# Patient Record
Sex: Male | Born: 1940 | Race: White | Hispanic: No | Marital: Married | Smoking: Former smoker
Health system: Southern US, Community
[De-identification: ages and names within clinical notes are randomized; demographics above are authoritative.]

## PROBLEM LIST (undated history)

## (undated) DIAGNOSIS — R55 Syncope and collapse: Secondary | ICD-10-CM

## (undated) DIAGNOSIS — I5189 Other ill-defined heart diseases: Secondary | ICD-10-CM

## (undated) DIAGNOSIS — C801 Malignant (primary) neoplasm, unspecified: Secondary | ICD-10-CM

## (undated) DIAGNOSIS — I10 Essential (primary) hypertension: Secondary | ICD-10-CM

## (undated) DIAGNOSIS — E785 Hyperlipidemia, unspecified: Secondary | ICD-10-CM

## (undated) DIAGNOSIS — I499 Cardiac arrhythmia, unspecified: Secondary | ICD-10-CM

## (undated) DIAGNOSIS — Z950 Presence of cardiac pacemaker: Secondary | ICD-10-CM

## (undated) DIAGNOSIS — Z85038 Personal history of other malignant neoplasm of large intestine: Secondary | ICD-10-CM

## (undated) DIAGNOSIS — M1990 Unspecified osteoarthritis, unspecified site: Secondary | ICD-10-CM

## (undated) DIAGNOSIS — I495 Sick sinus syndrome: Secondary | ICD-10-CM

## (undated) HISTORY — DX: Syncope and collapse: R55

## (undated) HISTORY — DX: Other ill-defined heart diseases: I51.89

## (undated) HISTORY — DX: Presence of cardiac pacemaker: Z95.0

## (undated) HISTORY — DX: Personal history of other malignant neoplasm of large intestine: Z85.038

## (undated) HISTORY — PX: CATARACT EXTRACTION W/ INTRAOCULAR LENS IMPLANT: SHX1309

## (undated) HISTORY — PX: OTHER SURGICAL HISTORY: SHX169

## (undated) HISTORY — DX: Unspecified osteoarthritis, unspecified site: M19.90

## (undated) HISTORY — DX: Sick sinus syndrome: I49.5

## (undated) HISTORY — DX: Hyperlipidemia, unspecified: E78.5

## (undated) HISTORY — DX: Essential (primary) hypertension: I10

## (undated) HISTORY — PX: INSERT / REPLACE / REMOVE PACEMAKER: SUR710

---

## 1970-05-04 HISTORY — PX: OTHER SURGICAL HISTORY: SHX169

## 1999-05-05 HISTORY — PX: COLOSTOMY: SHX63

## 2000-02-20 ENCOUNTER — Encounter: Admission: RE | Admit: 2000-02-20 | Discharge: 2000-05-20 | Payer: Self-pay

## 2006-08-10 ENCOUNTER — Encounter: Admission: RE | Admit: 2006-08-10 | Discharge: 2006-11-08 | Payer: Self-pay | Admitting: Family Medicine

## 2007-11-02 HISTORY — PX: KNEE ARTHROPLASTY: SHX992

## 2007-11-08 ENCOUNTER — Inpatient Hospital Stay (HOSPITAL_COMMUNITY): Admission: RE | Admit: 2007-11-08 | Discharge: 2007-11-11 | Payer: Self-pay | Admitting: Orthopedic Surgery

## 2007-12-05 ENCOUNTER — Encounter: Admission: RE | Admit: 2007-12-05 | Discharge: 2008-02-03 | Payer: Self-pay | Admitting: Orthopedic Surgery

## 2008-12-04 ENCOUNTER — Ambulatory Visit: Payer: Self-pay | Admitting: Internal Medicine

## 2009-01-03 ENCOUNTER — Ambulatory Visit: Payer: Self-pay | Admitting: Interventional Radiology

## 2009-01-03 ENCOUNTER — Encounter: Payer: Self-pay | Admitting: Emergency Medicine

## 2009-01-04 ENCOUNTER — Encounter (INDEPENDENT_AMBULATORY_CARE_PROVIDER_SITE_OTHER): Payer: Self-pay | Admitting: Internal Medicine

## 2009-01-04 ENCOUNTER — Inpatient Hospital Stay (HOSPITAL_COMMUNITY): Admission: EM | Admit: 2009-01-04 | Discharge: 2009-01-05 | Payer: Self-pay | Admitting: Internal Medicine

## 2009-01-04 ENCOUNTER — Ambulatory Visit: Payer: Self-pay | Admitting: Internal Medicine

## 2009-01-05 ENCOUNTER — Encounter: Payer: Self-pay | Admitting: Internal Medicine

## 2009-01-08 ENCOUNTER — Encounter: Payer: Self-pay | Admitting: Internal Medicine

## 2009-01-17 ENCOUNTER — Ambulatory Visit: Payer: Self-pay | Admitting: Internal Medicine

## 2009-01-17 ENCOUNTER — Ambulatory Visit: Payer: Self-pay

## 2009-01-22 ENCOUNTER — Telehealth (INDEPENDENT_AMBULATORY_CARE_PROVIDER_SITE_OTHER): Payer: Self-pay | Admitting: *Deleted

## 2009-01-23 ENCOUNTER — Ambulatory Visit: Payer: Self-pay

## 2009-01-23 ENCOUNTER — Encounter: Payer: Self-pay | Admitting: Cardiovascular Disease

## 2009-04-16 ENCOUNTER — Ambulatory Visit: Payer: Self-pay | Admitting: Internal Medicine

## 2009-04-16 DIAGNOSIS — I495 Sick sinus syndrome: Secondary | ICD-10-CM | POA: Insufficient documentation

## 2009-11-19 ENCOUNTER — Telehealth: Payer: Self-pay | Admitting: Internal Medicine

## 2010-01-23 ENCOUNTER — Ambulatory Visit: Payer: Self-pay | Admitting: Internal Medicine

## 2010-01-23 DIAGNOSIS — R55 Syncope and collapse: Secondary | ICD-10-CM | POA: Insufficient documentation

## 2010-02-04 ENCOUNTER — Ambulatory Visit: Payer: Self-pay

## 2010-02-04 ENCOUNTER — Ambulatory Visit: Payer: Self-pay | Admitting: Cardiology

## 2010-02-04 ENCOUNTER — Ambulatory Visit (HOSPITAL_COMMUNITY): Admission: RE | Admit: 2010-02-04 | Discharge: 2010-02-04 | Payer: Self-pay | Admitting: Internal Medicine

## 2010-02-04 ENCOUNTER — Encounter: Payer: Self-pay | Admitting: Internal Medicine

## 2010-03-11 ENCOUNTER — Telehealth: Payer: Self-pay | Admitting: Internal Medicine

## 2010-04-24 ENCOUNTER — Ambulatory Visit: Payer: Self-pay | Admitting: Internal Medicine

## 2010-04-29 ENCOUNTER — Encounter: Payer: Self-pay | Admitting: Internal Medicine

## 2010-05-08 ENCOUNTER — Ambulatory Visit
Admission: RE | Admit: 2010-05-08 | Discharge: 2010-05-08 | Payer: Self-pay | Source: Home / Self Care | Attending: Internal Medicine | Admitting: Internal Medicine

## 2010-05-08 ENCOUNTER — Encounter: Payer: Self-pay | Admitting: Internal Medicine

## 2010-05-08 DIAGNOSIS — I10 Essential (primary) hypertension: Secondary | ICD-10-CM | POA: Insufficient documentation

## 2010-06-01 LAB — CONVERTED CEMR LAB
BUN: 22 mg/dL (ref 6–23)
Basophils Absolute: 0 10*3/uL (ref 0.0–0.1)
Basophils Relative: 0.5 % (ref 0.0–3.0)
CO2: 28 meq/L (ref 19–32)
Calcium: 9.6 mg/dL (ref 8.4–10.5)
Chloride: 103 meq/L (ref 96–112)
Creatinine, Ser: 1.1 mg/dL (ref 0.4–1.5)
Eosinophils Absolute: 0.1 10*3/uL (ref 0.0–0.7)
Eosinophils Relative: 2.1 % (ref 0.0–5.0)
GFR calc non Af Amer: 70.96 mL/min (ref >60)
Glucose, Bld: 148 mg/dL — ABNORMAL HIGH (ref 70–99)
HCT: 42.2 % (ref 39.0–52.0)
Hemoglobin: 14.4 g/dL (ref 13.0–17.0)
Lymphocytes Relative: 15.5 % (ref 12.0–46.0)
Lymphs Abs: 1.1 10*3/uL (ref 0.7–4.0)
MCHC: 34.2 g/dL (ref 30.0–36.0)
MCV: 91 fL (ref 78.0–100.0)
Monocytes Absolute: 0.7 10*3/uL (ref 0.1–1.0)
Monocytes Relative: 9.3 % (ref 3.0–12.0)
Neutro Abs: 5.1 10*3/uL (ref 1.4–7.7)
Neutrophils Relative %: 72.6 % (ref 43.0–77.0)
Platelets: 175 10*3/uL (ref 150.0–400.0)
Potassium: 4.1 meq/L (ref 3.5–5.1)
RBC: 4.64 M/uL (ref 4.22–5.81)
RDW: 12.6 % (ref 11.5–14.6)
Sodium: 139 meq/L (ref 135–145)
TSH: 2.19 microintl units/mL (ref 0.35–5.50)
WBC: 7.1 10*3/uL (ref 4.5–10.5)

## 2010-06-03 NOTE — Progress Notes (Signed)
Summary: Calling regarding pacer machine  Phone Note Outgoing Call Call back at [facility] Phone [phone]   Caller: Patient Summary of Call: Pt calling regarding a pacer machine Initial call taken by: Judie Grieve,  November 19, 2009 1:27 PM Summary of Call: SPOKE W/PT --PT NEVER RECEIVED [facility] TRANSMITTER.  ENROLLED PT IN [facility] AND PT SHOULD RECEIVE TRANSMITTER IN NEXT 2 WKS.  PT DUE FOR SK SEPT. PT AWARE. Vella Kohler  November 20, 2009 4:40 PM

## 2010-06-03 NOTE — Assessment & Plan Note (Signed)
Summary: Cardiology Nuclear Study  Nuclear Med Background Indications for Stress Test: Evaluation for Ischemia   History: Echo, Myocardial Perfusion Study, Pacemaker  History Comments: '08 MPS: NL- [facility] Cardiology) 01/04/09 Pacemaker (SSS) 9/10 Echo: EF= 60-65%  Symptoms: Light-Headedness, Palpitations, Syncope    Nuclear Pre-Procedure Cardiac Risk Factors: Family History - CAD, Hypertension, Lipids, NIDDM Caffeine/Decaff Intake: none NPO After: 7:00 PM Lungs: clear IV 0.9% NS with Angio Cath: 22g     IV Site: (R) AC IV Started by: Irean Hong RN Chest Size (in) 44     Height (in): 67 Weight (lb): 195 BMI: 30.65  Nuclear Med Study 1 or 2 day study:  1 day     Stress Test Type:  Adenosine Reading MD:  Charlton Haws, MD     Referring MD:  S.Klein Resting Radionuclide:  Technetium 78m Tetrofosmin     Resting Radionuclide Dose:  11 mCi  Stress Radionuclide:  Technetium 80m Tetrofosmin     Stress Radionuclide Dose:  33 mCi   Stress Protocol  Dose of Adenosine:  49.6 mg    Stress Test Technologist:  Milana Na EMT-P     Nuclear Technologist:  Domenic Polite CNMT  Rest Procedure  Myocardial perfusion imaging was performed at rest 45 minutes following the intraveneous administration of Myoview Technetium 25m Tetrofosmin.  Stress Procedure  The patient received IV adenosine at 140 mcg/kg/min for 4 minutes. 2nd degree AVB with infusion. There were no significant changes and rare pacs with infusion. Myoview was injected at the 2 minute mark and quantitative spect images were obtained after a 45 minute delay.  QPS Raw Data Images:  Normal; no motion artifact; normal heart/lung ratio. Stress Images:  There is normal uptake in all areas. Rest Images:  Normal homogeneous uptake in all areas of the myocardium. Subtraction (SDS):  Normal Transient Ischemic Dilatation:  1.09  (Normal <1.22)  Lung/Heart Ratio:  .32  (Normal <0.45)  Quantitative Gated Spect  Images QGS EDV:  84 ml QGS ESV:  33 ml QGS EF:  60 % QGS cine images:  Normal  Findings Normal nuclear study      Overall Impression  Exercise Capacity: Adenosine study with no exercise. BP Response: Normal blood pressure response. Clinical Symptoms: No chest pain ECG Impression: No significant ST segment change suggestive of ischemia. Overall Impression: Normal stress nuclear study. Overall Impression Comments: Normal  Appended Document: Cardiology Nuclear Study Pt notified of normal results

## 2010-06-03 NOTE — Progress Notes (Signed)
Summary: results of echo-has questions  Phone Note Call from Patient   Caller: Patient [phone] Reason for Call: Talk to Nurse, Lab or Test Results Summary of Call: pt needs results of echo-also primary dr hasn't rec report which pt requested be sent at his appt, [facility] primary care-dr Cordelia Pen [phone], also p has questions re appt with pcp  Initial call taken by: Glynda Jaeger,  March 11, 2010 9:27 AM  Follow-up for Phone Call        fax'd results per pt request, scheduled f/u appt for 1/5 and have prescriptions for pt to pick up for beta blockers to try to control heart rate.  Follow-up by: Claris Gladden RN,  March 11, 2010 9:55 AM    New/Updated Medications: PROPRANOLOL HCL 60 MG TABS (PROPRANOLOL HCL) once daily ATENOLOL 25 MG TABS (ATENOLOL) once daily METOPROLOL SUCCINATE 25 MG XR24H-TAB (METOPROLOL SUCCINATE) once daily Prescriptions: METOPROLOL SUCCINATE 25 MG XR24H-TAB (METOPROLOL SUCCINATE) once daily  #14 x 0   Entered by:   Claris Gladden RN   Authorized by:   Nathen May, MD, [facility]   Signed by:   Claris Gladden RN on 03/11/2010   Method used:   Print then Give to Patient   RxID:   [id number] PROPRANOLOL HCL 60 MG TABS (PROPRANOLOL HCL) once daily  #14 x 0   Entered by:   Claris Gladden RN   Authorized by:   Nathen May, MD, [facility]   Signed by:   Claris Gladden RN on 03/11/2010   Method used:   Print then Give to Patient   RxID:   [id number] ATENOLOL 25 MG TABS (ATENOLOL) once daily  #14 x 0   Entered by:   Claris Gladden RN   Authorized by:   Nathen May, MD, [facility]   Signed by:   Claris Gladden RN on 03/11/2010   Method used:   Print then Give to Patient   RxID:   [id number]

## 2010-06-03 NOTE — Cardiovascular Report (Signed)
Summary: Office Visit   Office Visit   Imported By: Roderic Ovens 01/28/2010 15:08:09  _____________________________________________________________________  External Attachment:    Type:   Image     Comment:   External Document

## 2010-06-03 NOTE — Assessment & Plan Note (Signed)
Summary: 9 MO F/U   Visit Type:  Follow-up Primary Provider:  Nathen May, MD, [facility]   History of Present Illness: Joshua Ali is seen following pacemaker implantation earlier this fall because of syncope with documented sinus arrest of greater than 30 seconds.     He had undergone echo at that time demonstrating 1. Left ventricle: Systolic function was normal. The estimated      ejection fraction was in the range of 60% to 65%.  The patient denies SOB, chest pain, edema or palpitations He has done very well since then. He denies any specific palpitations last Thursday (see below) and a couple of twitches but overall things are doing very well  He denies diaphoresis wt loss or alopecia.  His exercise tolerance remains imporved  Current Medications (verified): 1)  Metformin Hcl 1000 Mg Tabs (Metformin Hcl) .... One By Mouth Bid 2)  Hyzaar 100-25 Mg Tabs (Losartan Potassium-Hctz) .... One By Mouth Daily 3)  Simvastatin 40 Mg Tabs (Simvastatin) .... On By Mouth Daily 4)  Mobic 15 Mg Tabs (Meloxicam) .... One By Mouth Daily 5)  Aspir-Low 81 Mg Tbec (Aspirin) .... One By Mouth Daily 6)  Fish Oil 1000 Mg Caps (Omega-3 Fatty Acids) .... One By Mouth Daily 7)  Caltrate 600+d 600-400 Mg-Unit Tabs (Calcium Carbonate-Vitamin D) .... One  By Mouth Daily 8)  Zyrtec Allergy 10 Mg Tabs (Cetirizine Hcl) .... Once Daily 9)  Flonase 50 Mcg/act Susp (Fluticasone Propionate) .... Uad 10)  Hydrocodone-Acetaminophen 7.5-750 Mg Tabs (Hydrocodone-Acetaminophen) .... Uad 11)  Eql Vitamin D3 1000 Unit Tabs (Cholecalciferol) .... Once Daily 12)  Multivitamins   Tabs (Multiple Vitamin) .... Once Daily  Allergies (verified): 1)  ! Percocet  Past History:  Past Medical History: Last updated: 04/15/2009 Syncope Sick sinus syndrome Status post dual-chamber permanent pacemaker implant.  [employer] DR Satira Sark. Jude pulse   generator model PMS 2110 Preserved ejection fraction of 60-65%-2010 Diabetes type  2 Hyperlipidemia History of arthritis History of hypertension History of colon cancer approximately 10 years ago Chronic diastolic dysfunction  Vital Signs:  Patient profile:   70 year old male Height:      67 inches Weight:      192 pounds BMI:     30.18 Pulse rate:   95 / minute BP sitting:   136 / 86  (left arm)  Vitals Entered By: Joshua Ali CMA (January 23, 2010 2:41 PM)  Physical Exam  General:  The patient was alert and oriented in no acute distress. HEENT Normal.  Neck veins were flat, carotids were brisk.  Lungs were clear.  Heart sounds were regular but rapid without murmurs or gallops.  Abdomen was soft with active bowel sounds. There is no clubbing cyanosis or edema. Skin Warm and dry    PPM Specifications Following MD:  Sherryl Manges, MD     PPM Vendor:  St Jude     PPM Model Number:  [id number]     PPM Serial Number:  [id number] DOI:  01/04/2009     PPM Implanting MD:  Sherryl Manges, MD  Lead 1    Location: RA     DOI: 01/04/2009     Model #: [id number]     Serial #: [id number]     Status: active Lead 2    Location: RV     DOI: 01/04/2009     Model #: [id number]     Serial #: [id number]     Status: active  Magnet Response Rate:  BOL 100 ERI  85    PPM Follow Up Battery Voltage:  2.98 V     Battery Est. Longevity:  10.1-11.0 yrs     Pacer Dependent:  No       PPM Device Measurements Atrium  Amplitude: 5.0 mV, Impedance: 430 ohms, Threshold: 0.375 V at 0.5 msec Right Ventricle  Amplitude: 12.0 mV, Impedance: 530 ohms, Threshold: 0.875 V at 0.5 msec  Episodes MS Episodes:  25     Percent Mode Switch:  <1%     Coumadin:  No Ventricular High Rate:  49     Atrial Pacing:  <1%     Ventricular Pacing:  <1%  Parameters Mode:  DDD     Lower Rate Limit:  60     Upper Rate Limit:  115 Paced AV Delay:  225     Sensed AV Delay:  225 Next Remote Date:  04/24/2010     Next Cardiology Appt Due:  01/05/2011 Tech Comments:  25 AMS EPISODES--LONGEST WAS 3 HRS 45 MINUTES. 49 VHR  EPISODES.  NORMAL DEVICE FUNCTION.  NO CHANGES MADE.  MERLIN TRANSMISSION 04-24-10. ROV IN 12 MTHS W/SK. Vella Kohler  January 23, 2010 3:25 PM  Impression & Recommendations:  Problem # 1:  SINUS NODE DYSFUNCTION (ICD-427.81) The patient has a rapid sinus rate today. His mean rate is 90 beats per minute. Review of the data from a year ago was that his heart rate was 68. Nursing records from a year ago had one heart rate in the 102 and the others were in the 70s.  The issue is why does he have sinus tachycardia. Will check his thyroid white count and metabolic profile. Will also undertake an echo to look to see if there is any consequences manifested as LV dysfunction or potentially identifiable activity caused to the tachycardia His updated medication list for this problem includes:    Aspir-low 81 Mg Tbec (Aspirin) ..... 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 His updated medication list for this problem includes:    Aspir-low 81 Mg Tbec (Aspirin) ..... One by mouth daily  Problem # 4:  VENTRICULAR TACHYCARDIA (ICD-427.1) ventricular high rate episodes alternatively sinus rhythm. I should note that there is a unusual variation and yellow to him of the atrial signal seen at various rates. Is not sufficiently irregular to be suggestive of MAT His updated medication list for this problem includes:    Aspir-low 81 Mg Tbec (Aspirin) ..... One by mouth daily  Orders: TLB-BMP (Basic Metabolic  Panel-BMET) (80048-METABOL) TLB-CBC Platelet - w/Differential (85025-CBCD) TLB-TSH (Thyroid Stimulating Hormone) (16109-UEA)  Patient Instructions: 1)  Your physician recommends that you have lab work today: TSH, BMET, & CBC.

## 2010-06-03 NOTE — Assessment & Plan Note (Signed)
Summary: PC2/KFW   History of Present Illness: [name] is seen following pacemaker implantation earlier this fall because of syncope with documented sinus arrest of greater than 30 seconds.  He had undergone echo at that time demonstrating 1. Left ventricle: Systolic function was normal. The estimated      ejection fraction was in the range of 60% to 65%.  The patient denies SOB, chest pain, edema or palpitations He has done very well since then. He denies any specific palpitations last Thursday (see below) and a couple of twitches but overall things are doing very well   Current Medications (verified): 1)  Metformin Hcl 1000 Mg Tabs (Metformin Hcl) .... One By Mouth Bid 2)  Hyzaar 100-25 Mg Tabs (Losartan Potassium-Hctz) .... One By Mouth Daily 3)  Simvastatin 40 Mg Tabs (Simvastatin) .... On By Mouth Daily 4)  Mobic 15 Mg Tabs (Meloxicam) .... One By Mouth Daily 5)  Aspir-Low 81 Mg Tbec (Aspirin) .... One By Mouth Daily 6)  Fish Oil 1000 Mg Caps (Omega-3 Fatty Acids) .... One By Mouth Daily 7)  Caltrate 600+d 600-400 Mg-Unit Tabs (Calcium Carbonate-Vitamin D) .... One  By Mouth Daily 8)  Claritin-D 24 Hour 10-240 Mg Xr24h-Tab (Loratadine-Pseudoephedrine) .... One By Mouth Daily  Allergies (verified): No Known Drug Allergies  Past History:  Past Medical History: Last updated: 04/15/2009 Syncope Sick sinus syndrome Status post dual-chamber permanent pacemaker implant.  Acc[employer] DR Satira Sark. Jude pulse   generator model PMS 2110 Preserved ejection fraction of 60-65%-2010 Diabetes type 2 Hyperlipidemia History of arthritis History of hypertension History of colon cancer approximately 10 years ago Chronic diastolic dysfunction  Past Surgical History: Last updated: 04/15/2009 Right total knee replacement permanent pacemaker implant-[employer] DR St. Jude pulse   generator model PMS 2110  Social History: Last updated: 04/15/2009 Married  Tobacco Use - No.  Alcohol Use - no Drug Use  - no  Vital Signs:  Patient profile:   70 year old male Height:      67 inches Weight:      196 pounds BMI:     30.81 Pulse rate:   68 / minute Pulse rhythm:   regular BP sitting:   162 / 100  (left arm) Cuff size:   regular  Vitals Entered By: Judithe Modest CMA (April 16, 2009 10:41 AM)  Physical Exam  General:  The patient was alert and oriented in no acute distress. HEENT Normal.  Neck veins were flat, carotids were brisk.  Lungs were clear.  Heart sounds were regular without murmurs or gallops.  Abdomen was soft with active bowel sounds. There is no clubbing cyanosis or edema. Skin Warm and dry Device pocket is well-healed   PPM Specifications Following MD:  Sherryl Manges, MD     PPM Vendor:  St Jude     PPM Model Number:  [id number]     PPM Serial Number:  [id number] DOI:  01/04/2009     PPM Implanting MD:  Sherryl Manges, MD  Lead 1    Location: RA     DOI: 01/04/2009     Model #: [id number]     Serial #: [id number]     Status: active Lead 2    Location: RV     DOI: 01/04/2009     Model #: [id number]     Serial #: [id number]     Status: active  Magnet Response Rate:  BOL 100 ERI  85    PPM Follow Up Remote Check?  No Battery  Voltage:  2.99 V     Battery Est. Longevity:  11.9 YEARS     Pacer Dependent:  No       PPM Device Measurements Atrium  Amplitude: 5 mV, Impedance: 480 ohms, Threshold: 0.5 V at 0.5 msec Right Ventricle  Amplitude: 12 mV, Impedance: 550 ohms, Threshold: 0.75 V at 0.5 msec  Episodes MS Episodes:  13     Percent Mode Switch:  <1%     Coumadin:  No Ventricular High Rate:  3     Atrial Pacing:  <1%     Ventricular Pacing:  <1%  Parameters Mode:  DDD     Lower Rate Limit:  60     Upper Rate Limit:  115 Paced AV Delay:  225     Sensed AV Delay:  225 Next Cardiology Appt Due:  01/02/2010 Tech Comments:  Normal device function.  No changes made today.  RA and RV autocapture on.  3 VHR episodes, 2 are likley SVT, 1 is NSVT- 20 beats at . Mode switch  episodes are 1:1 atrial tachycardia.  No changes made today.  ROV 9-11 SK. 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 There have however been no recurrent syncopal episodes. Exercise tolerance good continue current meds His updated medication list for this problem includes:    Aspir-low 81 Mg Tbec (Aspirin) ..... 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 There are no symptoms associated with this episode. We'll continue to watch it His updated medication list for this problem includes:    Aspir-low 81 Mg Tbec (Aspirin) ..... One by mouth daily  Patient Instructions: 1)  Your physician recommends that you schedule a follow-up appointment in: 9 MONTHS

## 2010-06-03 NOTE — Progress Notes (Signed)
Summary: Nuclear Pre-Procedure  Phone Note Outgoing Call Call back at [facility] Phone [phone]   Call placed by: Stanton Kidney, EMT-P,  January 22, 2009 2:39 PM Action Taken: Phone Call Completed Summary of Call: Left message with information on Myoview Information Sheet (see scanned document for details).     Nuclear Med Background Indications for Stress Test: Evaluation for Ischemia   History: Echo, Myocardial Perfusion Study, Pacemaker  History Comments: '08 MPS: NL- [facility] Cardiology) 01/04/09 Pacemaker (SSS) 9/10 Echo: EF= 60-65%  Symptoms: Light-Headedness, Syncope    Nuclear Pre-Procedure Cardiac Risk Factors: Family History - CAD, Hypertension, Lipids, NIDDM

## 2010-06-03 NOTE — Cardiovascular Report (Signed)
Summary: Office Visit  Office Visit   Imported By: Roderic Ovens 01/31/2009 16:21:40  _____________________________________________________________________  External Attachment:    Type:   Image     Comment:   External Document

## 2010-06-03 NOTE — Assessment & Plan Note (Signed)
Summary: Patient Instructions    Patient Instructions: 1)  Your physician has requested that you have an echocardiogram.  Echocardiography is a painless test that uses sound waves to create images of your heart. It provides your doctor with information about the size and shape of your heart and how well your heart's chambers and valves are working.  This procedure takes approximately one hour. There are no restrictions for this procedure. 2)  Your physician recommends that you have TSH, CBC, BMET today. 3)  Your physician recommends that you continue on your current medications as directed. Please refer to the Current Medication list given to you today. 4)  Your physician wants you to follow-up in: 1 year   You will receive a reminder letter in the mail two months in advance. If you don't receive a letter, please call our office to schedule the follow-up appointment.

## 2010-06-03 NOTE — Procedures (Signed)
Summary: [facility]   Current Medications (verified): 1)  Metformin Hcl 1000 Mg Tabs (Metformin Hcl) .... One By Mouth Bid 2)  Hyzaar 100-25 Mg Tabs (Losartan Potassium-Hctz) .... One By Mouth Daily 3)  Simvastatin 40 Mg Tabs (Simvastatin) .... On By Mouth Daily 4)  Mobic 15 Mg Tabs (Meloxicam) .... One By Mouth Daily 5)  Aspir-Low 81 Mg Tbec (Aspirin) .... One By Mouth Daily 6)  Fish Oil 1000 Mg Caps (Omega-3 Fatty Acids) .... One By Mouth Daily 7)  Caltrate 600+d 600-400 Mg-Unit Tabs (Calcium Carbonate-Vitamin D) .... One  By Mouth Daily 8)  Claritin-D 24 Hour 10-240 Mg Xr24h-Tab (Loratadine-Pseudoephedrine) .... One By Mouth Daily  Allergies (verified): No Known Drug Allergies  PPM Specifications Following MD:  Sherryl Manges, MD     PPM Vendor:  St Jude     PPM Model Number:  [phone]     PPM Serial Number:  [id number] PPM DOI:  01/04/2009     PPM Implanting MD:  Sherryl Manges, MD  Lead 1    Location: RA     DOI: 01/04/2009     Model #: [id number]     Serial #: [id number]     Status: active Lead 2    Location: RV     DOI: 01/04/2009     Model #: [id number]     Serial #: [id number]     Status: active  Magnet Response Rate:  BOL 100 ERI  85    PPM Follow Up Remote Check?  No Battery Voltage:  3.05 V     Battery Est. Longevity:  12.2 years     Pacer Dependent:  Yes       PPM Device Measurements Atrium  Amplitude: >5 mV, Impedance: 530 ohms, Threshold: 0.5 V at 0.5 msec Right Ventricle  Amplitude: >12 mV, Impedance: 630 ohms, Threshold: 0.62 V at 0.5 msec  Episodes MS Episodes:  0     Percent Mode Switch:  0     Coumadin:  No Ventricular High Rate:  0     Atrial Pacing:  <1%     Ventricular Pacing:  <1%  Parameters Mode:  DDD     Lower Rate Limit:  60     Upper Rate Limit:  115 Paced AV Delay:  225     Sensed AV Delay:  225 Next Cardiology Appt Due:  04/03/2009 Tech Comments:  No parameter changes.   Steri strips removed, no redness or edema.   He will follow up in December with Dr.  Graciela Husbands. Altha Harm, LPN  January 17, 2009 12:36 PM

## 2010-06-05 NOTE — Assessment & Plan Note (Signed)
Summary: rov-   Primary Provider:  Nathen May, MD, [facility]   History of Present Illness: Joshua Ali is seen following pacemaker implantation earlier this fall because of syncope with documented sinus arrest of greater than 30 seconds.  His had no subsequent syncope.   He had undergone echo at that time demonstrating 1. Left ventricle: Systolic function was normal. The estimated      ejection fraction was in the range of 60% to 65%.  The patient denies SOB, chest pain, edema or palpitations He has done very well since then. He denies any specific palpitations last Thursday (see below) and a couple of twitches but overall things are doing very well He was last seen sinus tachycardia was noted. Evaluation included TSH that was normal CBC that was normal an echo that also showed normal left ventricular function.  We undertook a beta blocker trial including metoprolol succinate, atenolol, and Inderal. The first had h no associated side effects  Current Medications (verified): 1)  Metformin Hcl 1000 Mg Tabs (Metformin Hcl) .... One By Mouth Bid 2)  Hyzaar 100-25 Mg Tabs (Losartan Potassium-Hctz) .... One By Mouth Daily 3)  Simvastatin 40 Mg Tabs (Simvastatin) .... On By Mouth Daily 4)  Mobic 15 Mg Tabs (Meloxicam) .... One By Mouth Daily 5)  Aspir-Low 81 Mg Tbec (Aspirin) .... One By Mouth Daily 6)  Fish Oil 1000 Mg Caps (Omega-3 Fatty Acids) .... One By Mouth Daily 7)  Caltrate 600+d 600-400 Mg-Unit Tabs (Calcium Carbonate-Vitamin D) .... One  By Mouth Daily 8)  Zyrtec Allergy 10 Mg Tabs (Cetirizine Hcl) .... Once Daily 9)  Flonase 50 Mcg/act Susp (Fluticasone Propionate) .... Uad 10)  Hydrocodone-Acetaminophen 7.5-750 Mg Tabs (Hydrocodone-Acetaminophen) .... Uad 11)  Eql Vitamin D3 1000 Unit Tabs (Cholecalciferol) .... Once Daily 12)  Multivitamins   Tabs (Multiple Vitamin) .... Once Daily  Allergies (verified): 1)  ! Percocet  Past History:  Past Medical History: Last  updated: 04/15/2009 Syncope Sick sinus syndrome Status post dual-chamber permanent pacemaker implant.  Accent DR Satira Sark. Jude pulse   generator model PMS 2110 Preserved ejection fraction of 60-65%-2010 Diabetes type 2 Hyperlipidemia History of arthritis History of hypertension History of colon cancer approximately 10 years ago Chronic diastolic dysfunction  Past Surgical History: Last updated: 04/15/2009 Right total knee replacement permanent pacemaker implant-Accent DR St. Jude pulse   generator model PMS 2110  Social History: Last updated: 04/15/2009 Married  Tobacco Use - No.  Alcohol Use - no Drug Use - no  Vital Signs:  Patient profile:   70 year old male Height:      67 inches Weight:      190 pounds BMI:     29.87 Pulse rate:   92 / minute Resp:     16 per minute BP sitting:   154 / 98  (right arm)  Vitals Entered By: Marrion Coy, CNA (May 08, 2010 12:47 PM)  Physical Exam  General:  The patient was alert and oriented in no acute distress. HEENT Normal.  Neck veins were flat, carotids were brisk.  Lungs were clear.  Heart sounds were regular without murmurs or gallops.  Abdomen was soft with active bowel sounds. There is no clubbing cyanosis or edema. Skin Warm and dry    PPM Specifications Following MD:  Sherryl Manges, MD     PPM Vendor:  St Jude     PPM Model Number:  [id number]     PPM Serial Number:  [id number] PPM DOI:  01/04/2009     PPM Implanting MD:  Sherryl Manges, MD  Lead 1    Location: RA     DOI: 01/04/2009     Model #: [id number]     Serial #: [id number]     Status: active Lead 2    Location: RV     DOI: 01/04/2009     Model #: [id number]     Serial #: [id number]     Status: active  Magnet Response Rate:  BOL 100 ERI  85    PPM Follow Up Remote Check?  No Battery Voltage:  2.96 V     Battery Est. Longevity:  9.7 years     Pacer Dependent:  No       PPM Device Measurements Atrium  Amplitude: 5.0 mV, Impedance: 480 ohms, Threshold: 0.5 V at 0.5  msec Right Ventricle  Amplitude: 12 mV, Impedance: 490 ohms, Threshold: 0.75 V at 0.5 msec  Episodes MS Episodes:  3     Percent Mode Switch:  <1%     Coumadin:  No Ventricular High Rate:  8     Atrial Pacing:  <1%     Ventricular Pacing:  <1%  Parameters Mode:  DDD     Lower Rate Limit:  60     Upper Rate Limit:  115 Paced AV Delay:  225     Sensed AV Delay:  225 Tech Comments:  No parameter changes.  [employer] transmissions every 3 months.  ROV 1 year with Dr. Graciela Husbands. Altha Harm, LPN  May 08, 2010 2:06 PM   Impression & Recommendations:  Problem # 1:  SYNCOPE (ICD-780.2)  no recurrent syncope  The following medications were removed from the medication list:    Propranolol Hcl 60 Mg Tabs (Propranolol hcl) ..... Once daily    Atenolol 25 Mg Tabs (Atenolol) ..... Once daily    Metoprolol Succinate 25 Mg Xr24h-tab (Metoprolol succinate) ..... Once daily His updated medication list for this problem includes:    Aspir-low 81 Mg Tbec (Aspirin) ..... One by mouth daily  Problem # 2:  SINUS NODE DYSFUNCTION (ICD-427.81) distal sinus tachycardia and sinus node dysfunction. We'll begin him on metoprolol succinate as it was the best tolerated of the medications  Orders: EKG w/ Interpretation (93000)  Problem # 3:  HYPERTENSION, BENIGN (ICD-401.1)  He has significant hypertension. We'll plan to use his beta blocker antihypertensive therapy. He is scheduled to see his primary care physician in number of weeks for his annual physical. We will forward this note to him.  The following medications were removed from the medication list:    Propranolol Hcl 60 Mg Tabs (Propranolol hcl) ..... Once daily    Atenolol 25 Mg Tabs (Atenolol) ..... Once daily    Metoprolol Succinate 25 Mg Xr24h-tab (Metoprolol succinate) ..... Once daily His updated medication list for this problem includes:    Hyzaar 100-25 Mg Tabs (Losartan potassium-hctz) ..... One by mouth daily    Aspir-low 81 Mg Tbec  (Aspirin) ..... One by mouth daily    Metoprolol Succinate 50 Mg Xr24h-tab (Metoprolol succinate) ..... Use as directed    Metoprolol Succinate 50 Mg Xr24h-tab (Metoprolol succinate) ..... Use as directed  Patient Instructions: 1)  Your physician has recommended you make the following change in your medication: Toprol 50mg  take as directed 2)  Your physician wants you to follow-up in: Sept 2012  You will receive a reminder letter in the mail two months in advance. If you don't receive a  letter, please call our office to schedule the follow-up appointment. Prescriptions: METOPROLOL SUCCINATE 50 MG XR24H-TAB (METOPROLOL SUCCINATE) use as directed  #90 x 3   Entered by:   Claris Gladden RN   Authorized by:   Nathen May, MD, [facility]   Signed by:   Claris Gladden RN on 05/08/2010   Method used:   Electronically to        [facility]  [address] [phone]* (retail)       [address]       [place], [place]  [address]       Ph: [phone] or [phone]       Fax: [phone]   RxID:   [id number]

## 2010-06-05 NOTE — Letter (Signed)
Summary: Device-Delinquent Phone Journalist, newspaper, Main Office  [address]   [place], [place] [address]   Phone: [phone]  Fax: [phone]     April 29, 2010 MRN: [id number]   SIVAN QUAST [address] [place], [place]  [address]   Dear Mr. VESTER,  According to our records, you were scheduled for a device phone transmission on 04-24-2010.     We did not receive any results from this check.  If you transmitted on your scheduled day, please call us to help troubleshoot your system.  If you forgot to send your transmission, please send one upon receipt of this letter.  Thank you,   [facility]

## 2010-08-08 LAB — GLUCOSE, CAPILLARY
Glucose-Capillary: 112 mg/dL — ABNORMAL HIGH (ref 70–99)
Glucose-Capillary: 117 mg/dL — ABNORMAL HIGH (ref 70–99)
Glucose-Capillary: 119 mg/dL — ABNORMAL HIGH (ref 70–99)
Glucose-Capillary: 130 mg/dL — ABNORMAL HIGH (ref 70–99)
Glucose-Capillary: 179 mg/dL — ABNORMAL HIGH (ref 70–99)
Glucose-Capillary: 209 mg/dL — ABNORMAL HIGH (ref 70–99)

## 2010-08-08 LAB — CBC
HCT: 35.8 % — ABNORMAL LOW (ref 39.0–52.0)
HCT: 35.8 % — ABNORMAL LOW (ref 39.0–52.0)
Hemoglobin: 12.5 g/dL — ABNORMAL LOW (ref 13.0–17.0)
Hemoglobin: 12.8 g/dL — ABNORMAL LOW (ref 13.0–17.0)
MCHC: 35 g/dL (ref 30.0–36.0)
MCHC: 35.7 g/dL (ref 30.0–36.0)
MCV: 89.9 fL (ref 78.0–100.0)
MCV: 88.4 fL (ref 78.0–100.0)
Platelets: 161 10*3/uL (ref 150–400)
Platelets: 140 10*3/uL — ABNORMAL LOW (ref 150–400)
RBC: 3.98 MIL/uL — ABNORMAL LOW (ref 4.22–5.81)
RBC: 4.05 MIL/uL — ABNORMAL LOW (ref 4.22–5.81)
RDW: 12.4 % (ref 11.5–15.5)
RDW: 11.9 % (ref 11.5–15.5)
WBC: 6.5 10*3/uL (ref 4.0–10.5)
WBC: 7.8 10*3/uL (ref 4.0–10.5)

## 2010-08-08 LAB — DIFFERENTIAL
Basophils Absolute: 0 10*3/uL (ref 0.0–0.1)
Basophils Absolute: 0 10*3/uL (ref 0.0–0.1)
Basophils Relative: 0 % (ref 0–1)
Basophils Relative: 1 % (ref 0–1)
Eosinophils Absolute: 0 10*3/uL (ref 0.0–0.7)
Eosinophils Absolute: 0 10*3/uL (ref 0.0–0.7)
Eosinophils Relative: 0 % (ref 0–5)
Eosinophils Relative: 0 % (ref 0–5)
Lymphocytes Relative: 9 % — ABNORMAL LOW (ref 12–46)
Lymphocytes Relative: 8 % — ABNORMAL LOW (ref 12–46)
Lymphs Abs: 0.6 10*3/uL — ABNORMAL LOW (ref 0.7–4.0)
Lymphs Abs: 0.6 10*3/uL — ABNORMAL LOW (ref 0.7–4.0)
Monocytes Absolute: 0.5 10*3/uL (ref 0.1–1.0)
Monocytes Absolute: 0.3 10*3/uL (ref 0.1–1.0)
Monocytes Relative: 7 % (ref 3–12)
Monocytes Relative: 4 % (ref 3–12)
Neutro Abs: 5.4 10*3/uL (ref 1.7–7.7)
Neutro Abs: 6.9 10*3/uL (ref 1.7–7.7)
Neutrophils Relative %: 84 % — ABNORMAL HIGH (ref 43–77)
Neutrophils Relative %: 87 % — ABNORMAL HIGH (ref 43–77)

## 2010-08-08 LAB — BASIC METABOLIC PANEL
BUN: 15 mg/dL (ref 6–23)
BUN: 17 mg/dL (ref 6–23)
CO2: 26 mEq/L (ref 19–32)
CO2: 28 mEq/L (ref 19–32)
Calcium: 8.7 mg/dL (ref 8.4–10.5)
Calcium: 9.6 mg/dL (ref 8.4–10.5)
Chloride: 103 mEq/L (ref 96–112)
Chloride: 107 mEq/L (ref 96–112)
Creatinine, Ser: 0.81 mg/dL (ref 0.4–1.5)
Creatinine, Ser: 0.92 mg/dL (ref 0.4–1.5)
GFR calc Af Amer: >60 mL/min (ref >60)
GFR calc Af Amer: >60 mL/min (ref >60)
GFR calc non Af Amer: >60 mL/min (ref >60)
GFR calc non Af Amer: >60 mL/min (ref >60)
Glucose, Bld: 136 mg/dL — ABNORMAL HIGH (ref 70–99)
Glucose, Bld: 137 mg/dL — ABNORMAL HIGH (ref 70–99)
Potassium: 3.7 mEq/L (ref 3.5–5.1)
Potassium: 3.9 mEq/L (ref 3.5–5.1)
Sodium: 138 mEq/L (ref 135–145)
Sodium: 139 mEq/L (ref 135–145)

## 2010-08-08 LAB — HEMOGLOBIN A1C
Hgb A1c MFr Bld: 7.1 % — ABNORMAL HIGH (ref 4.6–6.1)
Hgb A1c MFr Bld: 7.2 % — ABNORMAL HIGH (ref 4.6–6.1)
Mean Plasma Glucose: 157 mg/dL
Mean Plasma Glucose: 160 mg/dL

## 2010-08-08 LAB — URINE CULTURE
Colony Count: NO GROWTH
Culture: NO GROWTH

## 2010-08-08 LAB — COMPREHENSIVE METABOLIC PANEL
ALT: 42 U/L (ref 0–53)
ALT: 40 U/L (ref 0–53)
AST: 29 U/L (ref 0–37)
AST: 36 U/L (ref 0–37)
Albumin: 4 g/dL (ref 3.5–5.2)
Albumin: 4.1 g/dL (ref 3.5–5.2)
Alkaline Phosphatase: 51 U/L (ref 39–117)
Alkaline Phosphatase: 68 U/L (ref 39–117)
BUN: 18 mg/dL (ref 6–23)
BUN: 24 mg/dL — ABNORMAL HIGH (ref 6–23)
CO2: 28 mEq/L (ref 19–32)
CO2: 24 mEq/L (ref 19–32)
Calcium: 9.1 mg/dL (ref 8.4–10.5)
Calcium: 9.3 mg/dL (ref 8.4–10.5)
Chloride: 103 mEq/L (ref 96–112)
Chloride: 102 mEq/L (ref 96–112)
Creatinine, Ser: 0.95 mg/dL (ref 0.4–1.5)
Creatinine, Ser: 0.9 mg/dL (ref 0.4–1.5)
GFR calc Af Amer: >60 mL/min (ref >60)
GFR calc Af Amer: >60 mL/min (ref >60)
GFR calc non Af Amer: >60 mL/min (ref >60)
GFR calc non Af Amer: >60 mL/min (ref >60)
Glucose, Bld: 164 mg/dL — ABNORMAL HIGH (ref 70–99)
Glucose, Bld: 240 mg/dL — ABNORMAL HIGH (ref 70–99)
Potassium: 3.6 mEq/L (ref 3.5–5.1)
Potassium: 4.4 mEq/L (ref 3.5–5.1)
Sodium: 138 mEq/L (ref 135–145)
Sodium: 138 mEq/L (ref 135–145)
Total Bilirubin: 1 mg/dL (ref 0.3–1.2)
Total Bilirubin: 0.5 mg/dL (ref 0.3–1.2)
Total Protein: 6.5 g/dL (ref 6.0–8.3)
Total Protein: 6.5 g/dL (ref 6.0–8.3)

## 2010-08-08 LAB — POCT CARDIAC MARKERS
CKMB, poc: <1 ng/mL — ABNORMAL LOW (ref 1.0–8.0)
Myoglobin, poc: 51.7 ng/mL (ref 12–200)
Troponin i, poc: <0.05 ng/mL (ref 0.00–0.09)

## 2010-08-08 LAB — URINALYSIS, ROUTINE W REFLEX MICROSCOPIC
Bilirubin Urine: NEGATIVE
Glucose, UA: 500 mg/dL — AB
Hgb urine dipstick: NEGATIVE
Ketones, ur: NEGATIVE mg/dL
Nitrite: NEGATIVE
Protein, ur: NEGATIVE mg/dL
Specific Gravity, Urine: 1.022 (ref 1.005–1.030)
Urobilinogen, UA: 0.2 mg/dL (ref 0.0–1.0)
pH: 5.5 (ref 5.0–8.0)

## 2010-08-08 LAB — PROTIME-INR
INR: 1 (ref 0.00–1.49)
Prothrombin Time: 13.2 seconds (ref 11.6–15.2)

## 2010-08-08 LAB — TROPONIN I
Troponin I: 0.01 ng/mL (ref 0.00–0.06)
Troponin I: 0.02 ng/mL (ref 0.00–0.06)

## 2010-08-08 LAB — LIPID PANEL
Cholesterol: 123 mg/dL (ref 0–200)
HDL: 36 mg/dL — ABNORMAL LOW (ref >39)
LDL Cholesterol: 68 mg/dL (ref 0–99)
Total CHOL/HDL Ratio: 3.4 RATIO
Triglycerides: 96 mg/dL (ref <150)
VLDL: 19 mg/dL (ref 0–40)

## 2010-08-08 LAB — APTT: aPTT: 32 seconds (ref 24–37)

## 2010-08-08 LAB — CK TOTAL AND CKMB (NOT AT ARMC)
CK, MB: 1.6 ng/mL (ref 0.3–4.0)
CK, MB: 1.9 ng/mL (ref 0.3–4.0)
Relative Index: INVALID (ref 0.0–2.5)
Relative Index: INVALID (ref 0.0–2.5)
Total CK: 86 U/L (ref 7–232)
Total CK: 87 U/L (ref 7–232)

## 2010-08-08 LAB — PHOSPHORUS: Phosphorus: 3.2 mg/dL (ref 2.3–4.6)

## 2010-08-08 LAB — PROLACTIN: Prolactin: 5.7 ng/mL (ref 2.1–17.1)

## 2010-08-08 LAB — MAGNESIUM: Magnesium: 2.1 mg/dL (ref 1.5–2.5)

## 2010-08-14 ENCOUNTER — Encounter: Payer: Self-pay | Admitting: *Deleted

## 2010-08-19 ENCOUNTER — Encounter: Payer: Self-pay | Admitting: *Deleted

## 2010-09-16 NOTE — Op Note (Signed)
NAME:  Joshua Ali, Joshua Ali                 ACCOUNT NO.:  [id number]   MEDICAL RECORD NO.:  [id number]          PATIENT TYPE:  INP   LOCATION:  5018                         FACILITY:  [facility]   PHYSICIAN:  Burnard Bunting, M.D.    DATE OF BIRTH:  October 17, 1940   DATE OF PROCEDURE:  DATE OF DISCHARGE:                               OPERATIVE REPORT   PREOPERATIVE DIAGNOSIS:  Right knee arthritis.   POSTOPERATIVE DIAGNOSIS:  Right knee arthritis.   PROCEDURE:  Right total knee replacement.   SURGEON:  Burnard Bunting, MD   ASSISTANT:  Jerolyn Shin. Lavender, MD   ANESTHESIA:  General endotracheal.   ESTIMATED BLOOD LOSS:  100 mL.   DRAINS:  Hemovac x1.   INDICATIONS:  Joshua Ali is a 70 year old patient with right knee  arthritis who presents for operative management after failure of  conservative management and explanation of risks and benefits.   COMPONENTS:  DePuy 5 femur, 4 tibia, 10 poly.  The patient had a  previous patellectomy and therefore patella was not placed.  No evidence  of actual remote infection was present in the tissues within the knee.   PROCEDURE IN DETAIL:  The patient was brought to the operating room  where general endotracheal anesthesia was achieved.  Preoperatively  antibiotics was administered.  A time-out was called.  The right leg and  foot was prescrubbed with a alcohol and Betadine and allowed to air dry  then prepped with DuraPrep solution.  Joshua Ali was used to cover the  operative field.  The leg was exsanguinated and examined in the Esmarch  wrap.  The tourniquet was inflated over a bolster in the anterior  portion of the Z-line.  The skin and subcutaneous tissue were sharply  divided.  Median parapatellar approach to the knee was used.  Precise  location was marked with 0 Vicryl suture.  The patellar sleeve tissue  was everted.  At this time, the lateral patellofemoral ligament was  released.  The soft tissue was elevated off the proximal medial tibia.  ACL and  PCL were released.  At this time, two pins were placed in  bicortical fashion through separate stab incisions in the distal medial  femur and proximal medial tibia.  Bicortical contact was achieved.  Registration points for computer navigation were then obtained using  including the hips in rotation, bimalleolar access, and various points  around the knee.  Tibial cut was made resecting 10 mm off the high side,  6 mm off the low side, perpendicular mechanical access of the tibia with  the collaterals and posterior structures were well protected.  At this  time, attention was placed in full extension and flexion and distal  femoral cut was made and checked with spacer blocks and found to be  intact.  Chamfer box cut was then made.  Tibia was then keel punch and  trial reduction was performed.  The knee had about 10 degrees of flexion  despite coming out in full extension with the spacer blocks.  The  posterior capsule release was performed.  Tibia was downsized  to 4 with  continued good coverage achieved.  Keel punch was performed.  An  additional 3-mm punch was resected from the distal femur to achieve full  extension while maintaining flexion, which was stable.  This did have  the desired effect of achieving full extension and full flexion with  good stability and varus-valgus stress of 0, 30, and 90 degrees.  At  this time, trial components were removed.  The joint surfaces were  irrigated.  True components were placed with the same stability and  range of motion parameters maintained.  The patellar sleeve of tissue  remained central within the groove.  No overhang was noted on the  femoral component.  At this time, the tourniquet was released.  Bleeding  points were encountered and closed with electrocautery.  The incision  was thoroughly irrigated.  Hemovac drain was placed.  The incision was  then closed over a bolster using #1 Vicryl interrupted suture, 0 Vicryl  suture, 3-0 nylon,  and the staples.  The stab incisions for the pins  were irrigated and closed using sterile nylon suture.  Solution of  Marcaine, morphine, and clonidine was injected into the knee.  The  patient was placed in a bulky dressing.  The knee immobilizer was  perfused at the conclusion of the case.  The patient tolerated the  procedure well without immediate complications.  Dr. Lenny Pastel  assistance was required all times during the case for retraction of  important neurovascular structures and the limb positioning.  His  assistance was a medical necessity.      Burnard Bunting, M.D.  Electronically Signed     GSD/MEDQ  D:  11/08/2007  T:  11/09/2007  Job:  [id number]

## 2010-09-16 NOTE — Discharge Summary (Signed)
NAMEDAO, MEMMOTT                 ACCOUNT NO.:  [id number]   MEDICAL RECORD NO.:  [id number]          PATIENT TYPE:  INP   LOCATION:  5018                         FACILITY:  [facility]   PHYSICIAN:  Burnard Bunting, M.D.    DATE OF BIRTH:  [birth year]   DATE OF ADMISSION:  11/08/2007  DATE OF DISCHARGE:  11/11/2007                               DISCHARGE SUMMARY   ADMISSION DIAGNOSES:  1. Osteoarthritis, right knee.  2. Diabetes mellitus.  3. Hypertension  4. History of colorectal cancer, 2000.  5. Arthritis.  6. Dyslipidemia.   DISCHARGE DIAGNOSES:  1. Osteoarthritis, right knee.  2. Diabetes mellitus.  3. Hypertension  4. History of colorectal cancer, 2000.  5. Arthritis.  6. Dyslipidemia.  7. Posthemorrhagic anemia.   PROCEDURE:  On November 08, 2007, the patient underwent a right total knee  replacement performed by Dr. August Saucer, assisted by Dr. Tresa Res under  general anesthesia.   CONSULTATIONS:  None.   BRIEF HISTORY:  Mr. Ishman is a 70 year old male with right knee  arthritis who has failed conservative management.  He would like to  proceed with surgical intervention, and it was felt he would benefit  from a right total knee arthroplasty.  The patient was admitted for this  procedure.   BRIEF HOSPITAL COURSE:  The patient tolerated the procedure under  general anesthesia without complications.  Postoperatively,  neurovascular and motor function of the lower extremity was noted to be  intact.  Dressing changes were done daily, and wound was found to be  healing without signs of infection.  Coumadin was started for DVT  prophylaxis and adjustments made according to daily protimes by the  pharmacist at [facility].  INR at discharge was near therapeutic  value at 1.7.  The patient was started on CPM machine for range of  motion passively.  Physical therapy assisted him daily with ambulation  and gait training being weightbearing as tolerated as well as range of  motion  stretching and strengthening exercises.  The patient's Foley  catheter was discontinued, and he was voiding without difficulty.  IV  analgesics were weaned to p.o. analgesics without difficulty.  The  patient was afebrile, and vital signs were stable.  He was felt stable  for discharge to his home on November 11, 2007, after arrangements were made  through [facility] for home health physical therapy.   PERTINENT LABORATORY VALUES:  CBC on admission within normal limits.  Hemoglobin and hematocrit 13.9 and 39.3 on admission.  At discharge,  hemoglobin 9.7 and hematocrit 27.5.  INR on admission 0.9 and at  discharge 1.7.  Chemistry studies within normal limits with exception of  elevated glucose as high as 187.  He had one episode of hyponatremia at  134.  Calcium at discharge 8.1.  Urinalysis on admission negative for  urinary tract infection, and culture taken on admission showed no  growth.  X-ray of the right knee postoperatively showed expected  postoperative appearance after right total knee arthroplasty.   PLAN:  The patient is discharged to his home.  Arrangements were  made  for durable medical equipment, physical therapy, as well as occupational  therapy through [facility].  He will follow up with Dr. August Saucer in  2 weeks from surgery.  The patient will continue to use ice to the knee.  He will continue with physical therapy for ambulation and gait training  being weightbearing as tolerated.  He will continue with range of motion  stretching and strengthening exercises.  CPM machine available for him  at home to use 2 hours about every 8 hours increasing daily.  He will  keep his wound dry and clean at all times.  He will follow up with Dr.  August Saucer in 2 weeks.  He will continue home medications as taken prior to  admission which includes multivitamin daily.  He will use narcotic  analgesics as given to him by Dr. August Saucer for pain at home.   CONDITION ON DISCHARGE:   Stable.      Wende Neighbors, P.A.      Burnard Bunting, M.D.  Electronically Signed    SMV/MEDQ  D:  12/15/2007  T:  12/16/2007  Job:  [id number]

## 2011-01-29 LAB — CBC
HCT: 27.5 — ABNORMAL LOW
HCT: 29.9 — ABNORMAL LOW
HCT: 30.4 — ABNORMAL LOW
HCT: 39.3
Hemoglobin: 10.6 — ABNORMAL LOW
Hemoglobin: 10.8 — ABNORMAL LOW
Hemoglobin: 13.9
Hemoglobin: 9.7 — ABNORMAL LOW
MCHC: 35.1
MCHC: 35.4
MCHC: 35.5
MCHC: 35.6
MCV: 90
MCV: 90.3
MCV: 90.4
MCV: 90.8
Platelets: 133 — ABNORMAL LOW
Platelets: 148 — ABNORMAL LOW
Platelets: 152
Platelets: 171
RBC: 3.05 — ABNORMAL LOW
RBC: 3.31 — ABNORMAL LOW
RBC: 3.35 — ABNORMAL LOW
RBC: 4.37
RDW: 12.6
RDW: 12.6
RDW: 12.6
RDW: 13.1
WBC: 6.2
WBC: 7.8
WBC: 8
WBC: 8.4

## 2011-01-29 LAB — URINE CULTURE
Colony Count: NO GROWTH
Culture: NO GROWTH

## 2011-01-29 LAB — ABO/RH: ABO/RH(D): O POS

## 2011-01-29 LAB — TYPE AND SCREEN
ABO/RH(D): O POS
Antibody Screen: NEGATIVE

## 2011-01-29 LAB — BASIC METABOLIC PANEL
BUN: 13
BUN: 14
BUN: 18
CO2: 27
CO2: 29
CO2: 30
Calcium: 10
Calcium: 8.1 — ABNORMAL LOW
Calcium: 9
Chloride: 103
Chloride: 105
Chloride: 96
Creatinine, Ser: 0.97
Creatinine, Ser: 0.99
Creatinine, Ser: 1.05
GFR calc Af Amer: >60
GFR calc Af Amer: >60
GFR calc Af Amer: >60
GFR calc non Af Amer: >60
GFR calc non Af Amer: >60
GFR calc non Af Amer: >60
Glucose, Bld: 132 — ABNORMAL HIGH
Glucose, Bld: 149 — ABNORMAL HIGH
Glucose, Bld: 187 — ABNORMAL HIGH
Potassium: 3.8
Potassium: 4.2
Potassium: 4.5
Sodium: 134 — ABNORMAL LOW
Sodium: 136
Sodium: 139

## 2011-01-29 LAB — PROTIME-INR
INR: 0.9
INR: 1.1
INR: 1.6 — ABNORMAL HIGH
INR: 1.7 — ABNORMAL HIGH
Prothrombin Time: 12.1
Prothrombin Time: 14.5
Prothrombin Time: 19.3 — ABNORMAL HIGH
Prothrombin Time: 20.6 — ABNORMAL HIGH

## 2011-01-29 LAB — APTT: aPTT: 38 — ABNORMAL HIGH

## 2011-01-29 LAB — URINALYSIS, ROUTINE W REFLEX MICROSCOPIC
Bilirubin Urine: NEGATIVE
Glucose, UA: NEGATIVE
Hgb urine dipstick: NEGATIVE
Ketones, ur: NEGATIVE
Nitrite: NEGATIVE
Protein, ur: NEGATIVE
Specific Gravity, Urine: 1.019
Urobilinogen, UA: 0.2
pH: 6

## 2011-02-18 ENCOUNTER — Encounter: Payer: Self-pay | Admitting: Internal Medicine

## 2011-02-24 ENCOUNTER — Encounter: Payer: Self-pay | Admitting: Internal Medicine

## 2011-02-24 ENCOUNTER — Ambulatory Visit (INDEPENDENT_AMBULATORY_CARE_PROVIDER_SITE_OTHER): Payer: Medicare Other | Admitting: Internal Medicine

## 2011-02-24 DIAGNOSIS — I498 Other specified cardiac arrhythmias: Secondary | ICD-10-CM

## 2011-02-24 DIAGNOSIS — I495 Sick sinus syndrome: Secondary | ICD-10-CM

## 2011-02-24 DIAGNOSIS — I10 Essential (primary) hypertension: Secondary | ICD-10-CM

## 2011-02-24 DIAGNOSIS — R55 Syncope and collapse: Secondary | ICD-10-CM

## 2011-02-24 DIAGNOSIS — Z950 Presence of cardiac pacemaker: Secondary | ICD-10-CM

## 2011-02-24 LAB — PACEMAKER DEVICE OBSERVATION
AL AMPLITUDE: 5 mv
AL IMPEDENCE PM: 437.5 Ohm
AL THRESHOLD: 0.5 V
ATRIAL PACING PM: 1
BAMS-0001: 150 {beats}/min
BAMS-0003: 70 {beats}/min
BATTERY VOLTAGE: 2.9629 V
DEVICE MODEL PM: 2312578
RV LEAD AMPLITUDE: 12 mv
RV LEAD IMPEDENCE PM: 462.5 Ohm
RV LEAD THRESHOLD: 0.875 V
VENTRICULAR PACING PM: 1

## 2011-02-24 NOTE — Assessment & Plan Note (Signed)
No recurrent syncope 

## 2011-02-24 NOTE — Patient Instructions (Signed)
Remote monitoring is used to monitor your Pacemaker of ICD from home. This monitoring reduces the number of office visits required to check your device to one time per year. It allows [place] to keep an eye on the functioning of your device to ensure it is working properly. You are scheduled for a device check from home on 05/28/11. You may send your transmission at any time that day. If you have a wireless device, the transmission will be sent automatically. After your physician reviews your transmission, you will receive a postcard with your next transmission date.  Your physician wants you to follow-up in: 1 year with Dr. Graciela Husbands. You will receive a reminder letter in the mail two months in advance. If you don't  receive a letter, please call our office to schedule the follow-up appointment.  Your physician recommends that you continue on your current medications as directed. Please refer to the Current Medication list given to you today.

## 2011-02-24 NOTE — Progress Notes (Signed)
  HPI  Joshua Ali is a 70 y.o. male  seen following pacemaker implantation2011 because of syncope with documented sinus arrest of greater than 30 seconds.  He had undergone echo at that time demonstrating 1. Left ventriclular function was normal ejection fraction was in the range of 60% to 65%.   The patient denies SOB, chest pain, edema or palpitations He has done very well since then.    Past Medical History  Diagnosis Date  . Syncope   . Sick sinus syndrome   . Diabetes mellitus     type II  . Cardiac pacemaker in situ   . Hyperlipidemia   . Arthritis   . Hypertension   . History of colon cancer   . Diastolic dysfunction     chronic    Past Surgical History  Procedure Date  . Insert / replace / remove pacemaker     Dual chamber St Jude Accent DR Model PMS 2110  . Right total knee replacement     Current Outpatient Prescriptions  Medication Sig Dispense Refill  . aspirin 81 MG tablet Take 81 mg by mouth daily.        . Calcium Carbonate-Vitamin D (CALTRATE 600+D) 600-400 MG-UNIT per tablet Take 1 tablet by mouth daily.        . cetirizine (ZYRTEC) 10 MG tablet Take 10 mg by mouth daily.        . Cholecalciferol (VITAMIN D3) 1000 UNITS CAPS Take 1 capsule by mouth.        [name] HYDROcodone-acetaminophen (LORTAB) 7.5-500 MG per tablet Take 1 tablet by mouth every 6 (six) hours as needed.        [name] losartan-hydrochlorothiazide (HYZAAR) 100-25 MG per tablet Take 1 tablet by mouth daily.        . meloxicam (MOBIC) 15 MG tablet Take 15 mg by mouth daily.        . metFORMIN (GLUCOPHAGE) 1000 MG tablet Take 1,000 mg by mouth 2 (two) times daily with a meal.        . metoprolol (TOPROL-XL) 50 MG 24 hr tablet Take 50 mg by mouth daily. Take 1/2 tablet by mouth daily.      . Multiple Vitamin (MULTIVITAMIN) tablet Take 1 tablet by mouth daily.        . Omega-3 Fatty Acids (FISH OIL) 1000 MG CAPS Take 1 capsule by mouth daily.        . simvastatin (ZOCOR) 40 MG tablet Take 40 mg by  mouth at bedtime.        . fluticasone (FLONASE) 50 MCG/ACT nasal spray Place 2 sprays into the nose daily.          Allergies  Allergen Reactions  . Oxycodone-Acetaminophen     Review of Systems negative except from HPI and PMH  Physical Exam Well developed and well nourished in no acute distress HENT normal E scleral and icterus clear Neck Supple JVP flat; carotids brisk and full Clear to ausculation Regular rate and rhythm, no murmurs gallops or rub Soft with active bowel sounds No clubbing cyanosis and edema Alert and oriented, grossly normal motor and sensory function Skin Warm and Dry  ECG sinus rhythm with occasional PVVC Interval 0.19/.08/.36  axis is 26  Assessment and  Plan

## 2011-02-24 NOTE — Assessment & Plan Note (Signed)
mosdeslty elevated  Recommended followup with PCP

## 2011-02-24 NOTE — Assessment & Plan Note (Signed)
Atrially sensed and ventricularly sensed 99%

## 2011-02-24 NOTE — Assessment & Plan Note (Signed)
The patient's device was interrogated.  The information was reviewed. No changes were made in the programming.    

## 2011-04-02 NOTE — Progress Notes (Signed)
Addended by: Brien Mates R on: 04/02/2011 02:25 PM   Modules accepted: Orders

## 2011-05-28 ENCOUNTER — Encounter: Payer: Medicare Other | Admitting: *Deleted

## 2011-06-01 ENCOUNTER — Encounter: Payer: Self-pay | Admitting: *Deleted

## 2011-09-28 ENCOUNTER — Encounter: Payer: Self-pay | Admitting: *Deleted

## 2011-10-05 ENCOUNTER — Telehealth: Payer: Self-pay | Admitting: Internal Medicine

## 2011-10-05 NOTE — Telephone Encounter (Signed)
Please return call to patient wife Joshua Ali at [phone] regarding 2nd attempt at remote transmission.

## 2011-10-05 NOTE — Telephone Encounter (Signed)
Patient will call [email or web address] for technical assistance and attempt to resend transmission when his wife gets home.

## 2011-10-08 ENCOUNTER — Ambulatory Visit (INDEPENDENT_AMBULATORY_CARE_PROVIDER_SITE_OTHER): Payer: Medicare Other | Admitting: *Deleted

## 2011-10-08 ENCOUNTER — Encounter: Payer: Self-pay | Admitting: Internal Medicine

## 2011-10-08 DIAGNOSIS — I495 Sick sinus syndrome: Secondary | ICD-10-CM

## 2011-10-08 DIAGNOSIS — Z950 Presence of cardiac pacemaker: Secondary | ICD-10-CM

## 2011-10-09 LAB — REMOTE PACEMAKER DEVICE
AL AMPLITUDE: 5 mv
AL IMPEDENCE PM: 430 Ohm
AL THRESHOLD: 0.375 V
ATRIAL PACING PM: 1
BAMS-0001: 150 {beats}/min
BAMS-0003: 70 {beats}/min
BATTERY VOLTAGE: 2.98 V
DEVICE MODEL PM: 2312578
LV LEAD IMPEDENCE PM: 1 Ohm
RV LEAD AMPLITUDE: 12 mv
RV LEAD IMPEDENCE PM: 460 Ohm
RV LEAD THRESHOLD: 0.875 V
VENTRICULAR PACING PM: 1

## 2011-10-27 ENCOUNTER — Encounter: Payer: Self-pay | Admitting: *Deleted

## 2012-01-14 ENCOUNTER — Encounter: Payer: Medicare Other | Admitting: *Deleted

## 2012-01-19 ENCOUNTER — Encounter: Payer: Self-pay | Admitting: *Deleted

## 2012-02-17 ENCOUNTER — Ambulatory Visit (INDEPENDENT_AMBULATORY_CARE_PROVIDER_SITE_OTHER): Payer: Medicare Other | Admitting: Internal Medicine

## 2012-02-17 ENCOUNTER — Encounter: Payer: Self-pay | Admitting: Internal Medicine

## 2012-02-17 VITALS — BP 136/79 | HR 76 | Ht 67.0 in | Wt 198.0 lb

## 2012-02-17 DIAGNOSIS — R55 Syncope and collapse: Secondary | ICD-10-CM

## 2012-02-17 DIAGNOSIS — I10 Essential (primary) hypertension: Secondary | ICD-10-CM

## 2012-02-17 DIAGNOSIS — Z950 Presence of cardiac pacemaker: Secondary | ICD-10-CM

## 2012-02-17 DIAGNOSIS — I495 Sick sinus syndrome: Secondary | ICD-10-CM

## 2012-02-17 LAB — PACEMAKER DEVICE OBSERVATION
AL AMPLITUDE: 5 mv
AL IMPEDENCE PM: 460 Ohm
AL THRESHOLD: 0.5 V
ATRIAL PACING PM: 1
BAMS-0001: 150 {beats}/min
BAMS-0003: 70 {beats}/min
BATTERY VOLTAGE: 2.96 V
DEVICE MODEL PM: 2312578
RV LEAD AMPLITUDE: 12 mv
RV LEAD IMPEDENCE PM: 540 Ohm
RV LEAD THRESHOLD: 0.875 V
VENTRICULAR PACING PM: 1

## 2012-02-17 MED ORDER — AMLODIPINE BESYLATE 5 MG PO TABS: 5.0000 mg | ORAL_TABLET | Freq: Every day | ORAL | Status: DC

## 2012-02-17 NOTE — Assessment & Plan Note (Signed)
The patient is on a 4 drug regime for his blood pressure was too low doses of medications. He is to see Dr. Tresa Endo next week. I will take the liberty of stopping his metoprolol and doubling his amlodipine and defer final decisions to Dr. Tresa Endo.

## 2012-02-17 NOTE — Assessment & Plan Note (Signed)
No recurrent syncope 

## 2012-02-17 NOTE — Patient Instructions (Addendum)
Remote monitoring is used to monitor your Pacemaker of ICD from home. This monitoring reduces the number of office visits required to check your device to one time per year. It allows [place] to keep an eye on the functioning of your device to ensure it is working properly. You are scheduled for a device check from home on May 23, 2012. You may send your transmission at any time that day. If you have a wireless device, the transmission will be sent automatically. After your physician reviews your transmission, you will receive a postcard with your next transmission date.  Your physician wants you to follow-up in: 1 year with Dr Graciela Husbands.  You will receive a reminder letter in the mail two months in advance. If you don't receive a letter, please call our office to schedule the follow-up appointment.  STOP metoprolol.  NEW AMLODIPINE DOSE:  5mg  daily.

## 2012-02-17 NOTE — Assessment & Plan Note (Signed)
The patient's device was interrogated.  The information was reviewed. No changes were made in the programming.    

## 2012-02-17 NOTE — Progress Notes (Signed)
Patient Care Team: Olivia Canter as PCP - General (Unknown Physician Specialty)   HPI  Joshua Ali is a 71 y.o. male seen following pacemaker implantation2011 because of syncope with documented sinus arrest of greater than 30 seconds.  He had undergone echo at that time demonstrating 1. Left ventriclular function was normal ejection fraction was in the range of 60% to 65%.   .  He has had no intercurrent syncope.  He denies chest pain shortness of breath or peripheral edema.   Past Medical History  Diagnosis Date  . Syncope   . Sick sinus syndrome   . Diabetes mellitus     type II  . Cardiac pacemaker in situ   . Hyperlipidemia   . Arthritis   . Hypertension   . History of colon cancer   . Diastolic dysfunction     chronic    Past Surgical History  Procedure Date  . Insert / replace / remove pacemaker     Dual chamber St Jude Accent DR Model PMS 2110  . Right total knee replacement     Current Outpatient Prescriptions  Medication Sig Dispense Refill  . aspirin 81 MG tablet Take 81 mg by mouth daily.        . Calcium Carbonate-Vitamin D (CALTRATE 600+D) 600-400 MG-UNIT per tablet Take 1 tablet by mouth daily.        . cetirizine (ZYRTEC) 10 MG tablet Take 10 mg by mouth daily.        . Cholecalciferol (VITAMIN D3) 1000 UNITS CAPS Take 1 capsule by mouth.        . fluticasone (FLONASE) 50 MCG/ACT nasal spray Place 2 sprays into the nose daily.        [name] HYDROcodone-acetaminophen (LORTAB) 7.5-500 MG per tablet Take 1 tablet by mouth every 6 (six) hours as needed.        [name] losartan-hydrochlorothiazide (HYZAAR) 100-25 MG per tablet Take 1 tablet by mouth daily.        . meloxicam (MOBIC) 15 MG tablet Take 15 mg by mouth daily.        . metFORMIN (GLUCOPHAGE) 1000 MG tablet Take 1,000 mg by mouth 2 (two) times daily with a meal.        . metoprolol (TOPROL-XL) 50 MG 24 hr tablet Take 50 mg by mouth daily. Take 1/2 tablet by mouth daily.      . Multiple Vitamin  (MULTIVITAMIN) tablet Take 1 tablet by mouth daily.        . Omega-3 Fatty Acids (FISH OIL) 1000 MG CAPS Take 1 capsule by mouth daily.        . simvastatin (ZOCOR) 40 MG tablet Take 40 mg by mouth at bedtime.          Allergies  Allergen Reactions  . Oxycodone-Acetaminophen     Review of Systems negative except from HPI and PMH  Physical Exam BP 136/79  Pulse 76  Ht 5\' 7"  (1.702 m)  Wt 198 lb (89.812 kg)  BMI 31.01 kg/m2 Well developed and well nourished in no acute distress HENT normal E scleral and icterus clear Neck Supple JVP flat; carotids brisk and full Clear to ausculation Device pocket well healed; without hematoma or erythema   Regular rate and rhythm, no murmurs gallops or rub Soft with active bowel sounds No clubbing cyanosis none Edema Alert and oriented, grossly normal motor and sensory function Skin Warm and Dry  Electrocardiogram  today demonstrates sinus rhythm at 76 intervals 19/08/37 axis  is 53 otherwise normal  Assessment and  Plan

## 2012-05-23 ENCOUNTER — Encounter: Payer: Medicare Other | Admitting: *Deleted

## 2012-05-24 ENCOUNTER — Encounter: Payer: Self-pay | Admitting: *Deleted

## 2012-08-15 ENCOUNTER — Encounter: Payer: Self-pay | Admitting: Internal Medicine

## 2013-01-19 ENCOUNTER — Encounter: Payer: Self-pay | Admitting: Internal Medicine

## 2013-02-05 ENCOUNTER — Other Ambulatory Visit: Payer: Self-pay | Admitting: Internal Medicine

## 2013-02-21 ENCOUNTER — Ambulatory Visit (INDEPENDENT_AMBULATORY_CARE_PROVIDER_SITE_OTHER): Payer: Medicare Other | Admitting: Internal Medicine

## 2013-02-21 VITALS — BP 142/89 | HR 102 | Ht 67.0 in | Wt 194.8 lb

## 2013-02-21 DIAGNOSIS — Z950 Presence of cardiac pacemaker: Secondary | ICD-10-CM

## 2013-02-21 DIAGNOSIS — R55 Syncope and collapse: Secondary | ICD-10-CM

## 2013-02-21 DIAGNOSIS — I495 Sick sinus syndrome: Secondary | ICD-10-CM

## 2013-02-21 LAB — PACEMAKER DEVICE OBSERVATION
AL AMPLITUDE: 5 mv
AL IMPEDENCE PM: 450 Ohm
AL THRESHOLD: 0.5 V
ATRIAL PACING PM: 1
BAMS-0001: 150 {beats}/min
BAMS-0003: 70 {beats}/min
BATTERY VOLTAGE: 2.9478 V
DEVICE MODEL PM: 2312578
RV LEAD AMPLITUDE: 12 mv
RV LEAD IMPEDENCE PM: 550 Ohm
RV LEAD THRESHOLD: 0.875 V
VENTRICULAR PACING PM: 1

## 2013-02-21 NOTE — Assessment & Plan Note (Signed)
No recurrent syncope 

## 2013-02-21 NOTE — Progress Notes (Signed)
      Patient Care Team: Olivia Canter as PCP - General (Unknown Physician Specialty)   HPI  Joshua Ali is a 72 y.o. male Seen in followup for pacemaker implantation 2011 because of syncope associated with a documented pause of greater than 30 seconds. Echocardiogram at that time demonstrated normal left ventricular function  No recurrent syncope  HR always fast  Past Medical History  Diagnosis Date  . Syncope   . Sick sinus syndrome   . Diabetes mellitus     type II  . Cardiac pacemaker in situ   . Hyperlipidemia   . Arthritis   . Hypertension   . History of colon cancer   . Diastolic dysfunction     chronic    Past Surgical History  Procedure Laterality Date  . Insert / replace / remove pacemaker      Dual chamber St Jude Accent DR Model PMS 2110  . Right total knee replacement      Current Outpatient Prescriptions  Medication Sig Dispense Refill  . acetaminophen (TYLENOL) 650 MG CR tablet Take 650 mg by mouth every 8 (eight) hours as needed.      [name] amLODipine (NORVASC) 5 MG tablet TAKE 1 TABLET (5 MG TOTAL) BY MOUTH DAILY.  90 tablet  0  . aspirin 81 MG tablet Take 81 mg by mouth daily.        [name] atorvastatin (LIPITOR) 40 MG tablet Take 40 mg by mouth daily.      . Calcium Carbonate-Vitamin D (CALTRATE 600+D) 600-400 MG-UNIT per tablet Take 1 tablet by mouth daily.        . cetirizine (ZYRTEC) 10 MG tablet Take 10 mg by mouth daily.        . Cholecalciferol (VITAMIN D3) 1000 UNITS CAPS Take 1 capsule by mouth.        [name] glimepiride (AMARYL) 2 MG tablet Take 1 mg by mouth daily.      [name] losartan-hydrochlorothiazide (HYZAAR) 100-25 MG per tablet Take 1 tablet by mouth daily.        . meloxicam (MOBIC) 15 MG tablet Take 15 mg by mouth daily.        . metFORMIN (GLUCOPHAGE) 1000 MG tablet Take 1,000 mg by mouth 2 (two) times daily with a meal.        . Multiple Vitamin (MULTIVITAMIN) tablet Take 1 tablet by mouth daily.        . Omega-3 Fatty Acids (FISH OIL) 1000  MG CAPS Take 1 capsule by mouth daily.         No current facility-administered medications for this visit.    Allergies  Allergen Reactions  . Oxycodone-Acetaminophen     Review of Systems negative except from HPI and PMH  Physical Exam BP 142/89  Pulse 102  Ht 5\' 7"  (1.702 m)  Wt 194 lb 12.8 oz (88.361 kg)  BMI 30.5 kg/m2 Well developed and well nourished in no acute distress HENT normal E scleral and icterus clear Neck Supple JVP flat; carotids brisk and full Clear to ausculation Device pocket well healed; without hematoma or erythema.  There is no tethering Regular rate and rhythm, no murmurs gallops or rub Soft with active bowel sounds No clubbing cyanosis none Edema Alert and oriented, grossly normal motor and sensory function Skin Warm and Dry  ECG demonstrates sinus rhythm at 103 Interval 17/09/37  Assessment and  Plan

## 2013-02-21 NOTE — Assessment & Plan Note (Signed)
The patient's device was interrogated.  The information was reviewed. No changes were made in the programming.    it is noteworthy that his heart rate distribution is rather flat 20% of his heartbeat in each of the bins from 70-110

## 2013-02-21 NOTE — Assessment & Plan Note (Signed)
Interestingly he is somewhat tachycardic blood work is pending echo next week with his PCP

## 2013-02-21 NOTE — Patient Instructions (Signed)
Remote monitoring is used to monitor your Pacemaker of ICD from home. This monitoring reduces the number of office visits required to check your device to one time per year. It allows [place] to keep an eye on the functioning of your device to ensure it is working properly. You are scheduled for a device check from home on 05/23/2013. You may send your transmission at any time that day. If you have a wireless device, the transmission will be sent automatically. After your physician reviews your transmission, you will receive a postcard with your next transmission date.  Your physician wants you to follow-up in: one year with Rick Duff, PAC.  You will receive a reminder letter in the mail two months in advance. If you don't receive a letter, please call our office to schedule the follow-up appointment.

## 2013-02-28 ENCOUNTER — Encounter: Payer: Self-pay | Admitting: Internal Medicine

## 2013-05-07 ENCOUNTER — Other Ambulatory Visit: Payer: Self-pay | Admitting: Internal Medicine

## 2013-05-22 ENCOUNTER — Ambulatory Visit (INDEPENDENT_AMBULATORY_CARE_PROVIDER_SITE_OTHER): Payer: Medicare Other | Admitting: *Deleted

## 2013-05-22 ENCOUNTER — Encounter: Payer: Self-pay | Admitting: Internal Medicine

## 2013-05-22 DIAGNOSIS — I495 Sick sinus syndrome: Secondary | ICD-10-CM

## 2013-05-23 ENCOUNTER — Ambulatory Visit (INDEPENDENT_AMBULATORY_CARE_PROVIDER_SITE_OTHER): Payer: Medicare Other | Admitting: *Deleted

## 2013-05-23 DIAGNOSIS — I495 Sick sinus syndrome: Secondary | ICD-10-CM

## 2013-05-30 LAB — MDC_IDC_ENUM_SESS_TYPE_REMOTE
Battery Remaining Longevity: 103 mo
Battery Voltage: 2.95 V
Brady Statistic AP VP Percent: 1 %
Brady Statistic AP VS Percent: 1 %
Brady Statistic AS VP Percent: 1 %
Brady Statistic AS VS Percent: 99 %
Brady Statistic RA Percent Paced: 1 %
Brady Statistic RV Percent Paced: 1 %
Date Time Interrogation Session: 20150117211022
Implantable Pulse Generator Model: 2110
Implantable Pulse Generator Serial Number: 2312578
Lead Channel Impedance Value: 440 Ohm
Lead Channel Impedance Value: 540 Ohm
Lead Channel Pacing Threshold Amplitude: 0.5 V
Lead Channel Pacing Threshold Amplitude: 0.875 V
Lead Channel Pacing Threshold Pulse Width: 0.5 ms
Lead Channel Pacing Threshold Pulse Width: 0.5 ms
Lead Channel Sensing Intrinsic Amplitude: 12 mV
Lead Channel Sensing Intrinsic Amplitude: 5 mV
Lead Channel Setting Pacing Amplitude: 1.125
Lead Channel Setting Pacing Amplitude: 1.5 V
Lead Channel Setting Pacing Pulse Width: 0.5 ms
Lead Channel Setting Sensing Sensitivity: 2 mV

## 2013-06-13 ENCOUNTER — Encounter: Payer: Self-pay | Admitting: *Deleted

## 2013-07-03 ENCOUNTER — Emergency Department (HOSPITAL_COMMUNITY)
Admission: EM | Admit: 2013-07-03 | Discharge: 2013-07-04 | Disposition: A | Discharge status: Home / Self Care | Payer: Medicare Other | Attending: Emergency Medicine | Admitting: Emergency Medicine

## 2013-07-03 ENCOUNTER — Encounter (HOSPITAL_COMMUNITY): Payer: Self-pay | Admitting: Emergency Medicine

## 2013-07-03 DIAGNOSIS — M129 Arthropathy, unspecified: Secondary | ICD-10-CM | POA: Insufficient documentation

## 2013-07-03 DIAGNOSIS — IMO0002 Reserved for concepts with insufficient information to code with codable children: Secondary | ICD-10-CM | POA: Insufficient documentation

## 2013-07-03 DIAGNOSIS — R42 Dizziness and giddiness: Secondary | ICD-10-CM | POA: Insufficient documentation

## 2013-07-03 DIAGNOSIS — E785 Hyperlipidemia, unspecified: Secondary | ICD-10-CM | POA: Insufficient documentation

## 2013-07-03 DIAGNOSIS — Z85038 Personal history of other malignant neoplasm of large intestine: Secondary | ICD-10-CM | POA: Insufficient documentation

## 2013-07-03 DIAGNOSIS — Z79899 Other long term (current) drug therapy: Secondary | ICD-10-CM | POA: Insufficient documentation

## 2013-07-03 DIAGNOSIS — Z7982 Long term (current) use of aspirin: Secondary | ICD-10-CM | POA: Insufficient documentation

## 2013-07-03 DIAGNOSIS — Z950 Presence of cardiac pacemaker: Secondary | ICD-10-CM | POA: Insufficient documentation

## 2013-07-03 DIAGNOSIS — I10 Essential (primary) hypertension: Secondary | ICD-10-CM | POA: Insufficient documentation

## 2013-07-03 DIAGNOSIS — R55 Syncope and collapse: Secondary | ICD-10-CM | POA: Insufficient documentation

## 2013-07-03 DIAGNOSIS — E119 Type 2 diabetes mellitus without complications: Secondary | ICD-10-CM | POA: Insufficient documentation

## 2013-07-03 DIAGNOSIS — Z87891 Personal history of nicotine dependence: Secondary | ICD-10-CM | POA: Insufficient documentation

## 2013-07-03 DIAGNOSIS — Z791 Long term (current) use of non-steroidal anti-inflammatories (NSAID): Secondary | ICD-10-CM | POA: Insufficient documentation

## 2013-07-03 DIAGNOSIS — Z792 Long term (current) use of antibiotics: Secondary | ICD-10-CM | POA: Insufficient documentation

## 2013-07-03 LAB — BASIC METABOLIC PANEL
BUN: 23 mg/dL (ref 6–23)
CO2: 24 mEq/L (ref 19–32)
Calcium: 9.6 mg/dL (ref 8.4–10.5)
Chloride: 99 mEq/L (ref 96–112)
Creatinine, Ser: 1.02 mg/dL (ref 0.50–1.35)
GFR calc Af Amer: 84 mL/min — ABNORMAL LOW (ref >90)
GFR calc non Af Amer: 72 mL/min — ABNORMAL LOW (ref >90)
Glucose, Bld: 149 mg/dL — ABNORMAL HIGH (ref 70–99)
Potassium: 4.1 mEq/L (ref 3.7–5.3)
Sodium: 138 mEq/L (ref 137–147)

## 2013-07-03 LAB — I-STAT TROPONIN, ED: Troponin i, poc: 0 ng/mL (ref 0.00–0.08)

## 2013-07-03 LAB — CBC
HCT: 39.4 % (ref 39.0–52.0)
Hemoglobin: 14.2 g/dL (ref 13.0–17.0)
MCH: 30.4 pg (ref 26.0–34.0)
MCHC: 36 g/dL (ref 30.0–36.0)
MCV: 84.4 fL (ref 78.0–100.0)
Platelets: 170 10*3/uL (ref 150–400)
RBC: 4.67 MIL/uL (ref 4.22–5.81)
RDW: 12.3 % (ref 11.5–15.5)
WBC: 7.8 10*3/uL (ref 4.0–10.5)

## 2013-07-03 NOTE — ED Notes (Addendum)
Spoke to [place] from [place] [phone]). Pt did not have any events today. Last event was Feb. 28 where he had fast ventriclar rate. Duel chamber pacemaker working fine.

## 2013-07-03 NOTE — ED Notes (Addendum)
Attempted interrogation x2 unsuccessfully, speaking with [employer] at this time.

## 2013-07-03 NOTE — ED Notes (Signed)
Pt brought to ED by EMS with near episode of syncope.CBG 142.Pt has a a Psychologist, forensic. Pt denies any chest pain or SOB. GCS 15.

## 2013-07-03 NOTE — ED Notes (Signed)
Successful transmission and receipt of interrogation complete. Call and fax received. Primary RN made aware. EDP informed.

## 2013-07-04 ENCOUNTER — Emergency Department (HOSPITAL_COMMUNITY): Payer: Medicare Other

## 2013-07-04 LAB — I-STAT TROPONIN, ED: Troponin i, poc: 0 ng/mL (ref 0.00–0.08)

## 2013-07-04 NOTE — Discharge Instructions (Signed)
Near-Syncope °Near-syncope (commonly known as near fainting) is sudden weakness, dizziness, or feeling like you might pass out. During an episode of near-syncope, you may also develop pale skin, have tunnel vision, or feel sick to your stomach (nauseous). Near-syncope may occur when getting up after sitting or while standing for a long time. It is caused by a sudden decrease in blood flow to the brain. This decrease can result from various causes or triggers, most of which are not serious. However, because near-syncope can sometimes be a sign of something serious, a medical evaluation is required. The specific cause is often not determined. °HOME CARE INSTRUCTIONS  °Monitor your condition for any changes. The following actions may help to alleviate any discomfort you are experiencing: °· Have someone stay with you until you feel stable. °· Lie down right away if you start feeling like you might faint. Breathe deeply and steadily. Wait until all the symptoms have passed. Most of these episodes last only a few minutes. You may feel tired for several hours.   °· Drink enough fluids to keep your urine clear or pale yellow.   °· If you are taking blood pressure or heart medicine, get up slowly when seated or lying down. Take several minutes to sit and then stand. This can reduce dizziness. °· Follow up with your health care provider as directed.  °SEEK IMMEDIATE MEDICAL CARE IF:  °· You have a severe headache.   °· You have unusual pain in the chest, abdomen, or back.   °· You are bleeding from the mouth or rectum, or you have black or tarry stool.   °· You have an irregular or very fast heartbeat.   °· You have repeated fainting or have seizure-like jerking during an episode.   °· You faint when sitting or lying down.   °· You have confusion.   °· You have difficulty walking.   °· You have severe weakness.   °· You have vision problems.   °MAKE SURE YOU:  °· Understand these instructions. °· Will watch your  condition. °· Will get help right away if you are not doing well or get worse. °Document Released: 04/20/2005 Document Revised: 12/21/2012 Document Reviewed: 09/23/2012 °Exit[employer]® Patient Information ©2014 [employer]. ° °

## 2013-07-04 NOTE — ED Provider Notes (Signed)
CSN: [id number]     Arrival date & time 07/03/13  1914 History   First MD Initiated Contact with Patient 07/04/13 0017     Chief Complaint  Patient presents with  . Near Syncope     (Consider location/radiation/quality/duration/timing/severity/associated sxs/prior Treatment) HPI Comments: Pt presents with episode of near syncope around 3:30 PM today while sitting in a chair described as sudden onset lightheadedness, clamminess.  Denied CP, palpitations, SOB, n/v. Episode lasted 20-30 mins. He checked his glucose which was normal.   Patient is a 73 y.o. male presenting with near-syncope. The history is provided by the patient. No language interpreter was used.  Near Syncope This is a new problem. The current episode started 6 to 12 hours ago. The problem occurs rarely. The problem has been resolved. Pertinent negatives include no chest pain, no abdominal pain, no headaches and no shortness of breath. Nothing aggravates the symptoms. The symptoms are relieved by rest. He has tried rest for the symptoms. The treatment provided significant relief.    Past Medical History  Diagnosis Date  . Syncope   . Sick sinus syndrome   . Diabetes mellitus     type II  . Cardiac pacemaker in situ   . Hyperlipidemia   . Arthritis   . Hypertension   . History of colon cancer   . Diastolic dysfunction     chronic   Past Surgical History  Procedure Laterality Date  . Insert / replace / remove pacemaker      Dual chamber St Jude Accent DR Model PMS 2110  . Right total knee replacement     No family history on file. History  Substance Use Topics  . Smoking status: Former Research scientist (life sciences)  . Smokeless tobacco: Not on file  . Alcohol Use: No    Review of Systems  Constitutional: Negative for fever, activity change, appetite change and fatigue.  HENT: Negative for congestion, facial swelling, rhinorrhea and trouble swallowing.   Eyes: Negative for photophobia and pain.  Respiratory: Negative for cough,  chest tightness and shortness of breath.   Cardiovascular: Positive for near-syncope. Negative for chest pain and leg swelling.  Gastrointestinal: Negative for nausea, vomiting, abdominal pain, diarrhea and constipation.  Endocrine: Negative for polydipsia and polyuria.  Genitourinary: Negative for dysuria, urgency, decreased urine volume and difficulty urinating.  Musculoskeletal: Negative for back pain and gait problem.  Skin: Negative for color change, rash and wound.  Allergic/Immunologic: Negative for immunocompromised state.  Neurological: Negative for dizziness, facial asymmetry, speech difficulty, weakness, numbness and headaches.  Psychiatric/Behavioral: Negative for confusion, decreased concentration and agitation.      Allergies  Oxycodone-acetaminophen  Home Medications   Current Outpatient Rx  Name  Route  Sig  Dispense  Refill  . acetaminophen (TYLENOL) 650 MG CR tablet   Oral   Take 650 mg by mouth every 8 (eight) hours as needed for pain.          [name] amLODipine (NORVASC) 5 MG tablet   Oral   Take 5 mg by mouth daily.         [name] aspirin 81 MG tablet   Oral   Take 81 mg by mouth daily.         [name] atorvastatin (LIPITOR) 40 MG tablet   Oral   Take 40 mg by mouth at bedtime.          . Calcium Carbonate-Vitamin D (CALTRATE 600+D) 600-400 MG-UNIT per tablet   Oral   Take 1 tablet by  mouth daily.           . Cholecalciferol (VITAMIN D3) 1000 UNITS CAPS   Oral   Take 2,000 Units by mouth.          . fluticasone (FLONASE) 50 MCG/ACT nasal spray   Each Nare   Place 2 sprays into both nostrils daily.         [name] glimepiride (AMARYL) 2 MG tablet   Oral   Take 2 mg by mouth daily.          [name] loratadine (CLARITIN) 10 MG tablet   Oral   Take 10 mg by mouth daily.         [name] losartan-hydrochlorothiazide (HYZAAR) 100-25 MG per tablet   Oral   Take 1 tablet by mouth daily.           . meloxicam (MOBIC) 15 MG tablet   Oral   Take 15 mg by  mouth daily.           . metFORMIN (GLUCOPHAGE) 1000 MG tablet   Oral   Take 1,000 mg by mouth 2 (two) times daily with a meal.           . Multiple Vitamin (MULTIVITAMIN WITH MINERALS) TABS tablet   Oral   Take 1 tablet by mouth daily.         [name] neomycin-bacitracin-polymyxin (NEOSPORIN) OINT   Topical   Apply 1 application topically daily.          BP 149/83  Pulse 96  Temp(Src) 98.6 F (37 C) (Oral)  Resp 20  SpO2 94% Physical Exam  Constitutional: He is oriented to person, place, and time. He appears well-developed and well-nourished. No distress.  HENT:  Head: Normocephalic and atraumatic.  Mouth/Throat: No oropharyngeal exudate.  Eyes: Pupils are equal, round, and reactive to light.  Neck: Normal range of motion. Neck supple.  Cardiovascular: Normal rate, regular rhythm and normal heart sounds.  Exam reveals no gallop and no friction rub.   No murmur heard. Pulmonary/Chest: Effort normal and breath sounds normal. No respiratory distress. He has no wheezes. He has no rales.  Abdominal: Soft. Bowel sounds are normal. He exhibits no distension and no mass. There is no tenderness. There is no rebound and no guarding.  Musculoskeletal: Normal range of motion. He exhibits no edema and no tenderness.  Neurological: He is alert and oriented to person, place, and time.  Skin: Skin is warm and dry.  Psychiatric: He has a normal mood and affect.    ED Course  Procedures (including critical care time) Labs Review Labs Reviewed  BASIC METABOLIC PANEL - Abnormal; Notable for the following:    Glucose, Bld 149 (*)    GFR calc non Af Amer 72 (*)    GFR calc Af Amer 84 (*)    All other components within normal limits  CBC  I-STAT TROPOININ, ED  Randolm Idol, ED   Imaging Review Dg Chest 2 View  07/04/2013   CLINICAL DATA:  Near syncope  EXAM: CHEST  2 VIEW  COMPARISON:  01/05/2009  FINDINGS: Unchanged orientation of dual-chamber left approach pacer wires.  No  cardiomegaly. Unchanged aortic tortuosity. No edema or consolidation. No effusion or pneumothorax.  IMPRESSION: No active cardiopulmonary disease. Unchanged appearance of dual-chamber left-sided pacer.   Electronically Signed   By: Jorje Guild M.D.   On: 07/04/2013 00:59     EKG Interpretation None      MDM   Final diagnoses:  Near  syncope    Pt is a 73 y.o. male with Pmhx as above who presents with episode of near syncope around 3:30 PM today while sitting in a chair described as sudden onset lightheadedness, clamminess.  Denied CP, palpitations, SOB, n/v. Pt seen by myself approx 8.5 hrs after episode, has been in ED for approx 5 hrs prior to my first exam and has not had reoccurrence.  Pt's pacemaker interrogated and he was found ot have no evernts today. He had episode of tachyarrhythmia feb 26, 27, 28th, dual chamber pacemaker firing appropriately.  Pt currently in sinus rhythm.  Delta trop done in dept negative. CBC, BMP, CXR unremarkable.  Given no events seen on pacemaker interrogation, and pt has not had further episode here after many hours in the ED, I feel he is safe for d/c and close f/u with his cardiologist. He will call the office when it opens this morning.  Return precautions given for new or worsening symptoms including further episodes, CP, SOB.          Neta Ehlers, MD 07/04/13 2000

## 2013-07-06 ENCOUNTER — Encounter: Payer: Self-pay | Admitting: Internal Medicine

## 2013-07-06 ENCOUNTER — Ambulatory Visit (INDEPENDENT_AMBULATORY_CARE_PROVIDER_SITE_OTHER): Payer: Medicare Other | Admitting: Internal Medicine

## 2013-07-06 VITALS — BP 157/94 | HR 117 | Ht 67.0 in | Wt 198.0 lb

## 2013-07-06 DIAGNOSIS — R55 Syncope and collapse: Secondary | ICD-10-CM

## 2013-07-06 DIAGNOSIS — Z950 Presence of cardiac pacemaker: Secondary | ICD-10-CM

## 2013-07-06 DIAGNOSIS — I495 Sick sinus syndrome: Secondary | ICD-10-CM

## 2013-07-06 NOTE — Patient Instructions (Signed)
Your physician recommends that you continue on your current medications as directed. Please refer to the Current Medication list given to you today.  Remote monitoring is used to monitor your Pacemaker of ICD from home. This monitoring reduces the number of office visits required to check your device to one time per year. It allows [place] to keep an eye on the functioning of your device to ensure it is working properly. You are scheduled for a device check from home on 10/05/13. You may send your transmission at any time that day. If you have a wireless device, the transmission will be sent automatically. After your physician reviews your transmission, you will receive a postcard with your next transmission date.  Your physician wants you to follow-up in: 6 months with Dr. Caryl Comes. You will receive a reminder letter in the mail two months in advance. If you don't receive a letter, please call our office to schedule the follow-up appointment.  Handwritten prescription given to draw TSH lab and fax results to [place].

## 2013-07-06 NOTE — Progress Notes (Signed)
Patient Care Team: Olin Hauser as PCP - General (Unknown Physician Specialty)   HPI  Joshua Ali is a 73 y.o. male Seen in followup for pacemaker implantation 2011 because of syncope associated with a documented pause of greater than 30 seconds. Echocardiogram at that time demonstrated normal left ventricular function  No recurrent syncope  HR always fast hemoglobin has been normal in the last couple years although his low remotely. I do not see a TSH in the chart.  He was seen in the emergency room on Monday with an episode of presyncope that was reminiscent of the syncope they used to occur prior to pacemaker implantation.   Past Medical History  Diagnosis Date  . Syncope   . Sick sinus syndrome   . Diabetes mellitus     type II  . Cardiac pacemaker in situ   . Hyperlipidemia   . Arthritis   . Hypertension   . History of colon cancer   . Diastolic dysfunction     chronic    Past Surgical History  Procedure Laterality Date  . Insert / replace / remove pacemaker      Dual chamber St Jude Accent DR Model PMS 2110  . Right total knee replacement      Current Outpatient Prescriptions  Medication Sig Dispense Refill  . acetaminophen (TYLENOL) 650 MG CR tablet Take 650 mg by mouth every 8 (eight) hours as needed for pain.       [name] amLODipine (NORVASC) 5 MG tablet Take 5 mg by mouth daily.      [name] aspirin 81 MG tablet Take 81 mg by mouth daily.      [name] atorvastatin (LIPITOR) 40 MG tablet Take 40 mg by mouth at bedtime.       . Calcium Carbonate-Vitamin D (CALTRATE 600+D) 600-400 MG-UNIT per tablet Take 1 tablet by mouth daily.        . fluticasone (FLONASE) 50 MCG/ACT nasal spray Place 2 sprays into both nostrils as needed.       [name] glimepiride (AMARYL) 2 MG tablet Take 2 mg by mouth daily.       [name] loratadine (CLARITIN) 10 MG tablet Take 10 mg by mouth daily.      [name] losartan-hydrochlorothiazide (HYZAAR) 100-25 MG per tablet Take 1 tablet by mouth daily.        .  metFORMIN (GLUCOPHAGE) 1000 MG tablet Take 1,000 mg by mouth 2 (two) times daily with a meal.        . Multiple Vitamin (MULTIVITAMIN WITH MINERALS) TABS tablet Take 1 tablet by mouth daily.      [name] neomycin-bacitracin-polymyxin (NEOSPORIN) ointment Apply 1 application topically as needed for wound care. apply to eye       No current facility-administered medications for this visit.    Allergies  Allergen Reactions  . Oxycodone-Acetaminophen Other (See Comments)    unknown    Review of Systems negative except from HPI and PMH  Physical Exam BP 157/94  Pulse 117  Ht 5\' 7"  (1.702 m)  Wt 198 lb (89.812 kg)  BMI 31.00 kg/m2 Well developed and well nourished in no acute distress HENT normal E scleral and icterus clear Neck Supple JVP flat; carotids brisk and full Clear to ausculation  Regular rate and rhythm, no murmurs gallops or rub Soft with active bowel sounds No clubbing cyanosis none Edema Alert and oriented, grossly normal motor and sensory function Skin Warm and Dry  ECG from the emergency  room is not available for review  Assessment and  Plan  Sinus tachycardia-will need TSH  Syncope-probably neurally mediated  Prolonged sinus pauses associated with #2  Recent presyncope likely related to #2 and aborted by pacing  Status post pacemaker-[employer]. Jude  He continues to manifest sinus tachycardia for reasons that are not clear. I presume his TSH has been normal and checked by his PCP although we do not have records of this year.  I expect he has inappropriate sinus tachycardia is a manifestation of dysautonomia. I was pleased syncope did not unfold from his typical prodrome perhaps related to backup bradycardia pacing.

## 2013-07-13 ENCOUNTER — Encounter: Payer: Self-pay | Admitting: Internal Medicine

## 2013-08-11 ENCOUNTER — Other Ambulatory Visit: Payer: Self-pay | Admitting: Internal Medicine

## 2013-10-05 ENCOUNTER — Encounter: Payer: Medicare Other | Admitting: *Deleted

## 2013-10-06 ENCOUNTER — Telehealth: Payer: Self-pay | Admitting: Cardiology

## 2013-10-06 NOTE — Telephone Encounter (Signed)
LMOVM reminding pt to send remote transmission.   

## 2013-10-09 ENCOUNTER — Encounter: Payer: Self-pay | Admitting: Cardiology

## 2013-11-22 ENCOUNTER — Encounter: Payer: Self-pay | Admitting: *Deleted

## 2013-12-06 ENCOUNTER — Encounter: Payer: Self-pay | Admitting: *Deleted

## 2014-01-31 ENCOUNTER — Encounter: Payer: Self-pay | Admitting: *Deleted

## 2014-02-13 ENCOUNTER — Encounter: Payer: Self-pay | Admitting: Cardiology

## 2014-02-26 ENCOUNTER — Encounter: Payer: Self-pay | Admitting: Internal Medicine

## 2014-02-26 ENCOUNTER — Ambulatory Visit (INDEPENDENT_AMBULATORY_CARE_PROVIDER_SITE_OTHER): Payer: Medicare Other | Admitting: Internal Medicine

## 2014-02-26 ENCOUNTER — Encounter: Payer: Self-pay | Admitting: *Deleted

## 2014-02-26 VITALS — BP 150/80 | HR 77 | Ht 67.0 in | Wt 193.6 lb

## 2014-02-26 DIAGNOSIS — R55 Syncope and collapse: Secondary | ICD-10-CM

## 2014-02-26 DIAGNOSIS — I495 Sick sinus syndrome: Secondary | ICD-10-CM

## 2014-02-26 DIAGNOSIS — I10 Essential (primary) hypertension: Secondary | ICD-10-CM

## 2014-02-26 DIAGNOSIS — Z950 Presence of cardiac pacemaker: Secondary | ICD-10-CM

## 2014-02-26 LAB — MDC_IDC_ENUM_SESS_TYPE_INCLINIC
Battery Remaining Longevity: 129.6 mo
Battery Voltage: 2.95 V
Brady Statistic RA Percent Paced: 0.11 %
Brady Statistic RV Percent Paced: 0.05 %
Date Time Interrogation Session: 20151026092832
Implantable Pulse Generator Model: 2110
Implantable Pulse Generator Serial Number: 2312578
Lead Channel Impedance Value: 450 Ohm
Lead Channel Impedance Value: 562.5 Ohm
Lead Channel Pacing Threshold Amplitude: 0.5 V
Lead Channel Pacing Threshold Amplitude: 0.875 V
Lead Channel Pacing Threshold Pulse Width: 0.5 ms
Lead Channel Pacing Threshold Pulse Width: 0.5 ms
Lead Channel Sensing Intrinsic Amplitude: 12 mV
Lead Channel Sensing Intrinsic Amplitude: 5 mV
Lead Channel Setting Pacing Amplitude: 1.125
Lead Channel Setting Pacing Amplitude: 1.5 V
Lead Channel Setting Pacing Pulse Width: 0.5 ms
Lead Channel Setting Sensing Sensitivity: 2 mV

## 2014-02-26 NOTE — Progress Notes (Signed)
Patient Care Team: Olin Hauser, MD as PCP - General (Unknown Physician Specialty)   HPI  Joshua Ali is a 73 y.o. male Seen in followup for pacemaker implantation 2011 because of syncope associated with a documented pause of greater than 30 seconds. Echocardiogram at that time demonstrated normal left ventricular function  No recurrent syncope   According to both him and his wife he is doing quite well. There has been no palpitations      Past Medical History  Diagnosis Date  . Syncope   . Sick sinus syndrome   . Diabetes mellitus     type II  . Cardiac pacemaker in situ   . Hyperlipidemia   . Arthritis   . Hypertension   . History of colon cancer   . Diastolic dysfunction     chronic    Past Surgical History  Procedure Laterality Date  . Insert / replace / remove pacemaker      Dual chamber St Jude Accent DR Model PMS 2110  . Right total knee replacement      Current Outpatient Prescriptions  Medication Sig Dispense Refill  . acetaminophen (TYLENOL) 650 MG CR tablet Take 650 mg by mouth every 8 (eight) hours as needed for pain.       [name] amLODipine (NORVASC) 5 MG tablet Take 5 mg by mouth daily.      [name] aspirin 81 MG tablet Take 81 mg by mouth daily.      [name] atorvastatin (LIPITOR) 40 MG tablet Take 40 mg by mouth at bedtime.       . calcium carbonate (OS-CAL) 600 MG TABS tablet Take 600 mg by mouth daily with breakfast.      . Cholecalciferol (VITAMIN D-3) 1000 UNITS CAPS Take 1 capsule by mouth daily.      . fluticasone (FLONASE) 50 MCG/ACT nasal spray Place 2 sprays into both nostrils as needed.       [name] glimepiride (AMARYL) 2 MG tablet Take 2 mg by mouth daily.       [name] loratadine (CLARITIN) 10 MG tablet Take 10 mg by mouth daily.      [name] losartan-hydrochlorothiazide (HYZAAR) 100-25 MG per tablet Take 1 tablet by mouth daily.        . metFORMIN (GLUCOPHAGE) 1000 MG tablet Take 1,000 mg by mouth 2 (two) times daily with a meal.        . Multiple Vitamin  (MULTIVITAMIN WITH MINERALS) TABS tablet Take 1 tablet by mouth daily.      [name] neomycin-bacitracin-polymyxin (NEOSPORIN) ointment Apply 1 application topically as needed for wound care. apply to eye       No current facility-administered medications for this visit.    Allergies  Allergen Reactions  . Oxycodone-Acetaminophen Other (See Comments)    Passes out     Review of Systems negative except from HPI and PMH  Physical Exam BP 150/80  Pulse 77  Ht 5\' 7"  (1.702 m)  Wt 193 lb 9.6 oz (87.816 kg)  BMI 30.31 kg/m2 Well developed and well nourished in no acute distress HENT normal E scleral and icterus clear Neck Supple JVP flat; carotids brisk and full Clear to ausculation  Regular rate and rhythm, no murmurs gallops or rub Soft with active bowel sounds No clubbing cyanosis none Edema Alert and oriented, grossly normal motor and sensory function Skin Warm and Dry  ECG from the emergency room is not available for review  Assessment and  Plan  Syncope-probably neurally mediated  Prolonged sinus pauses associated with #2   Status post pacemaker-St. Jude  Diabetes  Hypertension  Overall he has been doing very well. His blood pressure Is relatively elevated at 140-150 even at home. I suggested that he follow up with his PCP regarding his blood pressure as well as considering a sleep study.

## 2014-02-26 NOTE — Patient Instructions (Addendum)
Remote monitoring is used to monitor your Pacemaker of ICD from home. This monitoring reduces the number of office visits required to check your device to one time per year. It allows [place] to keep an eye on the functioning of your device to ensure it is working properly. You are scheduled for a device check from home on 05/30/13. You may send your transmission at any time that day. If you have a wireless device, the transmission will be sent automatically. After your physician reviews your transmission, you will receive a postcard with your next transmission date.  Your physician wants you to follow-up in: 1 year with Dr. Caryl Comes. You will receive a reminder letter in the mail two months in advance. If you don't receive a letter, please call our office to schedule the follow-up appointment.  Your physician recommends that you continue on your current medications as directed. Please refer to the Current Medication list given to you today.

## 2014-05-30 ENCOUNTER — Telehealth: Payer: Self-pay | Admitting: Cardiology

## 2014-05-30 ENCOUNTER — Encounter: Payer: Medicare Other | Admitting: *Deleted

## 2014-05-30 NOTE — Telephone Encounter (Signed)
LMOVM reminding pt to send remote transmission.   

## 2014-05-31 ENCOUNTER — Encounter: Payer: Self-pay | Admitting: Cardiology

## 2014-09-03 ENCOUNTER — Inpatient Hospital Stay (HOSPITAL_COMMUNITY)
Admission: EM | Admit: 2014-09-03 | Discharge: 2014-09-06 | DRG: 417 | Disposition: A | Discharge status: Home / Self Care | Payer: Medicare Other | Attending: Internal Medicine | Admitting: Internal Medicine

## 2014-09-03 ENCOUNTER — Emergency Department (HOSPITAL_COMMUNITY): Payer: Medicare Other

## 2014-09-03 ENCOUNTER — Encounter (HOSPITAL_COMMUNITY): Payer: Self-pay | Admitting: Emergency Medicine

## 2014-09-03 DIAGNOSIS — K8020 Calculus of gallbladder without cholecystitis without obstruction: Secondary | ICD-10-CM

## 2014-09-03 DIAGNOSIS — K851 Biliary acute pancreatitis without necrosis or infection: Secondary | ICD-10-CM | POA: Diagnosis present

## 2014-09-03 DIAGNOSIS — E119 Type 2 diabetes mellitus without complications: Secondary | ICD-10-CM

## 2014-09-03 DIAGNOSIS — K819 Cholecystitis, unspecified: Secondary | ICD-10-CM | POA: Diagnosis present

## 2014-09-03 DIAGNOSIS — R911 Solitary pulmonary nodule: Secondary | ICD-10-CM | POA: Diagnosis present

## 2014-09-03 DIAGNOSIS — I495 Sick sinus syndrome: Secondary | ICD-10-CM | POA: Diagnosis present

## 2014-09-03 DIAGNOSIS — R740 Nonspecific elevation of levels of transaminase and lactic acid dehydrogenase [LDH]: Secondary | ICD-10-CM | POA: Diagnosis not present

## 2014-09-03 DIAGNOSIS — I5032 Chronic diastolic (congestive) heart failure: Secondary | ICD-10-CM

## 2014-09-03 DIAGNOSIS — Z933 Colostomy status: Secondary | ICD-10-CM | POA: Diagnosis not present

## 2014-09-03 DIAGNOSIS — K8050 Calculus of bile duct without cholangitis or cholecystitis without obstruction: Secondary | ICD-10-CM

## 2014-09-03 DIAGNOSIS — I10 Essential (primary) hypertension: Secondary | ICD-10-CM | POA: Diagnosis present

## 2014-09-03 DIAGNOSIS — Z79899 Other long term (current) drug therapy: Secondary | ICD-10-CM | POA: Diagnosis not present

## 2014-09-03 DIAGNOSIS — E860 Dehydration: Secondary | ICD-10-CM | POA: Diagnosis present

## 2014-09-03 DIAGNOSIS — E785 Hyperlipidemia, unspecified: Secondary | ICD-10-CM | POA: Diagnosis present

## 2014-09-03 DIAGNOSIS — Z950 Presence of cardiac pacemaker: Secondary | ICD-10-CM

## 2014-09-03 DIAGNOSIS — K810 Acute cholecystitis: Secondary | ICD-10-CM | POA: Diagnosis not present

## 2014-09-03 DIAGNOSIS — Z7982 Long term (current) use of aspirin: Secondary | ICD-10-CM | POA: Diagnosis not present

## 2014-09-03 DIAGNOSIS — Z885 Allergy status to narcotic agent status: Secondary | ICD-10-CM

## 2014-09-03 DIAGNOSIS — K8062 Calculus of gallbladder and bile duct with acute cholecystitis without obstruction: Principal | ICD-10-CM | POA: Diagnosis present

## 2014-09-03 DIAGNOSIS — Z96651 Presence of right artificial knee joint: Secondary | ICD-10-CM | POA: Diagnosis present

## 2014-09-03 DIAGNOSIS — Z87891 Personal history of nicotine dependence: Secondary | ICD-10-CM | POA: Diagnosis not present

## 2014-09-03 DIAGNOSIS — E872 Acidosis, unspecified: Secondary | ICD-10-CM | POA: Diagnosis present

## 2014-09-03 DIAGNOSIS — R55 Syncope and collapse: Secondary | ICD-10-CM

## 2014-09-03 DIAGNOSIS — Z85038 Personal history of other malignant neoplasm of large intestine: Secondary | ICD-10-CM

## 2014-09-03 HISTORY — DX: Malignant (primary) neoplasm, unspecified: C80.1

## 2014-09-03 LAB — COMPREHENSIVE METABOLIC PANEL
ALT: 130 U/L — ABNORMAL HIGH (ref 17–63)
AST: 217 U/L — ABNORMAL HIGH (ref 15–41)
Albumin: 3.6 g/dL (ref 3.5–5.0)
Alkaline Phosphatase: 144 U/L — ABNORMAL HIGH (ref 38–126)
Anion gap: 12 (ref 5–15)
BUN: 23 mg/dL — ABNORMAL HIGH (ref 6–20)
CO2: 26 mmol/L (ref 22–32)
Calcium: 9.5 mg/dL (ref 8.9–10.3)
Chloride: 98 mmol/L — ABNORMAL LOW (ref 101–111)
Creatinine, Ser: 1.04 mg/dL (ref 0.61–1.24)
GFR calc Af Amer: >60 mL/min (ref >60)
GFR calc non Af Amer: >60 mL/min (ref >60)
Glucose, Bld: 224 mg/dL — ABNORMAL HIGH (ref 70–99)
Potassium: 3.7 mmol/L (ref 3.5–5.1)
Sodium: 136 mmol/L (ref 135–145)
Total Bilirubin: 1.3 mg/dL — ABNORMAL HIGH (ref 0.3–1.2)
Total Protein: 7.2 g/dL (ref 6.5–8.1)

## 2014-09-03 LAB — CBC WITH DIFFERENTIAL/PLATELET
Basophils Absolute: 0 10*3/uL (ref 0.0–0.1)
Basophils Relative: 0 % (ref 0–1)
Eosinophils Absolute: 0.1 10*3/uL (ref 0.0–0.7)
Eosinophils Relative: 1 % (ref 0–5)
HCT: 38.9 % — ABNORMAL LOW (ref 39.0–52.0)
Hemoglobin: 13.6 g/dL (ref 13.0–17.0)
Lymphocytes Relative: 9 % — ABNORMAL LOW (ref 12–46)
Lymphs Abs: 1 10*3/uL (ref 0.7–4.0)
MCH: 30.2 pg (ref 26.0–34.0)
MCHC: 35 g/dL (ref 30.0–36.0)
MCV: 86.4 fL (ref 78.0–100.0)
Monocytes Absolute: 1 10*3/uL (ref 0.1–1.0)
Monocytes Relative: 9 % (ref 3–12)
Neutro Abs: 8.6 10*3/uL — ABNORMAL HIGH (ref 1.7–7.7)
Neutrophils Relative %: 81 % — ABNORMAL HIGH (ref 43–77)
Platelets: 282 10*3/uL (ref 150–400)
RBC: 4.5 MIL/uL (ref 4.22–5.81)
RDW: 11.9 % (ref 11.5–15.5)
WBC: 10.7 10*3/uL — ABNORMAL HIGH (ref 4.0–10.5)

## 2014-09-03 LAB — GLUCOSE, CAPILLARY
Glucose-Capillary: 150 mg/dL — ABNORMAL HIGH (ref 70–99)
Glucose-Capillary: 158 mg/dL — ABNORMAL HIGH (ref 70–99)
Glucose-Capillary: 179 mg/dL — ABNORMAL HIGH (ref 70–99)
Glucose-Capillary: 96 mg/dL (ref 70–99)

## 2014-09-03 LAB — URINALYSIS, ROUTINE W REFLEX MICROSCOPIC
Bilirubin Urine: NEGATIVE
Glucose, UA: 100 mg/dL — AB
Hgb urine dipstick: NEGATIVE
Ketones, ur: 15 mg/dL — AB
Leukocytes, UA: NEGATIVE
Nitrite: NEGATIVE
Protein, ur: 30 mg/dL — AB
Specific Gravity, Urine: 1.026 (ref 1.005–1.030)
Urobilinogen, UA: 0.2 mg/dL (ref 0.0–1.0)
pH: 5 (ref 5.0–8.0)

## 2014-09-03 LAB — CBG MONITORING, ED: Glucose-Capillary: 169 mg/dL — ABNORMAL HIGH (ref 70–99)

## 2014-09-03 LAB — I-STAT TROPONIN, ED: Troponin i, poc: 0 ng/mL (ref 0.00–0.08)

## 2014-09-03 LAB — LIPASE, BLOOD: Lipase: 130 U/L — ABNORMAL HIGH (ref 22–51)

## 2014-09-03 LAB — URINE MICROSCOPIC-ADD ON

## 2014-09-03 LAB — SURGICAL PCR SCREEN
MRSA, PCR: NEGATIVE
Staphylococcus aureus: NEGATIVE

## 2014-09-03 LAB — I-STAT CG4 LACTIC ACID, ED
Lactic Acid, Venous: 2.13 mmol/L (ref 0.5–2.0)
Lactic Acid, Venous: 1.62 mmol/L (ref 0.5–2.0)

## 2014-09-03 MED ORDER — ALUM & MAG HYDROXIDE-SIMETH 200-200-20 MG/5ML PO SUSP: 30.0000 mL | Freq: Four times a day (QID) | ORAL | Status: DC | PRN

## 2014-09-03 MED ORDER — MORPHINE SULFATE 2 MG/ML IJ SOLN
2.0000 mg | INTRAMUSCULAR | Status: DC | PRN
  Administered 2014-09-03 – 2014-09-04 (×6): 4 mg via INTRAVENOUS
  Filled 2014-09-03 (×6): qty 2

## 2014-09-03 MED ORDER — LOSARTAN POTASSIUM 50 MG PO TABS
100.0000 mg | ORAL_TABLET | Freq: Every day | ORAL | Status: DC
  Administered 2014-09-03 – 2014-09-06 (×2): 100 mg via ORAL
  Filled 2014-09-03 (×2): qty 2

## 2014-09-03 MED ORDER — HEPARIN SODIUM (PORCINE) 5000 UNIT/ML IJ SOLN
5000.0000 [IU] | Freq: Three times a day (TID) | INTRAMUSCULAR | Status: DC
  Administered 2014-09-03 – 2014-09-06 (×7): 5000 [IU] via SUBCUTANEOUS
  Filled 2014-09-03 (×7): qty 1

## 2014-09-03 MED ORDER — INSULIN ASPART 100 UNIT/ML ~~LOC~~ SOLN
0.0000 [IU] | SUBCUTANEOUS | Status: DC
  Administered 2014-09-03 (×2): 3 [IU] via SUBCUTANEOUS
  Administered 2014-09-03: 2 [IU] via SUBCUTANEOUS
  Administered 2014-09-04 (×3): 3 [IU] via SUBCUTANEOUS
  Administered 2014-09-04 (×2): 2 [IU] via SUBCUTANEOUS
  Administered 2014-09-05 (×3): 3 [IU] via SUBCUTANEOUS
  Administered 2014-09-05: 2 [IU] via SUBCUTANEOUS
  Administered 2014-09-05: 5 [IU] via SUBCUTANEOUS
  Administered 2014-09-06 (×3): 3 [IU] via SUBCUTANEOUS
  Filled 2014-09-03: qty 1

## 2014-09-03 MED ORDER — THIAMINE HCL 100 MG/ML IJ SOLN
100.0000 mg | Freq: Every day | INTRAMUSCULAR | Status: DC
  Administered 2014-09-03 – 2014-09-06 (×3): 100 mg via INTRAVENOUS
  Filled 2014-09-03 (×3): qty 2

## 2014-09-03 MED ORDER — IOHEXOL 300 MG/ML  SOLN: 25.0000 mL | Freq: Once | INTRAMUSCULAR | Status: DC | PRN

## 2014-09-03 MED ORDER — ONDANSETRON HCL 4 MG/2ML IJ SOLN
4.0000 mg | Freq: Once | INTRAMUSCULAR | Status: AC
  Administered 2014-09-03: 4 mg via INTRAVENOUS
  Filled 2014-09-03: qty 2

## 2014-09-03 MED ORDER — IOHEXOL 300 MG/ML  SOLN
100.0000 mL | Freq: Once | INTRAMUSCULAR | Status: AC | PRN
  Administered 2014-09-03: 100 mL via INTRAVENOUS

## 2014-09-03 MED ORDER — MORPHINE SULFATE 4 MG/ML IJ SOLN
6.0000 mg | Freq: Once | INTRAMUSCULAR | Status: AC
  Administered 2014-09-03: 6 mg via INTRAVENOUS
  Filled 2014-09-03: qty 2

## 2014-09-03 MED ORDER — ONDANSETRON HCL 4 MG/2ML IJ SOLN
4.0000 mg | INTRAMUSCULAR | Status: DC | PRN
Start: 2014-09-03 — End: 2014-09-06

## 2014-09-03 MED ORDER — HYDROCODONE-ACETAMINOPHEN 5-325 MG PO TABS
1.0000 | ORAL_TABLET | Freq: Once | ORAL | Status: AC
  Administered 2014-09-03: 1 via ORAL
  Filled 2014-09-03: qty 1

## 2014-09-03 MED ORDER — PIPERACILLIN-TAZOBACTAM 3.375 G IVPB
3.3750 g | Freq: Three times a day (TID) | INTRAVENOUS | Status: DC
  Administered 2014-09-03 – 2014-09-04 (×3): 3.375 g via INTRAVENOUS
  Filled 2014-09-03 (×4): qty 50

## 2014-09-03 MED ORDER — FOLIC ACID 5 MG/ML IJ SOLN
1.0000 mg | Freq: Every day | INTRAMUSCULAR | Status: DC
  Administered 2014-09-03: 1 mg via INTRAVENOUS
  Filled 2014-09-03 (×7): qty 0.2

## 2014-09-03 MED ORDER — AMLODIPINE BESYLATE 5 MG PO TABS
5.0000 mg | ORAL_TABLET | Freq: Every day | ORAL | Status: DC
  Administered 2014-09-03 – 2014-09-06 (×3): 5 mg via ORAL
  Filled 2014-09-03 (×4): qty 1

## 2014-09-03 MED ORDER — KCL IN DEXTROSE-NACL 20-5-0.9 MEQ/L-%-% IV SOLN
INTRAVENOUS | Status: DC
  Administered 2014-09-03 – 2014-09-04 (×5): via INTRAVENOUS
  Administered 2014-09-05: 125 mL/h via INTRAVENOUS
  Administered 2014-09-06: 04:00:00 via INTRAVENOUS
  Filled 2014-09-03 (×11): qty 1000

## 2014-09-03 MED ORDER — PIPERACILLIN-TAZOBACTAM 3.375 G IVPB 30 MIN
3.3750 g | Freq: Once | INTRAVENOUS | Status: AC
  Administered 2014-09-03: 3.375 g via INTRAVENOUS
  Filled 2014-09-03: qty 50

## 2014-09-03 MED ORDER — MORPHINE SULFATE 4 MG/ML IJ SOLN
4.0000 mg | Freq: Once | INTRAMUSCULAR | Status: AC
  Administered 2014-09-03: 4 mg via INTRAVENOUS
  Filled 2014-09-03: qty 1

## 2014-09-03 NOTE — ED Provider Notes (Signed)
Patient seen/examined in the Emergency Department in conjunction with Midlevel Provider  Patient reports diffuse abd pain Exam : awake/alert, diffuse abd tenderness Plan: CT imaging pending    Ripley Fraise, MD 09/03/14 [phone]

## 2014-09-03 NOTE — Consult Note (Signed)
Reason for Consult: acute cholecystitis Referring Physician: Dr. Sherwood Gambler    HPI: Joshua Ali is a 74 year old male with a history of syncope s/p pacemaker, rectal cancer colon resection and colostomy in [place] in 2001, DM II, HTN presenting with abdominal pain.  He reports intermittent abdominal pain over the past 2 weeks.  In the last week, he reports subjective fevers, malaise and "flu like symptoms."  Last night at 1AM, he developed severe RLQ and epigastric abdominal pain.  Characterized as sharp and stabbing pain.  No radiation.  Modifying factors include meloxicam.  Aggravated by movement, not food.  No alleviating factors.  Time pattern is constant.  Denies fever or chills in recent days.  Appetite has been good.  Denies diarrhea or constipation.  He attributed symptoms initially to his colostomy which he gets intermittently and best characterized as constipation or dysmotility.  Denies denies sob, chest pains or DOE.  Denies history of CHF or MI.  He has been NPO since last night.   Evaluation reveals WBC 10.7K, abnormal LFTS with T bili 1.3, lipase 130.  Normal renal function.  Normal h&h.  CT of abdomen and pelvis with cholelithiasis with diffuse gallbladder wall thickening and surrounding inflammatory changes.  We have therefore been asked to evaluate.   Past Medical History  Diagnosis Date  . Syncope   . Sick sinus syndrome   . Diabetes mellitus     type II  . Cardiac pacemaker in situ   . Hyperlipidemia   . Arthritis   . Hypertension   . History of colon cancer   . Diastolic dysfunction     chronic  . Cancer     Past Surgical History  Procedure Laterality Date  . Insert / replace / remove pacemaker      Dual chamber St Jude Accent DR Model PMS 2110  . Right total knee replacement    . Colostomy  2001    Family History  Problem Relation Age of Onset  . Heart disease Father   . Heart attack Father   . Hypertension Brother   . Cancer Maternal Grandfather    . Hypertension Brother     Social History:  reports that he has quit smoking. He does not have any smokeless tobacco history on file. He reports that he drinks alcohol. He reports that he does not use illicit drugs.  Allergies:  Allergies  Allergen Reactions  . Oxycodone-Acetaminophen Other (See Comments)    Passes out     Medications:  Prior to Admission medications   Medication Sig Start Date End Date Taking? Authorizing Provider  acetaminophen (TYLENOL) 650 MG CR tablet Take 650 mg by mouth every 8 (eight) hours as needed for pain.    Yes Historical Provider, MD  amLODipine (NORVASC) 5 MG tablet Take 5 mg by mouth daily.   Yes Historical Provider, MD  aspirin 81 MG tablet Take 81 mg by mouth daily.   Yes Historical Provider, MD  atorvastatin (LIPITOR) 40 MG tablet Take 40 mg by mouth at bedtime.    Yes Historical Provider, MD  calcium carbonate (OS-CAL) 600 MG TABS tablet Take 600 mg by mouth daily with breakfast.   Yes Historical Provider, MD  cetirizine (ZYRTEC) 10 MG tablet Take 10 mg by mouth daily.   Yes Historical Provider, MD  Cholecalciferol (VITAMIN D-3) 1000 UNITS CAPS Take 1 capsule by mouth daily.   Yes Historical Provider, MD  fluticasone (FLONASE) 50 MCG/ACT nasal spray Place 2 sprays  into both nostrils as needed for allergies or rhinitis.    Yes Historical Provider, MD  glimepiride (AMARYL) 2 MG tablet Take 2 mg by mouth daily.    Yes Historical Provider, MD  losartan-hydrochlorothiazide (HYZAAR) 100-25 MG per tablet Take 1 tablet by mouth daily.     Yes Historical Provider, MD  meloxicam (MOBIC) 7.5 MG tablet Take 7.5 mg by mouth daily.   Yes Historical Provider, MD  metFORMIN (GLUCOPHAGE) 1000 MG tablet Take 1,000 mg by mouth 2 (two) times daily with a meal.     Yes Historical Provider, MD  Multiple Vitamin (MULTIVITAMIN WITH MINERALS) TABS tablet Take 1 tablet by mouth daily.   Yes Historical Provider, MD  neomycin-bacitracin-polymyxin (NEOSPORIN) ointment Apply 1  application topically as needed for wound care. apply to eye   Yes Historical Provider, MD     Results for orders placed or performed during the hospital encounter of 09/03/14 (from the past 48 hour(s))  CBC with Differential     Status: Abnormal   Collection Time: 09/03/14  5:08 AM  Result Value Ref Range   WBC 10.7 (H) 4.0 - 10.5 K/uL   RBC 4.50 4.22 - 5.81 MIL/uL   Hemoglobin 13.6 13.0 - 17.0 g/dL   HCT 38.9 (L) 39.0 - 52.0 %   MCV 86.4 78.0 - 100.0 fL   MCH 30.2 26.0 - 34.0 pg   MCHC 35.0 30.0 - 36.0 g/dL   RDW 11.9 11.5 - 15.5 %   Platelets 282 150 - 400 K/uL   Neutrophils Relative % 81 (H) 43 - 77 %   Neutro Abs 8.6 (H) 1.7 - 7.7 K/uL   Lymphocytes Relative 9 (L) 12 - 46 %   Lymphs Abs 1.0 0.7 - 4.0 K/uL   Monocytes Relative 9 3 - 12 %   Monocytes Absolute 1.0 0.1 - 1.0 K/uL   Eosinophils Relative 1 0 - 5 %   Eosinophils Absolute 0.1 0.0 - 0.7 K/uL   Basophils Relative 0 0 - 1 %   Basophils Absolute 0.0 0.0 - 0.1 K/uL  Comprehensive metabolic panel     Status: Abnormal   Collection Time: 09/03/14  5:08 AM  Result Value Ref Range   Sodium 136 135 - 145 mmol/L   Potassium 3.7 3.5 - 5.1 mmol/L   Chloride 98 (L) 101 - 111 mmol/L   CO2 26 22 - 32 mmol/L   Glucose, Bld 224 (H) 70 - 99 mg/dL   BUN 23 (H) 6 - 20 mg/dL   Creatinine, Ser 1.04 0.61 - 1.24 mg/dL   Calcium 9.5 8.9 - 10.3 mg/dL   Total Protein 7.2 6.5 - 8.1 g/dL   Albumin 3.6 3.5 - 5.0 g/dL   AST 217 (H) 15 - 41 U/L   ALT 130 (H) 17 - 63 U/L   Alkaline Phosphatase 144 (H) 38 - 126 U/L   Total Bilirubin 1.3 (H) 0.3 - 1.2 mg/dL   GFR calc non Af Amer >60 >60 mL/min   GFR calc Af Amer >60 >60 mL/min    Comment: (NOTE) The eGFR has been calculated using the CKD EPI equation. This calculation has not been validated in all clinical situations. eGFR's persistently <90 mL/min signify possible Chronic Kidney Disease.    Anion gap 12 5 - 15  Lipase, blood     Status: Abnormal   Collection Time: 09/03/14  5:08 AM   Result Value Ref Range   Lipase 130 (H) 22 - 51 U/L  I-stat troponin, ED (  only if pt is 72 y.o. or older & pain is above umbilicus)  not at [facility], [facility]     Status: None   Collection Time: 09/03/14  5:10 AM  Result Value Ref Range   Troponin i, poc 0.00 0.00 - 0.08 ng/mL   Comment 3            Comment: Due to the release kinetics of cTnI, a negative result within the first hours of the onset of symptoms does not rule out myocardial infarction with certainty. If myocardial infarction is still suspected, repeat the test at appropriate intervals.   I-Stat CG4 Lactic Acid, ED     Status: Abnormal   Collection Time: 09/03/14  6:27 AM  Result Value Ref Range   Lactic Acid, Venous 2.13 (HH) 0.5 - 2.0 mmol/L   Comment NOTIFIED PHYSICIAN   Urinalysis, Routine w reflex microscopic     Status: Abnormal   Collection Time: 09/03/14  6:50 AM  Result Value Ref Range   Color, Urine YELLOW YELLOW   APPearance CLEAR CLEAR   Specific Gravity, Urine 1.026 1.005 - 1.030   pH 5.0 5.0 - 8.0   Glucose, UA 100 (A) NEGATIVE mg/dL   Hgb urine dipstick NEGATIVE NEGATIVE   Bilirubin Urine NEGATIVE NEGATIVE   Ketones, ur 15 (A) NEGATIVE mg/dL   Protein, ur 30 (A) NEGATIVE mg/dL   Urobilinogen, UA 0.2 0.0 - 1.0 mg/dL   Nitrite NEGATIVE NEGATIVE   Leukocytes, UA NEGATIVE NEGATIVE  Urine microscopic-add on     Status: Abnormal   Collection Time: 09/03/14  6:50 AM  Result Value Ref Range   Squamous Epithelial / LPF RARE RARE   WBC, UA 0-2 <3 WBC/hpf   Bacteria, UA FEW (A) RARE  I-Stat CG4 Lactic Acid, ED     Status: None   Collection Time: 09/03/14  9:46 AM  Result Value Ref Range   Lactic Acid, Venous 1.62 0.5 - 2.0 mmol/L  CBG monitoring, ED     Status: Abnormal   Collection Time: 09/03/14 10:36 AM  Result Value Ref Range   Glucose-Capillary 169 (H) 70 - 99 mg/dL   Comment 1 Notify RN     Ct Abdomen Pelvis W Contrast  09/03/2014   CLINICAL DATA:  Right-sided abdominal pain for 1 week. History of  colorectal cancer.  EXAM: CT ABDOMEN AND PELVIS WITH CONTRAST  TECHNIQUE: Multidetector CT imaging of the abdomen and pelvis was performed using the standard protocol following bolus administration of intravenous contrast.  CONTRAST:  137m OMNIPAQUE IOHEXOL 300 MG/ML  SOLN  COMPARISON:  None.  FINDINGS: There is a 3 mm nodule in the left upper lobe on sequence 3, image 13. Dependent atelectasis in the lower lobes bilaterally. No evidence for pleural effusions. Negative for free air.  There is diffuse wall thickening of the gallbladder with surrounding inflammation. Small calcified stones within the gallbladder. Gallbladder wall thickness measures up to 7 mm. There is minimal intrahepatic biliary dilatation. Portal venous system is patent. No significant dilatation of the extrahepatic bile duct.  Normal appearance of the pancreas, spleen and adrenal glands. Normal appearance of the right kidney. 1.6 cm low-density structure in the left kidney is suggestive for a cyst. Negative for hydronephrosis.  Calcifications in the coronary arteries.  Cardiac pacemaker leads.  There is irregular soft tissue with calcifications in the presacral space. Suspect this is related to treatment changes from colon cancer. Soft tissue in this area measures 3.1 cm in the AP dimension. Small amount of  fluid in the urinary bladder. The rectum appears to be surgically absent. Patient has a descending colostomy. There is some high-density material in the appendix but no evidence for appendix enlargement or inflammation. Normal appearance of the small bowel.  No acute bone abnormality. 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  IMPRESSION: Cholelithiasis with diffuse gallbladder wall thickening and surrounding inflammatory changes. Findings are suggestive for acute cholecystitis.  Postsurgical changes related to colon cancer with a descending colostomy. Soft tissue thickening with calcifications in the presacral  space is compatible with post treatment changes.  Indeterminate 3 mm nodule in the left upper lobe. If the patient is at high risk for bronchogenic carcinoma, follow-up chest CT at 1 year is recommended. If the patient is at low risk, no follow-up is needed. This recommendation follows the consensus statement: Guidelines for Management of Small Pulmonary Nodules Detected on CT Scans: A Statement from the [employer] as published in Radiology 2005; 237:395-400.   Electronically Signed   By: Markus Daft M.D.   On: 09/03/2014 09:05   Dg Chest Port 1 View  09/03/2014   CLINICAL DATA:  Chest pain and generalized abdominal pain since 08/24/2014.  EXAM: PORTABLE CHEST - 1 VIEW  COMPARISON:  07/04/2013  FINDINGS: There is a shallow inspiration with mild associated crowding of the basilar markings. There is no confluent airspace consolidation. There is no large effusion.  There are intact appearances of the dual-chamber transvenous leads. Hilar, mediastinal and cardiac contours are unremarkable and unchanged  IMPRESSION: No acute cardiopulmonary findings.  Shallow inspiration.   Electronically Signed   By: Andreas Newport M.D.   On: 09/03/2014 05:44    Review of Systems  All other systems reviewed and are negative.  Blood pressure 132/71, pulse 99, temperature 97.4 F (36.3 C), temperature source Oral, resp. rate 18, height _0  (1.702 m), weight 83.915 kg (185 lb), SpO2 94 %. Physical Exam  Constitutional: He is oriented to person, place, and time. He appears well-developed and well-nourished. No distress.  HENT:  Head: Atraumatic.  Mouth/Throat: No oropharyngeal exudate.  Eyes: Left eye exhibits no discharge. No scleral icterus.  Cardiovascular: Normal rate, regular rhythm, normal heart sounds and intact distal pulses.  Exam reveals no gallop and no friction rub.   No murmur heard. Respiratory: Effort normal and breath sounds normal. No respiratory distress. He has no wheezes.  GI: Soft. Bowel  sounds are normal. He exhibits no distension.  +murphys sign.  TTP RUQ, RLQ, minimally tender to LUQ. LLQ colostomy.  Musculoskeletal: Normal range of motion. He exhibits no edema or tenderness.  Neurological: He is alert and oriented to person, place, and time.  Skin: Skin is warm and dry. No rash noted. He is not diaphoretic. No erythema. No pallor.  Psychiatric: He has a normal mood and affect. His behavior is normal. Judgment and thought content normal.    Assessment/Plan: Calculous cholecystitis: cholecystectomy versus a cholecystostomy tube given duration of symptoms, likely at an increased risks of surgical complications.  Continue with zosyn, bowel rest, IVF, pain control.  He has a pacemaker for SSS, denies MI or DOE.  Cardiology clearance is not warranted at this time.  Further recommendations to follow following discussion with Dr. Ninfa Linden.     Sudeep Scheibel ANP-BC 09/03/2014, 10:42 AM

## 2014-09-03 NOTE — ED Notes (Signed)
Dr. Stefano Gaul notified of pt's request for pain medication and allergy to Percocet.

## 2014-09-03 NOTE — ED Provider Notes (Signed)
CSN: [id number]     Arrival date & time 09/03/14  0434 History   First MD Initiated Contact with Patient 09/03/14 0601     Chief Complaint  Patient presents with  . Chest Pain  . Abdominal Pain     (Consider location/radiation/quality/duration/timing/severity/associated sxs/prior Treatment) HPI Patient presents to the emergency department with right upper quadrant abdominal pain is diffuse throughout his abdomen, but mainly on the right.  The patient states that he started feeling bad about a week ago and has progressively gotten worse.  Patient states that he has not had any chest pain but feels pressure underneath his diaphragm.  Patient states that nothing seems make his condition, better with movement and palpation make the pain worse.  He is also had several episodes of vomiting.  No fevers, weakness, dizziness, blurred vision, headache, back pain, neck pain, cough, dysuria, bloody stool, diarrhea, or syncope.  The patient thought he was having gas pain and now believes that he has bowel obstruction due to his previous bowel surgeries Past Medical History  Diagnosis Date  . Syncope   . Sick sinus syndrome   . Diabetes mellitus     type II  . Cardiac pacemaker in situ   . Hyperlipidemia   . Arthritis   . Hypertension   . History of colon cancer   . Diastolic dysfunction     chronic   Past Surgical History  Procedure Laterality Date  . Insert / replace / remove pacemaker      Dual chamber St Jude Accent DR Model PMS 2110  . Right total knee replacement    . Colostomy  2001   Family History  Problem Relation Age of Onset  . Heart disease Father   . Heart attack Father   . Hypertension Brother   . Cancer Maternal Grandfather   . Hypertension Brother    History  Substance Use Topics  . Smoking status: Former Research scientist (life sciences)  . Smokeless tobacco: Not on file  . Alcohol Use: Yes     Comment: occasional beer    Review of Systems  All other systems negative except as documented  in the HPI. All pertinent positives and negatives as reviewed in the HPI.  Allergies  Oxycodone-acetaminophen  Home Medications   Prior to Admission medications   Medication Sig Start Date End Date Taking? Authorizing Provider  acetaminophen (TYLENOL) 650 MG CR tablet Take 650 mg by mouth every 8 (eight) hours as needed for pain.    Yes Historical Provider, MD  amLODipine (NORVASC) 5 MG tablet Take 5 mg by mouth daily.   Yes Historical Provider, MD  aspirin 81 MG tablet Take 81 mg by mouth daily.   Yes Historical Provider, MD  atorvastatin (LIPITOR) 40 MG tablet Take 40 mg by mouth at bedtime.    Yes Historical Provider, MD  calcium carbonate (OS-CAL) 600 MG TABS tablet Take 600 mg by mouth daily with breakfast.   Yes Historical Provider, MD  cetirizine (ZYRTEC) 10 MG tablet Take 10 mg by mouth daily.   Yes Historical Provider, MD  Cholecalciferol (VITAMIN D-3) 1000 UNITS CAPS Take 1 capsule by mouth daily.   Yes Historical Provider, MD  fluticasone (FLONASE) 50 MCG/ACT nasal spray Place 2 sprays into both nostrils as needed for allergies or rhinitis.    Yes Historical Provider, MD  glimepiride (AMARYL) 2 MG tablet Take 2 mg by mouth daily.    Yes Historical Provider, MD  losartan-hydrochlorothiazide (HYZAAR) 100-25 MG per tablet Take 1 tablet  by mouth daily.     Yes Historical Provider, MD  meloxicam (MOBIC) 7.5 MG tablet Take 7.5 mg by mouth daily.   Yes Historical Provider, MD  metFORMIN (GLUCOPHAGE) 1000 MG tablet Take 1,000 mg by mouth 2 (two) times daily with a meal.     Yes Historical Provider, MD  Multiple Vitamin (MULTIVITAMIN WITH MINERALS) TABS tablet Take 1 tablet by mouth daily.   Yes Historical Provider, MD  neomycin-bacitracin-polymyxin (NEOSPORIN) ointment Apply 1 application topically as needed for wound care. apply to eye   Yes Historical Provider, MD   BP 131/70 mmHg  Pulse 98  Temp(Src) 97.4 F (36.3 C) (Oral)  Resp 26  Ht 5\' 7"  (1.702 m)  Wt 185 lb (83.915 kg)  BMI  28.97 kg/m2  SpO2 97% Physical Exam  Constitutional: He is oriented to person, place, and time. He appears well-developed and well-nourished. No distress.  HENT:  Head: Normocephalic and atraumatic.  Mouth/Throat: Oropharynx is clear and moist.  Eyes: Pupils are equal, round, and reactive to light.  Neck: Normal range of motion. Neck supple.  Cardiovascular: Normal rate, regular rhythm and normal heart sounds.  Exam reveals no gallop and no friction rub.   No murmur heard. Pulmonary/Chest: Effort normal and breath sounds normal. No respiratory distress.  Abdominal: Soft. Normal appearance and bowel sounds are normal. He exhibits no distension. There is generalized tenderness. There is guarding. There is no rebound and no CVA tenderness. No hernia.    Neurological: He is alert and oriented to person, place, and time. He exhibits normal muscle tone. Coordination normal.  Skin: Skin is warm and dry. No rash noted. No erythema.  Psychiatric: He has a normal mood and affect. His behavior is normal.  Nursing note and vitals reviewed.   ED Course  Procedures (including critical care time) Labs Review Labs Reviewed  CBC WITH DIFFERENTIAL/PLATELET - Abnormal; Notable for the following:    WBC 10.7 (*)    HCT 38.9 (*)    Neutrophils Relative % 81 (*)    Neutro Abs 8.6 (*)    Lymphocytes Relative 9 (*)    All other components within normal limits  COMPREHENSIVE METABOLIC PANEL - Abnormal; Notable for the following:    Chloride 98 (*)    Glucose, Bld 224 (*)    BUN 23 (*)    AST 217 (*)    ALT 130 (*)    Alkaline Phosphatase 144 (*)    Total Bilirubin 1.3 (*)    All other components within normal limits  LIPASE, BLOOD - Abnormal; Notable for the following:    Lipase 130 (*)    All other components within normal limits  URINALYSIS, ROUTINE W REFLEX MICROSCOPIC - Abnormal; Notable for the following:    Glucose, UA 100 (*)    Ketones, ur 15 (*)    Protein, ur 30 (*)    All other  components within normal limits  URINE MICROSCOPIC-ADD ON - Abnormal; Notable for the following:    Bacteria, UA FEW (*)    All other components within normal limits  I-STAT CG4 LACTIC ACID, ED - Abnormal; Notable for the following:    Lactic Acid, Venous 2.13 (*)    All other components within normal limits  I-STAT TROPOININ, ED    Imaging Review Dg Chest Port 1 View  09/03/2014   CLINICAL DATA:  Chest pain and generalized abdominal pain since 08/24/2014.  EXAM: PORTABLE CHEST - 1 VIEW  COMPARISON:  07/04/2013  FINDINGS: There is  a shallow inspiration with mild associated crowding of the basilar markings. There is no confluent airspace consolidation. There is no large effusion.  There are intact appearances of the dual-chamber transvenous leads. Hilar, mediastinal and cardiac contours are unremarkable and unchanged  IMPRESSION: No acute cardiopulmonary findings.  Shallow inspiration.   Electronically Signed   By: Andreas Newport M.D.   On: 09/03/2014 05:44     EKG Interpretation   Date/Time:  Monday Sep 03 2014 04:39:45 EDT Ventricular Rate:  85 PR Interval:  187 QRS Duration: 82 QT Interval:  353 QTC Calculation: 420 R Axis:   62 Text Interpretation:  Sinus rhythm Atrial premature complex No significant  change since last tracing artifact noted Confirmed by Christy Gentles  MD, DONALD  [phone]) on 09/03/2014 4:46:44 AM      MDM   Final diagnoses:  None   Rechecked the patient at 8 AM the patient was still having some pain, but felt improvement from his symptoms with the morphine.  I advised him of the test results that we have back at this time and the current working plan and asked patient if he needed any further pain control.  All questions were answered.  I spoke with general surgery about the patient, along with hospital.  Patient will be admitted.  Patient been IV antibiotics  Dalia Heading, PA-C 09/05/14 [place], MD 09/07/14 [phone]

## 2014-09-03 NOTE — ED Notes (Signed)
Notified RN of CBG 169

## 2014-09-03 NOTE — ED Notes (Signed)
C/o chest pain and generalized abd pain since 4/22 with nausea and sob.  Reports vomited on 4/22 but hasn't vomited since then.  Reports diaphoresis tonight.  Pt has colostomy and is concerned about a blockage.  Tried laxatives without relief of pain.

## 2014-09-03 NOTE — Progress Notes (Signed)
Pt admitted to 6N27 via stretcher from ED.  Pt walked from strectcher to room. Pt AAO X4.  Pt has 20G to Rt hand with fluids infusing.  Pt walked to bathroom once he got in the room.  Pt has no questions at the moment.  Family at bedside.  Will continue to monitor.  Glade Nurse, RN

## 2014-09-03 NOTE — H&P (Signed)
[facility] History and Physical                                                                                    Joshua Ali, is a 74 y.o. male  MRN: [id number]   DOB - March 24, 1941  Admit Date - 09/03/2014  Outpatient Primary MD for the patient is Olin Hauser, MD  Referring MD: Dr. Regenia Skeeter / ER  With History of -  Past Medical History  Diagnosis Date  . Syncope   . Sick sinus syndrome   . Diabetes mellitus     type II  . Cardiac pacemaker in situ   . Hyperlipidemia   . Arthritis   . Hypertension   . History of colon cancer   . Diastolic dysfunction     chronic  . Cancer       Past Surgical History  Procedure Laterality Date  . Insert / replace / remove pacemaker      Dual chamber St Jude Accent DR Model PMS 2110  . Right total knee replacement    . Colostomy  2001    in for   Chief Complaint  Patient presents with  . Chest Pain  . Abdominal Pain     HPI This is a 74 year old male patient with past medical history sick sinus syndrome with subsequent pacemaker, history of colon cancer with colostomy after colon resection, diabetes mellitus on oral agents, hypertension and chronic grade 1 diastolic heart failure. Patient presents with diffuse abdominal pain ongoing for at least 4 days. He reports about one week ago he felt like he may have had the flu; had subjective fevers and generalized aches and began having abdominal pain primarily on the right side radiating to the back but was not severe in nature at that time. About 4 days ago the pain became his most prominent complaint and he noticed decreased appetite with occasional emesis with eating and began to self limit his intake. He also noticed the pain was significant enough he could lay on his right side. He describes this pain as being very sharp. He has a colostomy and has no change in his stool character or color. He does report that recently there was a death in the family and people began bringing  foods that have higher fat content than what he and his wife usually consume. In regards to his chronic medical problems his CBGs typically run between 135 and 145 and hemoglobin A1c is typically 6.5. He saw his cardiologist to decline last September and had no acute issues and plan was to return in one year. His blood pressure remains well controlled.  On presentation to the ER he was afebrile and hemodynamically stable and maintaining room air saturations of 97%. His laboratory data was unremarkable except for mild elevation of BUN of 23, glucose 224, white count of 10,700 with a neutrophil count 81% but more concerning was abnormal transaminases with alk phosphatase of 144, lipase 1:30, AST 217 and ALT 1:30 with a total bilirubin 1.3. This along with clinical exam consistent with reproducible abdominal pain (more prominent right upper quadrant) prompted the EDP to obtain a CT of the abdomen  and pelvis given patient also had abdominal distention. This revealed cholelithiasis with diffuse gallbladder wall thickening and surrounding inflammatory changes concerning for acute cholecystitis. Incidental finding of an indeterminant 3 mm nodule left upper lobe. A lactic acid was checked and was 2.13 and repeat 3 hours later was 1.62. CXR without acute findings. General surgery has been called and they requested [facility] admit the patient due to his multiple medical problems.   Review of Systems   In addition to the HPI above,  No Headache, changes with Vision or hearing, new weakness, tingling, numbness in any extremity, No problems swallowing food or Liquids, indigestion/reflux No Chest pain, Cough or Shortness of Breath, palpitations, orthopnea or DOE No melena or hematochezia, no dark tarry stools, Bowel movements are regular, No dysuria, hematuria or flank pain No new skin rashes, lesions, masses or bruises, No new joints pains-aches No recent weight gain or loss No polyuria, polydypsia or  polyphagia,  *A full 10 point Review of Systems was done, except as stated above, all other Review of Systems were negative.  Social History History  Substance Use Topics  . Smoking status: Former Research scientist (life sciences)  . Smokeless tobacco: Not on file  . Alcohol Use: Yes     Comment: occasional beer    Resides at: Private residence  Lives with: Wife  Ambulatory status: Ambulatory prior to admission without assistive devices   Family History Family History  Problem Relation Age of Onset  . Heart disease Father   . Heart attack Father   . Hypertension Brother   . Cancer Maternal Grandfather   . Hypertension Brother      Prior to Admission medications   Medication Sig Start Date End Date Taking? Authorizing Provider  acetaminophen (TYLENOL) 650 MG CR tablet Take 650 mg by mouth every 8 (eight) hours as needed for pain.    Yes Historical Provider, MD  amLODipine (NORVASC) 5 MG tablet Take 5 mg by mouth daily.   Yes Historical Provider, MD  aspirin 81 MG tablet Take 81 mg by mouth daily.   Yes Historical Provider, MD  atorvastatin (LIPITOR) 40 MG tablet Take 40 mg by mouth at bedtime.    Yes Historical Provider, MD  calcium carbonate (OS-CAL) 600 MG TABS tablet Take 600 mg by mouth daily with breakfast.   Yes Historical Provider, MD  cetirizine (ZYRTEC) 10 MG tablet Take 10 mg by mouth daily.   Yes Historical Provider, MD  Cholecalciferol (VITAMIN D-3) 1000 UNITS CAPS Take 1 capsule by mouth daily.   Yes Historical Provider, MD  fluticasone (FLONASE) 50 MCG/ACT nasal spray Place 2 sprays into both nostrils as needed for allergies or rhinitis.    Yes Historical Provider, MD  glimepiride (AMARYL) 2 MG tablet Take 2 mg by mouth daily.    Yes Historical Provider, MD  losartan-hydrochlorothiazide (HYZAAR) 100-25 MG per tablet Take 1 tablet by mouth daily.     Yes Historical Provider, MD  meloxicam (MOBIC) 7.5 MG tablet Take 7.5 mg by mouth daily.   Yes Historical Provider, MD  metFORMIN  (GLUCOPHAGE) 1000 MG tablet Take 1,000 mg by mouth 2 (two) times daily with a meal.     Yes Historical Provider, MD  Multiple Vitamin (MULTIVITAMIN WITH MINERALS) TABS tablet Take 1 tablet by mouth daily.   Yes Historical Provider, MD  neomycin-bacitracin-polymyxin (NEOSPORIN) ointment Apply 1 application topically as needed for wound care. apply to eye   Yes Historical Provider, MD    Allergies  Allergen Reactions  . Oxycodone-Acetaminophen  Other (See Comments)    Passes out     Physical Exam  Vitals  Blood pressure 128/74, pulse 101, temperature 97.4 F (36.3 C), temperature source Oral, resp. rate 14, height 5' 7"  (1.702 m), weight 185 lb (83.915 kg), SpO2 95 %.   General:  In no acute distress, appears healthy and well nourished  Psych:  Normal affect, Denies Suicidal or Homicidal ideations, Awake Alert, Oriented X 3. Speech and thought patterns are clear and appropriate, no apparent short term memory deficits  Neuro:   No focal neurological deficits, CN II through XII intact, Strength 5/5 all 4 extremities, Sensation intact all 4 extremities.  ENT:  Ears and Eyes appear Normal, Conjunctivae clear, PER. Moist oral mucosa without erythema or exudates.  Neck:  Supple, No lymphadenopathy appreciated  Respiratory:  Symmetrical chest wall movement, Good air movement bilaterally, CTAB. Room Air  Cardiac:  RRR, No Murmurs, no LE edema noted, no JVD, No carotid bruits, peripheral pulses palpable at 2+  Abdomen: Diminished bowel sounds, ostomy intact, somewhat distended and noted tenderness with guarding and rebounding over right upper quadrant radiating to the right lateral abdomen,  No masses appreciated, no obvious hepatosplenomegaly  Skin:  No Cyanosis, Normal Skin Turgor, No Skin Rash or Bruise.  Extremities: Symmetrical without obvious trauma or injury,  no effusions.  Data Review  CBC  Recent Labs Lab 09/03/14 0508  WBC 10.7*  HGB 13.6  HCT 38.9*  PLT 282  MCV  86.4  MCH 30.2  MCHC 35.0  RDW 11.9  LYMPHSABS 1.0  MONOABS 1.0  EOSABS 0.1  BASOSABS 0.0    Chemistries   Recent Labs Lab 09/03/14 0508  NA 136  K 3.7  CL 98*  CO2 26  GLUCOSE 224*  BUN 23*  CREATININE 1.04  CALCIUM 9.5  AST 217*  ALT 130*  ALKPHOS 144*  BILITOT 1.3*    estimated creatinine clearance is 67.5 mL/min (by C-G formula based on Cr of 1.04).  No results for input(s): TSH, T4TOTAL, T3FREE, THYROIDAB in the last 72 hours.  Invalid input(s): FREET3  Coagulation profile No results for input(s): INR, PROTIME in the last 168 hours.  No results for input(s): DDIMER in the last 72 hours.  Cardiac Enzymes No results for input(s): CKMB, TROPONINI, MYOGLOBIN in the last 168 hours.  Invalid input(s): CK  Invalid input(s): POCBNP  Urinalysis    Component Value Date/Time   COLORURINE YELLOW 09/03/2014 0650   APPEARANCEUR CLEAR 09/03/2014 0650   LABSPEC 1.026 09/03/2014 0650   PHURINE 5.0 09/03/2014 0650   GLUCOSEU 100* 09/03/2014 0650   HGBUR NEGATIVE 09/03/2014 0650   BILIRUBINUR NEGATIVE 09/03/2014 0650   KETONESUR 15* 09/03/2014 0650   PROTEINUR 30* 09/03/2014 0650   UROBILINOGEN 0.2 09/03/2014 0650   NITRITE NEGATIVE 09/03/2014 0650   LEUKOCYTESUR NEGATIVE 09/03/2014 0650    Imaging results:   Ct Abdomen Pelvis W Contrast  09/03/2014   CLINICAL DATA:  Right-sided abdominal pain for 1 week. History of colorectal cancer.  EXAM: CT ABDOMEN AND PELVIS WITH CONTRAST  TECHNIQUE: Multidetector CT imaging of the abdomen and pelvis was performed using the standard protocol following bolus administration of intravenous contrast.  CONTRAST:  183m OMNIPAQUE IOHEXOL 300 MG/ML  SOLN  COMPARISON:  None.  FINDINGS: There is a 3 mm nodule in the left upper lobe on sequence 3, image 13. Dependent atelectasis in the lower lobes bilaterally. No evidence for pleural effusions. Negative for free air.  There is diffuse wall thickening of the  gallbladder with surrounding  inflammation. Small calcified stones within the gallbladder. Gallbladder wall thickness measures up to 7 mm. There is minimal intrahepatic biliary dilatation. Portal venous system is patent. No significant dilatation of the extrahepatic bile duct.  Normal appearance of the pancreas, spleen and adrenal glands. Normal appearance of the right kidney. 1.6 cm low-density structure in the left kidney is suggestive for a cyst. Negative for hydronephrosis.  Calcifications in the coronary arteries.  Cardiac pacemaker leads.  There is irregular soft tissue with calcifications in the presacral space. Suspect this is related to treatment changes from colon cancer. Soft tissue in this area measures 3.1 cm in the AP dimension. Small amount of fluid in the urinary bladder. The rectum appears to be surgically absent. Patient has a descending colostomy. There is some high-density material in the appendix but no evidence for appendix enlargement or inflammation. Normal appearance of the small bowel.  No acute bone abnormality. 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  IMPRESSION: Cholelithiasis with diffuse gallbladder wall thickening and surrounding inflammatory changes. Findings are suggestive for acute cholecystitis.  Postsurgical changes related to colon cancer with a descending colostomy. Soft tissue thickening with calcifications in the presacral space is compatible with post treatment changes.  Indeterminate 3 mm nodule in the left upper lobe. If the patient is at high risk for bronchogenic carcinoma, follow-up chest CT at 1 year is recommended. If the patient is at low risk, no follow-up is needed. This recommendation follows the consensus statement: Guidelines for Management of Small Pulmonary Nodules Detected on CT Scans: A Statement from the [employer] as published in Radiology 2005; 237:395-400.   Electronically Signed   By: Markus Daft M.D.   On: 09/03/2014 09:05   Dg Chest Port 1  View  09/03/2014   CLINICAL DATA:  Chest pain and generalized abdominal pain since 08/24/2014.  EXAM: PORTABLE CHEST - 1 VIEW  COMPARISON:  07/04/2013  FINDINGS: There is a shallow inspiration with mild associated crowding of the basilar markings. There is no confluent airspace consolidation. There is no large effusion.  There are intact appearances of the dual-chamber transvenous leads. Hilar, mediastinal and cardiac contours are unremarkable and unchanged  IMPRESSION: No acute cardiopulmonary findings.  Shallow inspiration.   Electronically Signed   By: Andreas Newport M.D.   On: 09/03/2014 05:44     EKG: (Independently reviewed) sinus rhythm without acute ischemic changes   Assessment & Plan  Principal Problem:   Acute cholecystitis/acute mild biliary pancreatitis -Admit to surgical floor -Await formal general surgery consultation but anticipate surgical intervention later today -Nothing by mouth -Supportive care with IV fluids, pain medications and antiemetic medications -Empiric antibiotics to cover gram negatives; Zosyn given in ER and will continue -Symptoms ongoing less than 1 week and only marginal elevation in white count so no clinical findings suggestive of bacteremia or ascending cholangitis -Repeat CBC, lipase in a.m. as well as LFTs -Total bilirubin only marginally elevated so do not suspect obstructive choledocolithiasis at this juncture [facility] Safe Perioperative Risk Assessment is 0.14% for perioperative MI or arrest, Postoperative Respiratory Failure Risk calculated slightly higher at 0.75% but this was based on initial presentation with complicating factor of SIRS (elevated lactic acid) which is now resolved  Active Problems:   Metabolic acidosis/Mild dehydration -Secondary to acute cholecystitis and marginal oral intake prior to admission -Holding metformin in setting of recent contrasted CT; we'll also hold in setting of recent acidosis which has resolved since  presentation  HYPERTENSION, BENIGN -Continue Cozaar but will hold diuretic    Diabetes mellitus type 2, controlled -Since nothing by mouth we'll hold Amaryl; also holding metformin as above -We'll utilize dextrose containing IV fluids initially while nothing by mouth and while waiting for oral hypoglycemic agents to wash out -Check CBGs every 4 hours and provide sliding scale insulin    Chronic diastolic heart failure, NYHA class 1 -Compensated at this juncture -Keep blood pressure controlled    Sinus node dysfunction/Pacemaker-St. Jude -Currently maintaining sinus rhythm    Nodule of left lung-19m -Does not smoke and has never smoked -Defer to primary care physician regarding additional radiographic follow-up    DVT Prophylaxis: Subcutaneous heparin  Family Communication: Wife at bedside    Code Status:  Full code  Condition:  Stable  Discharge disposition: Anticipate discharge home with wife, duration of hospital stay dependent on surgical procedure and subsequent postop recovery  Time spent in minutes : 60   ELLIS,ALLISON L. ANP on 09/03/2014 at 10:24 AM  Between 7am to 7pm - Pager - [phone]  After 7pm go to [email or web address] - password [email or web address]  And look for the night coverage person covering me after hours  [facility]

## 2014-09-03 NOTE — Progress Notes (Signed)
UR COMPLETED  

## 2014-09-04 ENCOUNTER — Inpatient Hospital Stay (HOSPITAL_COMMUNITY): Payer: Medicare Other | Admitting: Anesthesiology

## 2014-09-04 ENCOUNTER — Encounter (HOSPITAL_COMMUNITY)
Admission: EM | Disposition: A | Discharge status: Home / Self Care | Payer: Self-pay | Source: Home / Self Care | Attending: Internal Medicine

## 2014-09-04 ENCOUNTER — Inpatient Hospital Stay (HOSPITAL_COMMUNITY): Payer: Medicare Other

## 2014-09-04 HISTORY — PX: CHOLECYSTECTOMY: SHX55

## 2014-09-04 LAB — CBC
HCT: 31.8 % — ABNORMAL LOW (ref 39.0–52.0)
Hemoglobin: 10.8 g/dL — ABNORMAL LOW (ref 13.0–17.0)
MCH: 29.7 pg (ref 26.0–34.0)
MCHC: 34 g/dL (ref 30.0–36.0)
MCV: 87.4 fL (ref 78.0–100.0)
Platelets: 230 10*3/uL (ref 150–400)
RBC: 3.64 MIL/uL — ABNORMAL LOW (ref 4.22–5.81)
RDW: 11.9 % (ref 11.5–15.5)
WBC: 4.8 10*3/uL (ref 4.0–10.5)

## 2014-09-04 LAB — COMPREHENSIVE METABOLIC PANEL
ALT: 348 U/L — ABNORMAL HIGH (ref 17–63)
AST: 295 U/L — ABNORMAL HIGH (ref 15–41)
Albumin: 2.7 g/dL — ABNORMAL LOW (ref 3.5–5.0)
Alkaline Phosphatase: 203 U/L — ABNORMAL HIGH (ref 38–126)
Anion gap: 10 (ref 5–15)
BUN: 17 mg/dL (ref 6–20)
CO2: 26 mmol/L (ref 22–32)
Calcium: 8.5 mg/dL — ABNORMAL LOW (ref 8.9–10.3)
Chloride: 102 mmol/L (ref 101–111)
Creatinine, Ser: 1.18 mg/dL (ref 0.61–1.24)
GFR calc Af Amer: >60 mL/min (ref >60)
GFR calc non Af Amer: >60 mL/min (ref >60)
Glucose, Bld: 194 mg/dL — ABNORMAL HIGH (ref 70–99)
Potassium: 4.6 mmol/L (ref 3.5–5.1)
Sodium: 138 mmol/L (ref 135–145)
Total Bilirubin: 3.8 mg/dL — ABNORMAL HIGH (ref 0.3–1.2)
Total Protein: 6.1 g/dL — ABNORMAL LOW (ref 6.5–8.1)

## 2014-09-04 LAB — GLUCOSE, CAPILLARY
Glucose-Capillary: 128 mg/dL — ABNORMAL HIGH (ref 70–99)
Glucose-Capillary: 129 mg/dL — ABNORMAL HIGH (ref 70–99)
Glucose-Capillary: 137 mg/dL — ABNORMAL HIGH (ref 70–99)
Glucose-Capillary: 146 mg/dL — ABNORMAL HIGH (ref 70–99)
Glucose-Capillary: 150 mg/dL — ABNORMAL HIGH (ref 70–99)
Glucose-Capillary: 153 mg/dL — ABNORMAL HIGH (ref 70–99)
Glucose-Capillary: 163 mg/dL — ABNORMAL HIGH (ref 70–99)
Glucose-Capillary: 182 mg/dL — ABNORMAL HIGH (ref 70–99)

## 2014-09-04 LAB — LIPASE, BLOOD: Lipase: 67 U/L — ABNORMAL HIGH (ref 22–51)

## 2014-09-04 SURGERY — LAPAROSCOPIC CHOLECYSTECTOMY WITH INTRAOPERATIVE CHOLANGIOGRAM
Anesthesia: General | Site: Abdomen

## 2014-09-04 MED ORDER — PIPERACILLIN-TAZOBACTAM 3.375 G IVPB
3.3750 g | Freq: Three times a day (TID) | INTRAVENOUS | Status: DC
  Administered 2014-09-04 – 2014-09-06 (×5): 3.375 g via INTRAVENOUS
  Filled 2014-09-04 (×7): qty 50

## 2014-09-04 MED ORDER — MIDAZOLAM HCL 5 MG/5ML IJ SOLN
INTRAMUSCULAR | Status: DC | PRN
  Administered 2014-09-04: 2 mg via INTRAVENOUS

## 2014-09-04 MED ORDER — HYDROMORPHONE HCL 1 MG/ML IJ SOLN
0.2500 mg | INTRAMUSCULAR | Status: DC | PRN
  Administered 2014-09-04 (×2): 0.5 mg via INTRAVENOUS

## 2014-09-04 MED ORDER — FENTANYL CITRATE (PF) 100 MCG/2ML IJ SOLN
INTRAMUSCULAR | Status: DC | PRN
  Administered 2014-09-04: 100 ug via INTRAVENOUS
  Administered 2014-09-04 (×3): 50 ug via INTRAVENOUS

## 2014-09-04 MED ORDER — MORPHINE SULFATE 2 MG/ML IJ SOLN
2.0000 mg | INTRAMUSCULAR | Status: DC | PRN
  Administered 2014-09-04: 2 mg via INTRAVENOUS
  Administered 2014-09-04 – 2014-09-05 (×2): 4 mg via INTRAVENOUS
  Administered 2014-09-05 (×3): 2 mg via INTRAVENOUS
  Administered 2014-09-05 – 2014-09-06 (×3): 4 mg via INTRAVENOUS
  Filled 2014-09-04 (×3): qty 2
  Filled 2014-09-04 (×6): qty 1
  Filled 2014-09-04: qty 2

## 2014-09-04 MED ORDER — 0.9 % SODIUM CHLORIDE (POUR BTL) OPTIME
TOPICAL | Status: DC | PRN
  Administered 2014-09-04: 1000 mL

## 2014-09-04 MED ORDER — LIDOCAINE HCL 4 % MT SOLN
OROMUCOSAL | Status: DC | PRN
  Administered 2014-09-04: 4 mL via TOPICAL

## 2014-09-04 MED ORDER — ONDANSETRON HCL 4 MG/2ML IJ SOLN
INTRAMUSCULAR | Status: DC | PRN
  Administered 2014-09-04: 4 mg via INTRAVENOUS

## 2014-09-04 MED ORDER — LIDOCAINE HCL (CARDIAC) 20 MG/ML IV SOLN
INTRAVENOUS | Status: DC | PRN
  Administered 2014-09-04: 40 mg via INTRAVENOUS

## 2014-09-04 MED ORDER — SODIUM CHLORIDE 0.9 % IV SOLN
INTRAVENOUS | Status: DC | PRN
  Administered 2014-09-04: 11:00:00

## 2014-09-04 MED ORDER — GLYCOPYRROLATE 0.2 MG/ML IJ SOLN
INTRAMUSCULAR | Status: DC | PRN
  Administered 2014-09-04: 0.6 mg via INTRAVENOUS

## 2014-09-04 MED ORDER — HYDROMORPHONE HCL 1 MG/ML IJ SOLN
INTRAMUSCULAR | Status: AC
  Filled 2014-09-04: qty 1

## 2014-09-04 MED ORDER — BUPIVACAINE-EPINEPHRINE 0.25% -1:200000 IJ SOLN
INTRAMUSCULAR | Status: DC | PRN
  Administered 2014-09-04: 30 mL

## 2014-09-04 MED ORDER — MIDAZOLAM HCL 2 MG/2ML IJ SOLN
INTRAMUSCULAR | Status: AC
  Filled 2014-09-04: qty 2

## 2014-09-04 MED ORDER — LACTATED RINGERS IV SOLN
INTRAVENOUS | Status: DC | PRN
  Administered 2014-09-04: 10:00:00 via INTRAVENOUS

## 2014-09-04 MED ORDER — PROPOFOL 10 MG/ML IV BOLUS
INTRAVENOUS | Status: DC | PRN
  Administered 2014-09-04: 20 mg via INTRAVENOUS
  Administered 2014-09-04: 120 mg via INTRAVENOUS
  Administered 2014-09-04: 20 mg via INTRAVENOUS

## 2014-09-04 MED ORDER — NEOSTIGMINE METHYLSULFATE 10 MG/10ML IV SOLN
INTRAVENOUS | Status: DC | PRN
  Administered 2014-09-04: 4 mg via INTRAVENOUS

## 2014-09-04 MED ORDER — ONDANSETRON HCL 4 MG/2ML IJ SOLN: 4.0000 mg | Freq: Four times a day (QID) | INTRAMUSCULAR | Status: DC | PRN

## 2014-09-04 MED ORDER — ESMOLOL HCL 10 MG/ML IV SOLN
INTRAVENOUS | Status: DC | PRN
  Administered 2014-09-04 (×5): 10 mg via INTRAVENOUS

## 2014-09-04 MED ORDER — ROCURONIUM BROMIDE 100 MG/10ML IV SOLN
INTRAVENOUS | Status: DC | PRN
  Administered 2014-09-04: 30 mg via INTRAVENOUS

## 2014-09-04 MED ORDER — FENTANYL CITRATE (PF) 250 MCG/5ML IJ SOLN
INTRAMUSCULAR | Status: AC
  Filled 2014-09-04: qty 5

## 2014-09-04 MED ORDER — BUPIVACAINE-EPINEPHRINE (PF) 0.25% -1:200000 IJ SOLN
INTRAMUSCULAR | Status: AC
  Filled 2014-09-04: qty 30

## 2014-09-04 MED ORDER — ARTIFICIAL TEARS OP OINT
TOPICAL_OINTMENT | OPHTHALMIC | Status: DC | PRN
  Administered 2014-09-04: 1 via OPHTHALMIC

## 2014-09-04 MED ORDER — SODIUM CHLORIDE 0.9 % IR SOLN
Status: DC | PRN
  Administered 2014-09-04: 1000 mL

## 2014-09-04 SURGICAL SUPPLY — 46 items
APPLIER CLIP 5 13 M/L LIGAMAX5 (MISCELLANEOUS) ×4
BLADE SURG CLIPPER 3M 9600 (MISCELLANEOUS) IMPLANT
CANISTER SUCTION 2500CC (MISCELLANEOUS) ×2 IMPLANT
CHLORAPREP W/TINT 26ML (MISCELLANEOUS) ×2 IMPLANT
CLIP APPLIE 5 13 M/L LIGAMAX5 (MISCELLANEOUS) ×2 IMPLANT
COVER MAYO STAND STRL (DRAPES) ×2 IMPLANT
COVER SURGICAL LIGHT HANDLE (MISCELLANEOUS) ×2 IMPLANT
DRAPE C-ARM 42X72 X-RAY (DRAPES) ×2 IMPLANT
DRAPE LAPAROSCOPIC ABDOMINAL (DRAPES) ×2 IMPLANT
DRAPE SURG 17X23 STRL (DRAPES) ×2 IMPLANT
ELECT REM PT RETURN 9FT ADLT (ELECTROSURGICAL) ×2
ELECTRODE REM PT RTRN 9FT ADLT (ELECTROSURGICAL) ×1 IMPLANT
GLOVE BIO SURGEON STRL SZ 6.5 (GLOVE) ×2 IMPLANT
GLOVE BIO SURGEON STRL SZ7 (GLOVE) ×4 IMPLANT
GLOVE BIOGEL PI IND STRL 6.5 (GLOVE) ×1 IMPLANT
GLOVE BIOGEL PI IND STRL 7.0 (GLOVE) ×1 IMPLANT
GLOVE BIOGEL PI IND STRL 7.5 (GLOVE) ×1 IMPLANT
GLOVE BIOGEL PI INDICATOR 6.5 (GLOVE) ×1
GLOVE BIOGEL PI INDICATOR 7.0 (GLOVE) ×1
GLOVE BIOGEL PI INDICATOR 7.5 (GLOVE) ×1
GLOVE ECLIPSE 7.5 STRL STRAW (GLOVE) ×2 IMPLANT
GLOVE SS BIOGEL STRL SZ 7 (GLOVE) ×1 IMPLANT
GLOVE SUPERSENSE BIOGEL SZ 7 (GLOVE) ×1
GLOVE SURG SIGNA 7.5 PF LTX (GLOVE) ×2 IMPLANT
GOWN STRL REUS W/ TWL LRG LVL3 (GOWN DISPOSABLE) ×2 IMPLANT
GOWN STRL REUS W/ TWL XL LVL3 (GOWN DISPOSABLE) ×1 IMPLANT
GOWN STRL REUS W/TWL LRG LVL3 (GOWN DISPOSABLE) ×2
GOWN STRL REUS W/TWL XL LVL3 (GOWN DISPOSABLE) ×1
KIT BASIN OR (CUSTOM PROCEDURE TRAY) ×2 IMPLANT
KIT ROOM TURNOVER OR (KITS) ×2 IMPLANT
LIQUID BAND (GAUZE/BANDAGES/DRESSINGS) ×2 IMPLANT
NS IRRIG 1000ML POUR BTL (IV SOLUTION) ×2 IMPLANT
PAD ARMBOARD 7.5X6 YLW CONV (MISCELLANEOUS) ×2 IMPLANT
POUCH SPECIMEN RETRIEVAL 10MM (ENDOMECHANICALS) ×2 IMPLANT
SCISSORS LAP 5X35 DISP (ENDOMECHANICALS) ×2 IMPLANT
SET CHOLANGIOGRAPH 5 50 .035 (SET/KITS/TRAYS/PACK) ×2 IMPLANT
SET IRRIG TUBING LAPAROSCOPIC (IRRIGATION / IRRIGATOR) ×2 IMPLANT
SLEEVE ENDOPATH XCEL 5M (ENDOMECHANICALS) ×4 IMPLANT
SPECIMEN JAR SMALL (MISCELLANEOUS) ×2 IMPLANT
SUT MON AB 4-0 PC3 18 (SUTURE) ×2 IMPLANT
TOWEL OR 17X24 6PK STRL BLUE (TOWEL DISPOSABLE) ×2 IMPLANT
TOWEL OR 17X26 10 PK STRL BLUE (TOWEL DISPOSABLE) ×2 IMPLANT
TRAY LAPAROSCOPIC (CUSTOM PROCEDURE TRAY) ×2 IMPLANT
TROCAR XCEL BLUNT TIP 100MML (ENDOMECHANICALS) ×2 IMPLANT
TROCAR XCEL NON-BLD 5MMX100MML (ENDOMECHANICALS) ×2 IMPLANT
TUBING INSUFFLATION (TUBING) ×2 IMPLANT

## 2014-09-04 NOTE — Anesthesia Procedure Notes (Signed)
Procedure Name: Intubation Date/Time: 09/04/2014 10:25 AM Performed by: Suzy Bouchard Pre-anesthesia Checklist: Patient identified, Emergency Drugs available, Patient being monitored, Timeout performed and Suction available Patient Re-evaluated:Patient Re-evaluated prior to inductionOxygen Delivery Method: Circle system utilized Preoxygenation: Pre-oxygenation with 100% oxygen Intubation Type: IV induction Ventilation: Mask ventilation without difficulty Laryngoscope Size: Miller and 2 Grade View: Grade II Tube type: Oral Tube size: 7.0 mm Number of attempts: 1 Airway Equipment and Method: Stylet and LTA kit utilized Placement Confirmation: ETT inserted through vocal cords under direct vision,  breath sounds checked- equal and bilateral and positive ETCO2 Secured at: 21 cm Tube secured with: Tape Dental Injury: Teeth and Oropharynx as per pre-operative assessment

## 2014-09-04 NOTE — Progress Notes (Signed)
[facility] PROGRESS NOTE  Joshua Ali NIO:[id number] DOB: 18-May-1940 DOA: 09/03/2014 PCP: Olin Hauser, MD Interim summary: 74 year old male patient with past medical history sick sinus syndrome with subsequent pacemaker, history of colon cancer with colostomy after colon resection, diabetes mellitus on oral agents, hypertension and chronic grade 1 diastolic heart failure admitted for diffuse abdominal pain for 4 days.   Assessment/Plan: 1. Acute cholecystitis with cholelithiasis: Admitted to med surg and started broad spectrum antibiotics and IV fluids. He underwent laparoscopic cholecystectomy with intra operative cholangiogram by surgery.  Pain control  And Gi consulted for possible ERCP.  REPEAT CMP in am.    2. Dehdyration: With IV fluids.    Hypertension: Controlled.    Anemia: Monitor.    Diabetes mellitus: CBG (last 3)   Recent Labs  09/04/14 0957 09/04/14 1152 09/04/14 1300  GLUCAP 146* 137* 129*    Resume SSI.     Code Status: full code.  Family Communication: none at bedside Disposition Plan: pending.    Consultants:  Surgery  gi  Procedures:  Lap cholecystectomy on 5/3  Antibiotics:  Zosyn 5/3  HPI/Subjective: Pain better controlled.   Objective: Filed Vitals:   09/04/14 1312  BP: 126/71  Pulse: 110  Temp: 99 F (37.2 C)  Resp: 15    Intake/Output Summary (Last 24 hours) at 09/04/14 1518 Last data filed at 09/04/14 1237  Gross per 24 hour  Intake 3264.58 ml  Output      0 ml  Net 3264.58 ml   Filed Weights   09/03/14 0443  Weight: 83.915 kg (185 lb)    Exam:   General:  Alert afebrile comfortable  Cardiovascular: s1s2  Respiratory: clear to auscultation, no wheezing or rhonchi.   Abdomen: soft , generalized tenderness. Bowel sounds heard  Musculoskeletal: trace pedal edema.   Data Reviewed: Basic Metabolic Panel:  Recent Labs Lab 09/03/14 0508 09/04/14 0500  NA 136 138  K 3.7 4.6  CL 98*  102  CO2 26 26  GLUCOSE 224* 194*  BUN 23* 17  CREATININE 1.04 1.18  CALCIUM 9.5 8.5*   Liver Function Tests:  Recent Labs Lab 09/03/14 0508 09/04/14 0500  AST 217* 295*  ALT 130* 348*  ALKPHOS 144* 203*  BILITOT 1.3* 3.8*  PROT 7.2 6.1*  ALBUMIN 3.6 2.7*    Recent Labs Lab 09/03/14 0508 09/04/14 0500  LIPASE 130* 67*   No results for input(s): AMMONIA in the last 168 hours. CBC:  Recent Labs Lab 09/03/14 0508 09/04/14 0500  WBC 10.7* 4.8  NEUTROABS 8.6*  --   HGB 13.6 10.8*  HCT 38.9* 31.8*  MCV 86.4 87.4  PLT 282 230   Cardiac Enzymes: No results for input(s): CKTOTAL, CKMB, CKMBINDEX, TROPONINI in the last 168 hours. BNP (last 3 results) No results for input(s): BNP in the last 8760 hours.  ProBNP (last 3 results) No results for input(s): PROBNP in the last 8760 hours.  CBG:  Recent Labs Lab 09/04/14 0342 09/04/14 0736 09/04/14 0957 09/04/14 1152 09/04/14 1300  GLUCAP 163* 153* 146* 137* 129*    Recent Results (from the past 240 hour(s))  Surgical pcr screen     Status: None   Collection Time: 09/03/14  9:18 PM  Result Value Ref Range Status   MRSA, PCR NEGATIVE NEGATIVE Final   Staphylococcus aureus NEGATIVE NEGATIVE Final    Comment:        The Xpert SA Assay (FDA approved for NASAL specimens in patients over 21 years of  age), is one component of a comprehensive surveillance program.  Test performance has been validated by [facility] for patients greater than or equal to 28 year old. It is not intended to diagnose infection nor to guide or monitor treatment.      Studies: Dg Cholangiogram Operative  09/04/2014   CLINICAL DATA:  Cholecystectomy.  Gallstones.  EXAM: INTRAOPERATIVE CHOLANGIOGRAM  TECHNIQUE: Cholangiographic images from the C-arm fluoroscopic device were submitted for interpretation post-operatively. Please see the procedural report for the amount of contrast and the fluoroscopy time utilized.  COMPARISON:  None.   FINDINGS: There is an irregular oblong filling defect in the distal common bile duct, potentially resulting in impedance of flow of bile into the intestine. Contrast does fill the biliary tree and duodenum compatible with patency. However, there is reflux of contrast into the pancreatic duct supporting potential partial obstruction.  IMPRESSION: Patent biliary tree.  Findings are worrisome for a distal common bile duct stone or debris resulting in partial obstruction.   Electronically Signed   By: Marybelle Killings M.D.   On: 09/04/2014 11:44   Ct Abdomen Pelvis W Contrast  09/03/2014   CLINICAL DATA:  Right-sided abdominal pain for 1 week. History of colorectal cancer.  EXAM: CT ABDOMEN AND PELVIS WITH CONTRAST  TECHNIQUE: Multidetector CT imaging of the abdomen and pelvis was performed using the standard protocol following bolus administration of intravenous contrast.  CONTRAST:  134mL OMNIPAQUE IOHEXOL 300 MG/ML  SOLN  COMPARISON:  None.  FINDINGS: There is a 3 mm nodule in the left upper lobe on sequence 3, image 13. Dependent atelectasis in the lower lobes bilaterally. No evidence for pleural effusions. Negative for free air.  There is diffuse wall thickening of the gallbladder with surrounding inflammation. Small calcified stones within the gallbladder. Gallbladder wall thickness measures up to 7 mm. There is minimal intrahepatic biliary dilatation. Portal venous system is patent. No significant dilatation of the extrahepatic bile duct.  Normal appearance of the pancreas, spleen and adrenal glands. Normal appearance of the right kidney. 1.6 cm low-density structure in the left kidney is suggestive for a cyst. Negative for hydronephrosis.  Calcifications in the coronary arteries.  Cardiac pacemaker leads.  There is irregular soft tissue with calcifications in the presacral space. Suspect this is related to treatment changes from colon cancer. Soft tissue in this area measures 3.1 cm in the AP dimension. Small  amount of fluid in the urinary bladder. The rectum appears to be surgically absent. Patient has a descending colostomy. There is some high-density material in the appendix but no evidence for appendix enlargement or inflammation. Normal appearance of the small bowel.  No acute bone abnormality. 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  IMPRESSION: Cholelithiasis with diffuse gallbladder wall thickening and surrounding inflammatory changes. Findings are suggestive for acute cholecystitis.  Postsurgical changes related to colon cancer with a descending colostomy. Soft tissue thickening with calcifications in the presacral space is compatible with post treatment changes.  Indeterminate 3 mm nodule in the left upper lobe. If the patient is at high risk for bronchogenic carcinoma, follow-up chest CT at 1 year is recommended. If the patient is at low risk, no follow-up is needed. This recommendation follows the consensus statement: Guidelines for Management of Small Pulmonary Nodules Detected on CT Scans: A Statement from the [place] as published in Radiology 2005; 237:395-400.   Electronically Signed   By: Markus Daft M.D.   On: 09/03/2014 09:05   Dg Chest  Port 1 View  09/03/2014   CLINICAL DATA:  Chest pain and generalized abdominal pain since 08/24/2014.  EXAM: PORTABLE CHEST - 1 VIEW  COMPARISON:  07/04/2013  FINDINGS: There is a shallow inspiration with mild associated crowding of the basilar markings. There is no confluent airspace consolidation. There is no large effusion.  There are intact appearances of the dual-chamber transvenous leads. Hilar, mediastinal and cardiac contours are unremarkable and unchanged  IMPRESSION: No acute cardiopulmonary findings.  Shallow inspiration.   Electronically Signed   By: Andreas Newport M.D.   On: 09/03/2014 05:44    Scheduled Meds: . amLODipine  5 mg Oral Daily  . folic acid  1 mg Intravenous Daily  . heparin  5,000 Units  Subcutaneous 3 times per day  . HYDROmorphone      . insulin aspart  0-15 Units Subcutaneous 6 times per day  . losartan  100 mg Oral Daily  . piperacillin-tazobactam (ZOSYN)  IV  3.375 g Intravenous 3 times per day  . thiamine  100 mg Intravenous Daily   Continuous Infusions: . dextrose 5 % and 0.9 % NaCl with KCl 20 mEq/L 125 mL/hr at 09/04/14 6387    Principal Problem:   Acute cholecystitis Active Problems:   Sinus node dysfunction   HYPERTENSION, BENIGN   Pacemaker-[employer]   Diabetes mellitus type 2, controlled   Metabolic acidosis   Mild dehydration   Chronic diastolic heart failure, NYHA class 1   Acute biliary pancreatitis   Nodule of left lung-16mm    Time spent: 25 minutes.     [facility] Pager [phone] If 7PM-7AM, please contact night-coverage at [email or web address], password [facility] 09/04/2014, 3:18 PM  LOS: 1 day

## 2014-09-04 NOTE — Anesthesia Preprocedure Evaluation (Addendum)
Anesthesia Evaluation  Patient identified by MRN, date of birth, ID band Patient awake    Reviewed: Allergy & Precautions, NPO status , Patient's Chart, lab work & pertinent test results  Airway Mallampati: II  TM Distance: <3 FB Neck ROM: Full    Dental  (+) Teeth Intact, Dental Advisory Given   Pulmonary former smoker,    Pulmonary exam normal       Cardiovascular hypertension, + pacemaker Rhythm:regular Rate:Normal     Neuro/Psych    GI/Hepatic   Endo/Other  diabetes, Type 2  Renal/GU      Musculoskeletal  (+) Arthritis -,   Abdominal   Peds  Hematology   Anesthesia Other Findings   Reproductive/Obstetrics                            Anesthesia Physical Anesthesia Plan  ASA: II  Anesthesia Plan: General   Post-op Pain Management:    Induction: Intravenous  Airway Management Planned: Oral ETT  Additional Equipment:   Intra-op Plan:   Post-operative Plan: Extubation in OR  Informed Consent: I have reviewed the patients History and Physical, chart, labs and discussed the procedure including the risks, benefits and alternatives for the proposed anesthesia with the patient or authorized representative who has indicated his/her understanding and acceptance.     Plan Discussed with: CRNA, Anesthesiologist and Surgeon  Anesthesia Plan Comments:         Anesthesia Quick Evaluation

## 2014-09-04 NOTE — Op Note (Signed)
LAPAROSCOPIC CHOLECYSTECTOMY WITH INTRAOPERATIVE CHOLANGIOGRAM  Procedure Note  Joshua Ali 09/03/2014 - 09/04/2014   Pre-op Diagnosis: Acute Cholecystitis with cholelithiasis     Post-op Diagnosis: same with choledolcholithiasis  Procedure(s): LAPAROSCOPIC CHOLECYSTECTOMY WITH INTRAOPERATIVE CHOLANGIOGRAM  Surgeon(s): Coralie Keens, MD  Anesthesia: General  Staff:  Circulator: Gilberto Better, RN Radiology Technologist: Tyson Dense Scrub Person: Milas Kocher, RN Circulator Assistant: Cyd Silence, RN RN First Assistant: Cyd Silence, RN  Estimated Blood Loss: Minimal               c-gram shows possible CBD distal stone          [name]   Date: 09/04/2014  Time: 11:25 AM

## 2014-09-04 NOTE — Progress Notes (Signed)
Patient ID: Joshua Ali, male   DOB: 11/10/1940, 74 y.o.   MRN: [id number]  Patient's LFT's are up, lipase is down.  He has acute cholecystitis.  He can not have an MRCP because of his pacemaker.  Discussed preop ERCP vs proceeding with a Lap chole and IOC and post op ERCP if a stone is present.  He wishes to proceed with surgery.  I discussed the risks which include but are not limited to bleeding, infection, injury to surrounding structures including the bile duct, need for a conversion to an open procedure, DVT, cardiopulmonary problems, etc.  He agrees to proceed.

## 2014-09-04 NOTE — Transfer of Care (Signed)
Immediate Anesthesia Transfer of Care Note  Patient: Joshua Ali  Procedure(s) Performed: Procedure(s): LAPAROSCOPIC CHOLECYSTECTOMY WITH INTRAOPERATIVE CHOLANGIOGRAM (N/A)  Patient Location: PACU  Anesthesia Type:General  Level of Consciousness: awake and alert   Airway & Oxygen Therapy: Patient Spontanous Breathing and Patient connected to nasal cannula oxygen  Post-op Assessment: Report given to RN, Post -op Vital signs reviewed and stable and Patient moving all extremities  Post vital signs: Reviewed and stable  Last Vitals:  Filed Vitals:   09/04/14 0524  BP: 129/66  Pulse: 90  Temp: 37.1 C  Resp: 16    Complications: No apparent anesthesia complications
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
## 2014-09-04 NOTE — Progress Notes (Signed)
Pt returned to 6N27 from PACU via stretcher.  Pt AAO X4.  Pt on 2L O2.  Pt has abd lap sites X5.  Pt has 20G to Rt hand.  SCDs in place.  Report rcvd from Audrea Muscat.  Family to bedside. Will continue to monitor.  Glade Nurse , RN

## 2014-09-04 NOTE — Progress Notes (Signed)
Attempted to call report to OR holding area. RN told nurse would call her back. Name and number provided. Will await return call.

## 2014-09-04 NOTE — Progress Notes (Signed)
Report given to Linkoln Alkire rn as caregiver 

## 2014-09-04 NOTE — Care Management Note (Signed)
Case Management Note  Patient Details  Name: Joshua Ali MRN: [id number] Date of Birth: [birth year]  Subjective/Objective:                    Action/Plan:   Expected Discharge Date:  09/07/14               Expected Discharge Plan:     In-House Referral:     Discharge planning Services     Post Acute Care Choice:    Choice offered to:     DME Arranged:    DME Agency:     HH Arranged:    [place] Agency:     Status of Service:     Medicare Important Message Given:    Date Medicare IM Given:  09/04/14 Medicare IM give by:  Magdalen Spatz RN BSN  Date Additional Medicare IM Given:    Additional Medicare Important Message give by:     If discussed at [place] of Stay Meetings, dates discussed:    Additional Comments:  Marilu Favre, RN 09/04/2014, 8:19 AM

## 2014-09-05 ENCOUNTER — Inpatient Hospital Stay (HOSPITAL_COMMUNITY): Payer: Medicare Other | Admitting: Certified Registered Nurse Anesthetist

## 2014-09-05 ENCOUNTER — Encounter (HOSPITAL_COMMUNITY): Payer: Self-pay | Admitting: Surgery

## 2014-09-05 ENCOUNTER — Encounter (HOSPITAL_COMMUNITY)
Admission: EM | Disposition: A | Discharge status: Home / Self Care | Payer: Self-pay | Source: Home / Self Care | Attending: Internal Medicine

## 2014-09-05 ENCOUNTER — Inpatient Hospital Stay (HOSPITAL_COMMUNITY): Payer: Medicare Other

## 2014-09-05 DIAGNOSIS — I10 Essential (primary) hypertension: Secondary | ICD-10-CM

## 2014-09-05 HISTORY — PX: ERCP: SHX5425

## 2014-09-05 LAB — COMPREHENSIVE METABOLIC PANEL
ALT: 276 U/L — ABNORMAL HIGH (ref 17–63)
AST: 165 U/L — ABNORMAL HIGH (ref 15–41)
Albumin: 2.5 g/dL — ABNORMAL LOW (ref 3.5–5.0)
Alkaline Phosphatase: 261 U/L — ABNORMAL HIGH (ref 38–126)
Anion gap: 10 (ref 5–15)
BUN: 10 mg/dL (ref 6–20)
CO2: 25 mmol/L (ref 22–32)
Calcium: 8.2 mg/dL — ABNORMAL LOW (ref 8.9–10.3)
Chloride: 102 mmol/L (ref 101–111)
Creatinine, Ser: 1.11 mg/dL (ref 0.61–1.24)
GFR calc Af Amer: >60 mL/min (ref >60)
GFR calc non Af Amer: >60 mL/min (ref >60)
Glucose, Bld: 170 mg/dL — ABNORMAL HIGH (ref 70–99)
Potassium: 3.8 mmol/L (ref 3.5–5.1)
Sodium: 137 mmol/L (ref 135–145)
Total Bilirubin: 4.8 mg/dL — ABNORMAL HIGH (ref 0.3–1.2)
Total Protein: 5.6 g/dL — ABNORMAL LOW (ref 6.5–8.1)

## 2014-09-05 LAB — CBC WITH DIFFERENTIAL/PLATELET
Basophils Absolute: 0 10*3/uL (ref 0.0–0.1)
Basophils Relative: 0 % (ref 0–1)
Eosinophils Absolute: 0.2 10*3/uL (ref 0.0–0.7)
Eosinophils Relative: 2 % (ref 0–5)
HCT: 33 % — ABNORMAL LOW (ref 39.0–52.0)
Hemoglobin: 11 g/dL — ABNORMAL LOW (ref 13.0–17.0)
Lymphocytes Relative: 4 % — ABNORMAL LOW (ref 12–46)
Lymphs Abs: 0.4 10*3/uL — ABNORMAL LOW (ref 0.7–4.0)
MCH: 29.5 pg (ref 26.0–34.0)
MCHC: 33.3 g/dL (ref 30.0–36.0)
MCV: 88.5 fL (ref 78.0–100.0)
Monocytes Absolute: 0.4 10*3/uL (ref 0.1–1.0)
Monocytes Relative: 4 % (ref 3–12)
Neutro Abs: 9.4 10*3/uL — ABNORMAL HIGH (ref 1.7–7.7)
Neutrophils Relative %: 90 % — ABNORMAL HIGH (ref 43–77)
Platelets: 234 10*3/uL (ref 150–400)
RBC: 3.73 MIL/uL — ABNORMAL LOW (ref 4.22–5.81)
RDW: 12 % (ref 11.5–15.5)
WBC: 10.3 10*3/uL (ref 4.0–10.5)

## 2014-09-05 LAB — GLUCOSE, CAPILLARY
Glucose-Capillary: 135 mg/dL — ABNORMAL HIGH (ref 70–99)
Glucose-Capillary: 145 mg/dL — ABNORMAL HIGH (ref 70–99)
Glucose-Capillary: 155 mg/dL — ABNORMAL HIGH (ref 70–99)
Glucose-Capillary: 165 mg/dL — ABNORMAL HIGH (ref 70–99)
Glucose-Capillary: 179 mg/dL — ABNORMAL HIGH (ref 70–99)
Glucose-Capillary: 183 mg/dL — ABNORMAL HIGH (ref 70–99)
Glucose-Capillary: 205 mg/dL — ABNORMAL HIGH (ref 70–99)

## 2014-09-05 SURGERY — ERCP, WITH INTERVENTION IF INDICATED
Anesthesia: General

## 2014-09-05 MED ORDER — LIDOCAINE HCL 4 % MT SOLN
OROMUCOSAL | Status: DC | PRN
  Administered 2014-09-05: 4 mL via TOPICAL

## 2014-09-05 MED ORDER — LIDOCAINE HCL (CARDIAC) 20 MG/ML IV SOLN
INTRAVENOUS | Status: DC | PRN
  Administered 2014-09-05: 50 mg via INTRAVENOUS

## 2014-09-05 MED ORDER — PROPOFOL 10 MG/ML IV BOLUS
INTRAVENOUS | Status: DC | PRN
  Administered 2014-09-05: 150 mg via INTRAVENOUS

## 2014-09-05 MED ORDER — SUCCINYLCHOLINE CHLORIDE 20 MG/ML IJ SOLN
INTRAMUSCULAR | Status: DC | PRN
  Administered 2014-09-05: 100 mg via INTRAVENOUS

## 2014-09-05 MED ORDER — PHENYLEPHRINE HCL 10 MG/ML IJ SOLN
INTRAMUSCULAR | Status: DC | PRN
  Administered 2014-09-05 (×2): 80 ug via INTRAVENOUS
  Administered 2014-09-05: 120 ug via INTRAVENOUS
  Administered 2014-09-05: 80 ug via INTRAVENOUS
  Administered 2014-09-05: 40 ug via INTRAVENOUS

## 2014-09-05 MED ORDER — MIDAZOLAM HCL 5 MG/5ML IJ SOLN
INTRAMUSCULAR | Status: DC | PRN
Start: 2014-09-05 — End: 2014-09-05
  Administered 2014-09-05: 2 mg via INTRAVENOUS

## 2014-09-05 MED ORDER — SODIUM CHLORIDE 0.9 % IV SOLN: INTRAVENOUS | Status: DC

## 2014-09-05 MED ORDER — FENTANYL CITRATE (PF) 100 MCG/2ML IJ SOLN
INTRAMUSCULAR | Status: DC | PRN
Start: 2014-09-05 — End: 2014-09-05
  Administered 2014-09-05: 50 ug via INTRAVENOUS

## 2014-09-05 MED ORDER — ONDANSETRON HCL 4 MG/2ML IJ SOLN
INTRAMUSCULAR | Status: DC | PRN
  Administered 2014-09-05: 4 mg via INTRAVENOUS

## 2014-09-05 MED ORDER — LACTATED RINGERS IV SOLN
INTRAVENOUS | Status: DC
  Administered 2014-09-05 (×2): via INTRAVENOUS

## 2014-09-05 MED ORDER — SODIUM CHLORIDE 0.9 % IV SOLN
INTRAVENOUS | Status: DC | PRN
  Administered 2014-09-05: 55 mL

## 2014-09-05 NOTE — Transfer of Care (Signed)
Immediate Anesthesia Transfer of Care Note  Patient: Joshua Ali  Procedure(s) Performed: Procedure(s): ENDOSCOPIC RETROGRADE CHOLANGIOPANCREATOGRAPHY (ERCP) (N/A)  Patient Location: PACU  Anesthesia Type:General  Level of Consciousness: patient cooperative and responds to stimulation  Airway & Oxygen Therapy: Patient Spontanous Breathing and Patient connected to nasal cannula oxygen  Post-op Assessment: Report given to RN and Post -op Vital signs reviewed and stable  Post vital signs: Reviewed and stable  Last Vitals:  Filed Vitals:   09/05/14 1211  BP: 130/52  Pulse: 122  Temp: 37.1 C  Resp: 16    Complications: No apparent anesthesia complications

## 2014-09-05 NOTE — Anesthesia Postprocedure Evaluation (Signed)
Anesthesia Post Note  Patient: Joshua Ali  Procedure(s) Performed: Procedure(s) (LRB): LAPAROSCOPIC CHOLECYSTECTOMY WITH INTRAOPERATIVE CHOLANGIOGRAM (N/A)  Anesthesia type: General  Patient location: PACU  Post pain: Pain level controlled and Adequate analgesia  Post assessment: Post-op Vital signs reviewed, Patient's Cardiovascular Status Stable, Respiratory Function Stable, Patent Airway and Pain level controlled  Last Vitals:  Filed Vitals:   09/05/14 1731  BP: 137/70  Pulse: 114  Temp: 36.7 C  Resp: 18    Post vital signs: Reviewed and stable  Level of consciousness: awake, alert  and oriented  Complications: No apparent anesthesia complications

## 2014-09-05 NOTE — Anesthesia Preprocedure Evaluation (Signed)
Anesthesia Evaluation  Patient identified by MRN, date of birth, ID band Patient awake    Reviewed: Allergy & Precautions, Patient's Chart, lab work & pertinent test results  Airway Mallampati: II  TM Distance: >3 FB Neck ROM: Full    Dental   Pulmonary former smoker,  breath sounds clear to auscultation        Cardiovascular hypertension, + pacemaker Rhythm:Regular Rate:Normal     Neuro/Psych    GI/Hepatic negative GI ROS, Neg liver ROS,   Endo/Other  diabetes  Renal/GU negative Renal ROS     Musculoskeletal   Abdominal   Peds  Hematology   Anesthesia Other Findings   Reproductive/Obstetrics                             Anesthesia Physical Anesthesia Plan  ASA: III  Anesthesia Plan: General   Post-op Pain Management:    Induction: Intravenous  Airway Management Planned: Oral ETT  Additional Equipment:   Intra-op Plan:   Post-operative Plan: Extubation in OR  Informed Consent: I have reviewed the patients History and Physical, chart, labs and discussed the procedure including the risks, benefits and alternatives for the proposed anesthesia with the patient or authorized representative who has indicated his/her understanding and acceptance.   Dental advisory given  Plan Discussed with: CRNA and Anesthesiologist  Anesthesia Plan Comments:         Anesthesia Quick Evaluation

## 2014-09-05 NOTE — Op Note (Signed)
[facility] [place], [address]   ERCP PROCEDURE REPORT  PATIENT: Joshua Ali, Bartelt  MR# :[id number] BIRTHDATE: July 07, 1940  GENDER: male ENDOSCOPIST: Clarene Essex, MD REFERRED BY: PROCEDURE DATE:  09/05/2014 PROCEDURE:   ERCP with sphincterotomy/papillotomy, ERCP with removal of calculus/calculi , and ERCP with balloon dilation ASA CLASS:    3 INDICATIONS: positive Intra-Op cholangiogram MEDICATIONS:    general anesthesia TOPICAL ANESTHETIC:  per anesthesia  DESCRIPTION OF PROCEDURE:   After the risks benefits and alternatives of the procedure were thoroughly explained, informed consent was obtained.  The Pentax Ercp Scope [id number]  endoscope was introduced through the mouth and advanced to the second portion of the duodenum .a normal-appearing ampulla was brought into view and using the triple lumen sphincterotome loaded with the JAG Jagwire deep selective cannulation was obtained on the first attempt and the wire was advanced into the intrahepatics and on initial cholangiogram and obvious stone was seen and we proceeded with the sphincterotomy in the customary fashion until he had adequate biliary drainage and could get the fully bowed sphincterotome easily in and out of the duct and then we exchanged the sphincterotome for the adjustable balloon and on the first balloon pull-through a stone was delivered and possibly a few other tiny stones were delivered on subsequent balloon pull-throughs but after 2 negative pull-through's we proceeded with an occlusion cholangiogram which was normal except for tiny air bubbles and although the balloon pulled readily through the patent sphincterotomy site he did not seem to drain very well so we elected to remove the stone balloon and exchanged it for the dilating balloon 4 cm x 8 mm and proceeded with balloon dilation in the customary fashion and once that was done the patient had excellent biliary  drainage and the wire and the balloon were removed and the patient tolerated the procedure well there was no obvious immediate complication Estimated blood loss is zero unless otherwise noted in this procedure report.       COMPLICATIONS:   none  ENDOSCOPIC IMPRESSION:1. Normal ampulla status post sphincterotomy as above 2. No pancreatic duct injection or wires enhancement throughout the procedure not mentioned above 3. One medium size CBD stones and probable few tiny stones removed with minimal sludge as well 4. Negative occlusion cholangiogram however with sluggish drainage status post dilation as above with excellent drainage at the end of the procedure  RECOMMENDATIONS:customary post-ERCP orders call me when necessary if no delayed complication can slowly advance dietand hopefully home tomorrow and repeat liver tests as an outpatient on day of surgical follow-up and will check on tomorrow     _______________________________ eSigned:  Clarene Essex, MD 09/05/2014 12:12 PM   CC:

## 2014-09-05 NOTE — Progress Notes (Signed)
Patient ID: Joshua Ali, male   DOB: [birth year], 74 y.o.   MRN: [id number] 1 Day Post-Op  Subjective: Pt c/o some pain, but feels ok.    Objective: Vital signs in last 24 hours: Temp:  [97.9 F (36.6 C)-100 F (37.8 C)] 99.1 F (37.3 C) (05/04 0546) Pulse Rate:  [110-122] 110 (05/04 0546) Resp:  [12-18] 16 (05/04 0546) BP: (107-158)/(65-82) 107/82 mmHg (05/04 0546) SpO2:  [91 %-94 %] 94 % (05/04 0546) Last BM Date: 09/02/14  Intake/Output from previous day: 05/03 0701 - 05/04 0700 In: 3997.5 [P.O.:360; I.V.:3487.5; IV Piggyback:150] Out: 0  Intake/Output this shift:    PE: Abd: soft, appropriately tender, few BS, mild distention, incisions c/d/i with dermabond, colostomy present in LLQ  Lab Results:   Recent Labs  09/04/14 0500 09/05/14 0521  WBC 4.8 10.3  HGB 10.8* 11.0*  HCT 31.8* 33.0*  PLT 230 234   BMET  Recent Labs  09/03/14 0508 09/04/14 0500  NA 136 138  K 3.7 4.6  CL 98* 102  CO2 26 26  GLUCOSE 224* 194*  BUN 23* 17  CREATININE 1.04 1.18  CALCIUM 9.5 8.5*   PT/INR No results for input(s): LABPROT, INR in the last 72 hours. CMP     Component Value Date/Time   NA 138 09/04/2014 0500   K 4.6 09/04/2014 0500   CL 102 09/04/2014 0500   CO2 26 09/04/2014 0500   GLUCOSE 194* 09/04/2014 0500   BUN 17 09/04/2014 0500   CREATININE 1.18 09/04/2014 0500   CALCIUM 8.5* 09/04/2014 0500   PROT 6.1* 09/04/2014 0500   ALBUMIN 2.7* 09/04/2014 0500   AST 295* 09/04/2014 0500   ALT 348* 09/04/2014 0500   ALKPHOS 203* 09/04/2014 0500   BILITOT 3.8* 09/04/2014 0500   GFRNONAA >60 09/04/2014 0500   GFRAA >60 09/04/2014 0500   Lipase     Component Value Date/Time   LIPASE 67* 09/04/2014 0500       Studies/Results: Dg Cholangiogram Operative  09/04/2014   CLINICAL DATA:  Cholecystectomy.  Gallstones.  EXAM: INTRAOPERATIVE CHOLANGIOGRAM  TECHNIQUE: Cholangiographic images from the C-arm fluoroscopic device were submitted for interpretation  post-operatively. Please see the procedural report for the amount of contrast and the fluoroscopy time utilized.  COMPARISON:  None.  FINDINGS: There is an irregular oblong filling defect in the distal common bile duct, potentially resulting in impedance of flow of bile into the intestine. Contrast does fill the biliary tree and duodenum compatible with patency. However, there is reflux of contrast into the pancreatic duct supporting potential partial obstruction.  IMPRESSION: Patent biliary tree.  Findings are worrisome for a distal common bile duct stone or debris resulting in partial obstruction.   Electronically Signed   By: Jolaine Click M.D.   On: 09/04/2014 11:44   Ct Abdomen Pelvis W Contrast  09/03/2014   CLINICAL DATA:  Right-sided abdominal pain for 1 week. History of colorectal cancer.  EXAM: CT ABDOMEN AND PELVIS WITH CONTRAST  TECHNIQUE: Multidetector CT imaging of the abdomen and pelvis was performed using the standard protocol following bolus administration of intravenous contrast.  CONTRAST:  OMNIPAQUE IOHEXOL 300 MG/ML  SOLN  COMPARISON:  None.  FINDINGS: There is a 3 mm nodule in the left upper lobe on sequence 3, image 13. Dependent atelectasis in the lower lobes bilaterally. No evidence for pleural effusions. Negative for free air.  There is diffuse wall thickening of the gallbladder with surrounding inflammation. Small calcified stones within the gallbladder. Gallbladder  wall thickness measures up to 7 mm. There is minimal intrahepatic biliary dilatation. Portal venous system is patent. No significant dilatation of the extrahepatic bile duct.  Normal appearance of the pancreas, spleen and adrenal glands. Normal appearance of the right kidney. 1.6 cm low-density structure in the left kidney is suggestive for a cyst. Negative for hydronephrosis.  Calcifications in the coronary arteries.  Cardiac pacemaker leads.  There is irregular soft tissue with calcifications in the presacral space.  Suspect this is related to treatment changes from colon cancer. Soft tissue in this area measures 3.1 cm in the AP dimension. Small amount of fluid in the urinary bladder. The rectum appears to be surgically absent. Patient has a descending colostomy. There is some high-density material in the appendix but no evidence for appendix enlargement or inflammation. Normal appearance of the small bowel.  No acute bone abnormality. 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  IMPRESSION: Cholelithiasis with diffuse gallbladder wall thickening and surrounding inflammatory changes. Findings are suggestive for acute cholecystitis.  Postsurgical changes related to colon cancer with a descending colostomy. Soft tissue thickening with calcifications in the presacral space is compatible with post treatment changes.  Indeterminate 3 mm nodule in the left upper lobe. If the patient is at high risk for bronchogenic carcinoma, follow-up chest CT at 1 year is recommended. If the patient is at low risk, no follow-up is needed. This recommendation follows the consensus statement: Guidelines for Management of Small Pulmonary Nodules Detected on CT Scans: A Statement from the Fleischner Society as published in Radiology 2005; 237:395-400.   Electronically Signed   By: Richarda Overlie M.D.   On: 09/03/2014 09:05    Anti-infectives: Anti-infectives    Start     Dose/Rate Route Frequency Ordered Stop   09/04/14 1600  piperacillin-tazobactam (ZOSYN) IVPB 3.375 g    Comments:  Continue Zosyn until after ERCP   3.375 g 12.5 mL/hr over 240 Minutes Intravenous 3 times per day 09/04/14 1321     09/03/14 1115  piperacillin-tazobactam (ZOSYN) IVPB 3.375 g  Status:  Discontinued     3.375 g 12.5 mL/hr over 240 Minutes Intravenous 3 times per day 09/03/14 1101 09/04/14 1321   09/03/14 0930  piperacillin-tazobactam (ZOSYN) IVPB 3.375 g     3.375 g 100 mL/hr over 30 Minutes Intravenous  Once 09/03/14 0925 09/03/14  1023       Assessment/Plan  POD1, s/p lap chole with + IOC -NPO until ERCP today.  May advance diet after ERCP today from our standpoint -appreciate GI assistance  -will follow -check labs in am  LOS: 2 days    Syann Cupples E 09/05/2014, 7:57 AM Pager: [phone]

## 2014-09-05 NOTE — Anesthesia Postprocedure Evaluation (Signed)
  Anesthesia Post-op Note  Patient: [name]  Procedure(s) Performed: Procedure(s): ENDOSCOPIC RETROGRADE CHOLANGIOPANCREATOGRAPHY (ERCP) (N/A)  Patient Location: Endoscopy Unit  Anesthesia Type:General  Level of Consciousness: awake  Airway and Oxygen Therapy: Patient Spontanous Breathing  Post-op Pain: mild  Post-op Assessment: Post-op Vital signs reviewed  Post-op Vital Signs: Reviewed  Last Vitals:  Filed Vitals:   09/05/14 1311  BP: 155/67  Pulse: 113  Temp: 36.9 C  Resp: 20    Complications: No apparent anesthesia complications

## 2014-09-05 NOTE — Progress Notes (Signed)
Bradly Bienenstock Lingard 11:16 AM  Subjective: Patient doing better status post surgery without any new complaints and his hospital computer chart was reviewed and our procedure was discussed with he and his wife  Objective: Vital signs stable afebrile exam please see preassessment evaluation Intra-Op cholangiogram reviewed CT reviewed liver test increased other labs stable  Assessment: Positive for CBD stones  Plan: The risks benefits methods and success rate of ERCP was rediscussed with he and his wife and will proceed today with anesthesia assistance with further workup and plans pending those findings  [facility] E  Pager [phone] After 5PM or if no answer call [phone]

## 2014-09-05 NOTE — Care Management (Signed)
UR updated. Magdalen Spatz RN BSN

## 2014-09-05 NOTE — Op Note (Signed)
NAMEMELCHOR, KIRCHGESSNER NO.:  [id number]  MEDICAL RECORD NO.:  [id number]  LOCATION:  6N27C                        FACILITY:  [facility]  PHYSICIAN:  Coralie Keens, M.D. DATE OF BIRTH:  12/06/1940  DATE OF PROCEDURE:  09/04/2014 DATE OF DISCHARGE:                              OPERATIVE REPORT   PREOPERATIVE DIAGNOSIS:  Acute cholecystitis with cholelithiasis.  POSTOPERATIVE DIAGNOSIS:  Acute cholecystitis with cholelithiasis with possible choledocholithiasis.  PROCEDURE:  Laparoscopic cholecystectomy and intraoperative cholangiogram.  SURGEON:  Coralie Keens, M.D.  ASSISTANT:  Judyann Munson, RNFA  ANESTHESIA:  General endotracheal anesthesia.  ESTIMATED BLOOD LOSS:  Minimal.  INDICATIONS:  This is a 74 year old gentleman presented with acute cholecystitis.  He was found on morning of surgery to have elevated liver function test.  He is not a candidate for MRCP consistent with pacemaker insertion.  Because of his pain, decision was made to proceed to the operating room for cholecystectomy with cholangiogram.  FINDINGS:  The patient was found to have acute cholecystitis with hydrops of the gallbladder.  A cholangiogram was performed and suggested a large distal common bile duct stone.  PROCEDURE IN DETAIL:  The patient was brought to the operating room, identified as Joshua Ali.  He was placed supine on the operating room table and general anesthesia was induced.  His abdomen was then prepped and draped in usual sterile fashion.  A small incision was then made above the umbilicus with the scalpel.  This was carried down to the fascia, which was then opened with scalpel.  A hemostat was used to pass to the peritoneal cavity under direct vision.  A 0 Vicryl pursestring suture was then placed around the fascial opening.  The Hasson port was placed through the opening and insufflation of the abdomen was begun.  A 5-mm port was then placed in the  patient's epigastrium and two more in the right upper quadrant all under direct vision.  The gallbladder was found to be distended and covered with omentum.  I was able to take the omentum off the gallbladder and slightly elevated.  I then had to needle aspirate bile from the gallbladder in order to facilitate grasping the gallbladder better.  The bile was clear, given the impression of hydrops of the gallbladder.  The gallbladder was decompressed and then, I was able to elevate it above the liver bed.  Dissection was then carried out at the base of the gallbladder.  The entire gallbladder and base was inflamed.  I was able to dissect out the cystic duct and cystic artery, and achieved a critical window around both.  I clipped the artery proximally, distally and transected.  I then clipped the cystic duct distally with a clip and then partly opened with laparoscopic scissors. I made a separate skin incision to the right upper quadrant and placed a cholangiocatheter through this under direct vision.  I then placed a cholangiocatheter into the opening of the cystic duct.  A cholangiogram was performed with contrast.  There appeared to be a near-obstructing distal common bile duct stone.  At this point, the cholangiocatheter was removed.  I clipped the cystic duct three times proximally, then completely  transected it.  The gallbladder was then slowly dissected free from the liver bed with the electrocautery.  The gallbladder was then placed in an Endosac and removed through the incision at the umbilicus.  Again, the liver bed was examined and hemostasis felt to be achieved.  With 0 Vicryl, the umbilicus was tied and closing the fascial defect.  I then irrigated the right upper quadrant copiously with normal saline.  Again, hemostasis was appeared to be achieved.  All ports were removed under direct vision.  The abdomen was deflated.  All incisions were then anesthetized with Marcaine and  closed with 4-0 Monocryl subcuticular sutures.  Skin glue was then applied.  The patient tolerated the procedure well.  All the counts were correct at the end of procedure.  The patient was then extubated in the operating room and taken in a stable condition to the recovery room.     Coralie Keens, M.D.     DB/MEDQ  D:  09/04/2014  T:  09/05/2014  Job:  [id number]

## 2014-09-05 NOTE — Progress Notes (Signed)
[facility]                                                                              Patient Demographics  Joshua Ali, is a 74 y.o. male, DOB - 30-Mar-1941, XPF:[id number]  Admit date - 09/03/2014   Admitting Physician Annita Brod, MD  Outpatient Primary MD for the patient is Olin Hauser, MD  LOS - 2   Chief Complaint  Patient presents with  . Chest Pain  . Abdominal Pain       Brief HPI    Patient is a 74 year old male with sick sinus syndrome, pacemaker, colon CA with colostomy and colon resection, and DM on oral hypoglycemics, hypertension, chronic diastolic CHF presented with diffuse abdominal pain for at least 4 days. Patient also reported subjective fevers, generalized ache and abdominal pain on the right side radiating to the back. He noticed decreased appetite with occasional emesis with eating, sharp, no change in his stool character or color in the colostomy.  In ED, patient was febrile, hemodynamically stable, lab data was unremarkable except mild elevation of BUN of 23, glucose 224, white count of 10,700 with a neutrophil count 81%, transaminitis with alkaline phosphatase 144, but more concerning was abnormal transaminases with alk phos 144, lipase 130, AST 217 and ALT 1:30 with a total bilirubin 1.3.  CT abdomen showed cholelithiasis with diffuse gallbladder wall thickening and surrounding inflammatory changes concerning for acute cholecystitis. Incidental finding of an indeterminant 3 mm nodule left upper lobe.  The patient was admitted for further workup  Assessment & Plan    Principal Problem:   Acute cholecystitis with cholelithiasis, acute biliary pancreatitis - Continue broad-spectrum antibiotics, IV fluids - Patient underwent laparoscopic cholecystectomy with intraoperative cholangiogram - GI also following, ERCP today  Active Problems: Dehydration - Continue IV fluid hydration  Sinus node dysfunction, has  pacemaker  Hypertension: Currently stable  Diabetes mellitus - Sliding-scale insulin, NPO    Chronic diastolic heart failure, NYHA class 1 - Currently stable    Nodule of left lung-33m -Outpatient follow-up   Code Status: full code  Family Communication: Discussed in detail with the patient, all imaging results, lab results explained to the patient    Disposition Plan: not medically ready  Time Spent in minutes   25 minutes  Procedures  ERCP  Consults   General surgery  Gastroenterology   DVT Prophylaxis  heparin subcutaneous   Medications  Scheduled Meds: . amLODipine  5 mg Oral Daily  . folic acid  1 mg Intravenous Daily  . heparin  5,000 Units Subcutaneous 3 times per day  . insulin aspart  0-15 Units Subcutaneous 6 times per day  . losartan  100 mg Oral Daily  . piperacillin-tazobactam (ZOSYN)  IV  3.375 g Intravenous 3 times per day  . thiamine  100 mg Intravenous Daily   Continuous Infusions: . dextrose 5 % and 0.9 % NaCl with KCl 20 mEq/L 125 mL/hr (09/05/14 0945)   PRN Meds:.alum & mag hydroxide-simeth, morphine injection, ondansetron (ZOFRAN) IV   Antibiotics   Anti-infectives    Start     Dose/Rate Route Frequency Ordered Stop  09/04/14 1600  piperacillin-tazobactam (ZOSYN) IVPB 3.375 g    Comments:  Continue Zosyn until after ERCP   3.375 g 12.5 mL/hr over 240 Minutes Intravenous 3 times per day 09/04/14 1321     09/03/14 1115  piperacillin-tazobactam (ZOSYN) IVPB 3.375 g  Status:  Discontinued     3.375 g 12.5 mL/hr over 240 Minutes Intravenous 3 times per day 09/03/14 1101 09/04/14 1321   09/03/14 0930  piperacillin-tazobactam (ZOSYN) IVPB 3.375 g     3.375 g 100 mL/hr over 30 Minutes Intravenous  Once 09/03/14 0925 09/03/14 1023        Subjective:   Joshua Ali was seen and examined today.  Patient denies dizziness, chest pain, shortness of breath, new weakness, numbess, tingling. No acute events overnight.  Abdominal pain  controlled, no nausea or vomiting  Objective:   Blood pressure 155/67, pulse 113, temperature 98.4 F (36.9 C), temperature source Oral, resp. rate 20, height _0  (1.702 m), weight 83.915 kg (185 lb), SpO2 95 %.  Wt Readings from Last 3 Encounters:  09/03/14 83.915 kg (185 lb)  02/26/14 87.816 kg (193 lb 9.6 oz)  07/06/13 89.812 kg (198 lb)     Intake/Output Summary (Last 24 hours) at 09/05/14 1446 Last data filed at 09/05/14 1400  Gross per 24 hour  Intake   4435 ml  Output      0 ml  Net   4435 ml    Exam  General: Alert and oriented x 3, NAD  HEENT:  PERRLA, EOMI, Anicteic Sclera, mucous membranes moist.   Neck: Supple, no JVD, no masses  CVS: S1 S2 auscultated, no rubs, murmurs or gallops. Regular rate and rhythm.  Respiratory: Clear to auscultation bilaterally, no wheezing, rales or rhonchi  Abdomen: Soft,  mild tenderness in the right upper quadrant, + colostomy, + bowel sounds  Ext: no cyanosis clubbing or edema  Neuro: AAOx3, Cr N's II- XII. Strength 5/5 upper and lower extremities bilaterally  Skin: No rashes  Psych: Normal affect and demeanor, alert and oriented x3    Data Review   Micro Results Recent Results (from the past 240 hour(s))  Surgical pcr screen     Status: None   Collection Time: 09/03/14  9:18 PM  Result Value Ref Range Status   MRSA, PCR NEGATIVE NEGATIVE Final   Staphylococcus aureus NEGATIVE NEGATIVE Final    Comment:        The Xpert SA Assay (FDA approved for NASAL specimens in patients over 46 years of age), is one component of a comprehensive surveillance program.  Test performance has been validated by [facility] for patients greater than or equal to 75 year old. It is not intended to diagnose infection nor to guide or monitor treatment.     Radiology Reports Dg Cholangiogram Operative  09/04/2014   CLINICAL DATA:  Cholecystectomy.  Gallstones.  EXAM: INTRAOPERATIVE CHOLANGIOGRAM  TECHNIQUE: Cholangiographic  images from the C-arm fluoroscopic device were submitted for interpretation post-operatively. Please see the procedural report for the amount of contrast and the fluoroscopy time utilized.  COMPARISON:  None.  FINDINGS: There is an irregular oblong filling defect in the distal common bile duct, potentially resulting in impedance of flow of bile into the intestine. Contrast does fill the biliary tree and duodenum compatible with patency. However, there is reflux of contrast into the pancreatic duct supporting potential partial obstruction.  IMPRESSION: Patent biliary tree.  Findings are worrisome for a distal common bile duct stone or debris resulting in  partial obstruction.   Electronically Signed   By: Marybelle Killings M.D.   On: 09/04/2014 11:44   Ct Abdomen Pelvis W Contrast  09/03/2014   CLINICAL DATA:  Right-sided abdominal pain for 1 week. History of colorectal cancer.  EXAM: CT ABDOMEN AND PELVIS WITH CONTRAST  TECHNIQUE: Multidetector CT imaging of the abdomen and pelvis was performed using the standard protocol following bolus administration of intravenous contrast.  CONTRAST:  170mL OMNIPAQUE IOHEXOL 300 MG/ML  SOLN  COMPARISON:  None.  FINDINGS: There is a 3 mm nodule in the left upper lobe on sequence 3, image 13. Dependent atelectasis in the lower lobes bilaterally. No evidence for pleural effusions. Negative for free air.  There is diffuse wall thickening of the gallbladder with surrounding inflammation. Small calcified stones within the gallbladder. Gallbladder wall thickness measures up to 7 mm. There is minimal intrahepatic biliary dilatation. Portal venous system is patent. No significant dilatation of the extrahepatic bile duct.  Normal appearance of the pancreas, spleen and adrenal glands. Normal appearance of the right kidney. 1.6 cm low-density structure in the left kidney is suggestive for a cyst. Negative for hydronephrosis.  Calcifications in the coronary arteries.  Cardiac pacemaker leads.   There is irregular soft tissue with calcifications in the presacral space. Suspect this is related to treatment changes from colon cancer. Soft tissue in this area measures 3.1 cm in the AP dimension. Small amount of fluid in the urinary bladder. The rectum appears to be surgically absent. Patient has a descending colostomy. There is some high-density material in the appendix but no evidence for appendix enlargement or inflammation. Normal appearance of the small bowel.  No acute bone abnormality. 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  IMPRESSION: Cholelithiasis with diffuse gallbladder wall thickening and surrounding inflammatory changes. Findings are suggestive for acute cholecystitis.  Postsurgical changes related to colon cancer with a descending colostomy. Soft tissue thickening with calcifications in the presacral space is compatible with post treatment changes.  Indeterminate 3 mm nodule in the left upper lobe. If the patient is at high risk for bronchogenic carcinoma, follow-up chest CT at 1 year is recommended. If the patient is at low risk, no follow-up is needed. This recommendation follows the consensus statement: Guidelines for Management of Small Pulmonary Nodules Detected on CT Scans: A Statement from the [employer] as published in Radiology 2005; 237:395-400.   Electronically Signed   By: Markus Daft M.D.   On: 09/03/2014 09:05   Dg Chest Port 1 View  09/03/2014   CLINICAL DATA:  Chest pain and generalized abdominal pain since 08/24/2014.  EXAM: PORTABLE CHEST - 1 VIEW  COMPARISON:  07/04/2013  FINDINGS: There is a shallow inspiration with mild associated crowding of the basilar markings. There is no confluent airspace consolidation. There is no large effusion.  There are intact appearances of the dual-chamber transvenous leads. Hilar, mediastinal and cardiac contours are unremarkable and unchanged  IMPRESSION: No acute cardiopulmonary findings.  Shallow  inspiration.   Electronically Signed   By: Andreas Newport M.D.   On: 09/03/2014 05:44   Dg Ercp Biliary & Pancreatic Ducts  09/05/2014   CLINICAL DATA:  Cholecystectomy  EXAM: ERCP  TECHNIQUE: Multiple spot images obtained with the fluoroscopic device and submitted for interpretation post-procedure.  FLUOROSCOPY TIME:  3 minutes and 6 seconds  COMPARISON:  None.  FINDINGS: Images demonstrate cannulation of the common bile duct and balloon retraction of stones.  IMPRESSION: ERCP and balloon sweeping of  the duct.  These images were submitted for radiologic interpretation only. Please see the procedural report for the amount of contrast and the fluoroscopy time utilized.   Electronically Signed   By: Marybelle Killings M.D.   On: 09/05/2014 14:42    CBC  Recent Labs Lab 09/03/14 0508 09/04/14 0500 09/05/14 0521  WBC 10.7* 4.8 10.3  HGB 13.6 10.8* 11.0*  HCT 38.9* 31.8* 33.0*  PLT 282 230 234  MCV 86.4 87.4 88.5  MCH 30.2 29.7 29.5  MCHC 35.0 34.0 33.3  RDW 11.9 11.9 12.0  LYMPHSABS 1.0  --  0.4*  MONOABS 1.0  --  0.4  EOSABS 0.1  --  0.2  BASOSABS 0.0  --  0.0    Chemistries   Recent Labs Lab 09/03/14 0508 09/04/14 0500 09/05/14 0521  NA 136 138 137  K 3.7 4.6 3.8  CL 98* 102 102  CO2 _0 GLUCOSE 224* 194* 170*  BUN 23* 17 10  CREATININE 1.04 1.18 1.11  CALCIUM 9.5 8.5* 8.2*  AST 217* 295* 165*  ALT 130* 348* 276*  ALKPHOS 144* 203* 261*  BILITOT 1.3* 3.8* 4.8*   ------------------------------------------------------------------------------------------------------------------ estimated creatinine clearance is 63.2 mL/min (by C-G formula based on Cr of 1.11). ------------------------------------------------------------------------------------------------------------------ No results for input(s): HGBA1C in the last 72 hours. ------------------------------------------------------------------------------------------------------------------ No results for input(s):  CHOL, HDL, LDLCALC, TRIG, CHOLHDL, LDLDIRECT in the last 72 hours. ------------------------------------------------------------------------------------------------------------------ No results for input(s): TSH, T4TOTAL, T3FREE, THYROIDAB in the last 72 hours.  Invalid input(s): FREET3 ------------------------------------------------------------------------------------------------------------------ No results for input(s): VITAMINB12, FOLATE, FERRITIN, TIBC, IRON, RETICCTPCT in the last 72 hours.  Coagulation profile No results for input(s): INR, PROTIME in the last 168 hours.  No results for input(s): DDIMER in the last 72 hours.  Cardiac Enzymes No results for input(s): CKMB, TROPONINI, MYOGLOBIN in the last 168 hours.  Invalid input(s): CK ------------------------------------------------------------------------------------------------------------------ Invalid input(s): POCBNP   Recent Labs  09/04/14 1552 09/04/14 2002 09/04/14 2347 09/05/14 0423 09/05/14 0810 09/05/14 1337  GLUCAP 128* 182* 145* 183* 179* 135*     RAI,RIPUDEEP M.D. [facility] 09/05/2014, 2:46 PM  Pager: [phone]   Between 7am to 7pm - call Pager - [phone]  After 7pm go to [email or web address] - password [email or web address]  Call night coverage person covering after 7pm

## 2014-09-05 NOTE — Anesthesia Procedure Notes (Signed)
Procedure Name: Intubation Performed by: Judeth Cornfield T Pre-anesthesia Checklist: Patient identified, Emergency Drugs available, Suction available and Patient being monitored Patient Re-evaluated:Patient Re-evaluated prior to inductionOxygen Delivery Method: Circle system utilized Preoxygenation: Pre-oxygenation with 100% oxygen Intubation Type: IV induction Ventilation: Mask ventilation without difficulty Laryngoscope Size: Mac and 3 Grade View: Grade I Tube type: Oral Tube size: 7.0 mm Number of attempts: 1 Airway Equipment and Method: Stylet Placement Confirmation: ETT inserted through vocal cords under direct vision,  positive ETCO2,  CO2 detector and breath sounds checked- equal and bilateral Secured at: 22 cm Tube secured with: Tape

## 2014-09-06 ENCOUNTER — Encounter (HOSPITAL_COMMUNITY): Payer: Self-pay | Admitting: Gastroenterology

## 2014-09-06 LAB — CBC
HCT: 30.8 % — ABNORMAL LOW (ref 39.0–52.0)
Hemoglobin: 10.3 g/dL — ABNORMAL LOW (ref 13.0–17.0)
MCH: 29.4 pg (ref 26.0–34.0)
MCHC: 33.4 g/dL (ref 30.0–36.0)
MCV: 88 fL (ref 78.0–100.0)
Platelets: 190 10*3/uL (ref 150–400)
RBC: 3.5 MIL/uL — ABNORMAL LOW (ref 4.22–5.81)
RDW: 11.9 % (ref 11.5–15.5)
WBC: 9.2 10*3/uL (ref 4.0–10.5)

## 2014-09-06 LAB — COMPREHENSIVE METABOLIC PANEL
ALT: 180 U/L — ABNORMAL HIGH (ref 17–63)
AST: 58 U/L — ABNORMAL HIGH (ref 15–41)
Albumin: 2.3 g/dL — ABNORMAL LOW (ref 3.5–5.0)
Alkaline Phosphatase: 248 U/L — ABNORMAL HIGH (ref 38–126)
Anion gap: 7 (ref 5–15)
BUN: 6 mg/dL (ref 6–20)
CO2: 23 mmol/L (ref 22–32)
Calcium: 7.7 mg/dL — ABNORMAL LOW (ref 8.9–10.3)
Chloride: 106 mmol/L (ref 101–111)
Creatinine, Ser: 1.05 mg/dL (ref 0.61–1.24)
GFR calc Af Amer: >60 mL/min (ref >60)
GFR calc non Af Amer: >60 mL/min (ref >60)
Glucose, Bld: 168 mg/dL — ABNORMAL HIGH (ref 70–99)
Potassium: 4 mmol/L (ref 3.5–5.1)
Sodium: 136 mmol/L (ref 135–145)
Total Bilirubin: 5.1 mg/dL — ABNORMAL HIGH (ref 0.3–1.2)
Total Protein: 5.3 g/dL — ABNORMAL LOW (ref 6.5–8.1)

## 2014-09-06 LAB — GLUCOSE, CAPILLARY
Glucose-Capillary: 158 mg/dL — ABNORMAL HIGH (ref 70–99)
Glucose-Capillary: 168 mg/dL — ABNORMAL HIGH (ref 70–99)

## 2014-09-06 MED ORDER — POLYETHYLENE GLYCOL 3350 17 G PO PACK: 17.0000 g | PACK | Freq: Every day | ORAL | Status: DC

## 2014-09-06 MED ORDER — PROMETHAZINE HCL 12.5 MG PO TABS: 12.5000 mg | ORAL_TABLET | Freq: Four times a day (QID) | ORAL | Status: DC | PRN

## 2014-09-06 MED ORDER — POLYETHYLENE GLYCOL 3350 17 G PO PACK
17.0000 g | PACK | Freq: Every day | ORAL | Status: DC
  Administered 2014-09-06: 17 g via ORAL
  Filled 2014-09-06: qty 1

## 2014-09-06 MED ORDER — HYDROCODONE-ACETAMINOPHEN 5-325 MG PO TABS: 1.0000 | ORAL_TABLET | Freq: Three times a day (TID) | ORAL | Status: DC | PRN

## 2014-09-06 NOTE — Progress Notes (Signed)
[facility] PROGRESS NOTE Subjective patient feels fine with no nausea or abdominal pain. He apparently did have something to eat last night without much problem.  Objective: Vital signs in last 24 hours: Temp:  [98 F (36.7 C)-100 F (37.8 C)] 98.7 F (37.1 C) (05/05 0548) Pulse Rate:  [107-123] 107 (05/05 0548) Resp:  [16-22] 18 (05/05 0548) BP: (103-155)/(52-113) 132/75 mmHg (05/05 0548) SpO2:  [85 %-95 %] 93 % (05/05 0548) Last BM Date: 09/02/14  Intake/Output from previous day: 05/04 0701 - 05/05 0700 In: 2070 [P.O.:220; I.V.:1800; IV Piggyback:50] Out: 300 [Urine:300] Intake/Output this shift:    PE: General--no acute distress  Lungs--clear Abdomen-- none distended with good bowel sounds soft with very mild if any right upper quadrant tenderness  Lab Results:  Recent Labs  09/04/14 0500 09/05/14 0521 09/06/14 0335  WBC 4.8 10.3 9.2  HGB 10.8* 11.0* 10.3*  HCT 31.8* 33.0* 30.8*  PLT 230 234 190   BMET  Recent Labs  09/04/14 0500 09/05/14 0521 09/06/14 0335  NA 138 137 136  K 4.6 3.8 4.0  CL 102 102 106  CO2 26 25 23   CREATININE 1.18 1.11 1.05   LFT  Recent Labs  09/04/14 0500 09/05/14 0521 09/06/14 0335  PROT 6.1* 5.6* 5.3*  AST 295* 165* 58*  ALT 348* 276* 180*  ALKPHOS 203* 261* 248*  BILITOT 3.8* 4.8* 5.1*   PT/INR No results for input(s): LABPROT, INR in the last 72 hours. PANCREAS  Recent Labs  09/04/14 0500  LIPASE 67*         Studies/Results: Dg Cholangiogram Operative  09/04/2014   CLINICAL DATA:  Cholecystectomy.  Gallstones.  EXAM: INTRAOPERATIVE CHOLANGIOGRAM  TECHNIQUE: Cholangiographic images from the C-arm fluoroscopic device were submitted for interpretation post-operatively. Please see the procedural report for the amount of contrast and the fluoroscopy time utilized.  COMPARISON:  None.  FINDINGS: There is an irregular oblong filling defect in the distal common bile duct, potentially resulting in  impedance of flow of bile into the intestine. Contrast does fill the biliary tree and duodenum compatible with patency. However, there is reflux of contrast into the pancreatic duct supporting potential partial obstruction.  IMPRESSION: Patent biliary tree.  Findings are worrisome for a distal common bile duct stone or debris resulting in partial obstruction.   Electronically Signed   By: Marybelle Killings M.D.   On: 09/04/2014 11:44   Dg Ercp Biliary & Pancreatic Ducts  09/05/2014   CLINICAL DATA:  Cholecystectomy  EXAM: ERCP  TECHNIQUE: Multiple spot images obtained with the fluoroscopic device and submitted for interpretation post-procedure.  FLUOROSCOPY TIME:  3 minutes and 6 seconds  COMPARISON:  None.  FINDINGS: Images demonstrate cannulation of the common bile duct and balloon retraction of stones.  IMPRESSION: ERCP and balloon sweeping of the duct.  These images were submitted for radiologic interpretation only. Please see the procedural report for the amount of contrast and the fluoroscopy time utilized.   Electronically Signed   By: Marybelle Killings M.D.   On: 09/05/2014 14:42    Medications: I have reviewed the patient's current medications.  Assessment/Plan: 1. CBD stone. Status post ERCP and sphincterotomy yesterday. Bilirubin up slightly higher today but probably due to edema. I think it would be fine to send him on home and arrange his labs to be repeated next week and if his bilirubin is coming down no other testing would be needed. Please call us for any further problems.   Natalee Tomkiewicz JR,Zarahi Fuerst L 09/06/2014,  7:79 AM  Pager: [phone] If no answer or after hours call [phone]

## 2014-09-06 NOTE — Progress Notes (Signed)
Patient ID: Joshua Ali, male   DOB: Nov 27, 1940, 74 y.o.   MRN: [id number] 1 Day Post-Op  Subjective: Pt feels well this morning.  No pain.  Tolerating a regular diet.  Objective: Vital signs in last 24 hours: Temp:  [98 F (36.7 C)-100 F (37.8 C)] 98.7 F (37.1 C) (05/05 0548) Pulse Rate:  [107-123] 107 (05/05 0548) Resp:  [16-22] 18 (05/05 0548) BP: (103-155)/(52-113) 132/75 mmHg (05/05 0548) SpO2:  [85 %-95 %] 93 % (05/05 0548) Last BM Date: 09/02/14  Intake/Output from previous day: 05/04 0701 - 05/05 0700 In: 2070 [P.O.:220; I.V.:1800; IV Piggyback:50] Out: 300 [Urine:300] Intake/Output this shift:    PE: Abd: soft, minimally tender, +BS, ND, incisions c/d/i, ostomy present  Lab Results:   Recent Labs  09/05/14 0521 09/06/14 0335  WBC 10.3 9.2  HGB 11.0* 10.3*  HCT 33.0* 30.8*  PLT 234 190   BMET  Recent Labs  09/05/14 0521 09/06/14 0335  NA 137 136  K 3.8 4.0  CL 102 106  CO2 25 23  GLUCOSE 170* 168*  BUN 10 6  CREATININE 1.11 1.05  CALCIUM 8.2* 7.7*   PT/INR No results for input(s): LABPROT, INR in the last 72 hours. CMP     Component Value Date/Time   NA 136 09/06/2014 0335   K 4.0 09/06/2014 0335   CL 106 09/06/2014 0335   CO2 23 09/06/2014 0335   GLUCOSE 168* 09/06/2014 0335   BUN 6 09/06/2014 0335   CREATININE 1.05 09/06/2014 0335   CALCIUM 7.7* 09/06/2014 0335   PROT 5.3* 09/06/2014 0335   ALBUMIN 2.3* 09/06/2014 0335   AST 58* 09/06/2014 0335   ALT 180* 09/06/2014 0335   ALKPHOS 248* 09/06/2014 0335   BILITOT 5.1* 09/06/2014 0335   GFRNONAA >60 09/06/2014 0335   GFRAA >60 09/06/2014 0335   Lipase     Component Value Date/Time   LIPASE 67* 09/04/2014 0500       Studies/Results: Dg Cholangiogram Operative  09/04/2014   CLINICAL DATA:  Cholecystectomy.  Gallstones.  EXAM: INTRAOPERATIVE CHOLANGIOGRAM  TECHNIQUE: Cholangiographic images from the C-arm fluoroscopic device were submitted for interpretation post-operatively.  Please see the procedural report for the amount of contrast and the fluoroscopy time utilized.  COMPARISON:  None.  FINDINGS: There is an irregular oblong filling defect in the distal common bile duct, potentially resulting in impedance of flow of bile into the intestine. Contrast does fill the biliary tree and duodenum compatible with patency. However, there is reflux of contrast into the pancreatic duct supporting potential partial obstruction.  IMPRESSION: Patent biliary tree.  Findings are worrisome for a distal common bile duct stone or debris resulting in partial obstruction.   Electronically Signed   By: Jolaine Click M.D.   On: 09/04/2014 11:44   Dg Ercp Biliary & Pancreatic Ducts  09/05/2014   CLINICAL DATA:  Cholecystectomy  EXAM: ERCP  TECHNIQUE: Multiple spot images obtained with the fluoroscopic device and submitted for interpretation post-procedure.  FLUOROSCOPY TIME:  3 minutes and 6 seconds  COMPARISON:  None.  FINDINGS: Images demonstrate cannulation of the common bile duct and balloon retraction of stones.  IMPRESSION: ERCP and balloon sweeping of the duct.  These images were submitted for radiologic interpretation only. Please see the procedural report for the amount of contrast and the fluoroscopy time utilized.   Electronically Signed   By: Jolaine Click M.D.   On: 09/05/2014 14:42    Anti-infectives: Anti-infectives    Start  Dose/Rate Route Frequency Ordered Stop   09/04/14 1600  piperacillin-tazobactam (ZOSYN) IVPB 3.375 g    Comments:  Continue Zosyn until after ERCP   3.375 g 12.5 mL/hr over 240 Minutes Intravenous 3 times per day 09/04/14 1321     09/03/14 1115  piperacillin-tazobactam (ZOSYN) IVPB 3.375 g  Status:  Discontinued     3.375 g 12.5 mL/hr over 240 Minutes Intravenous 3 times per day 09/03/14 1101 09/04/14 1321   09/03/14 0930  piperacillin-tazobactam (ZOSYN) IVPB 3.375 g     3.375 g 100 mL/hr over 30 Minutes Intravenous  Once 09/03/14 0925 09/03/14 1023        Assessment/Plan  POD 2, s/p lap chole with + IOC POD1, s/p ERCP with stone extraction -TB went up slightly, but this isn't unexpected.  D/w Dr. Magnus Ivan and Dr. Randa Evens.  Both surgery and GI services are ok with patient being discharged home.  I have written for him to get a CMET checked on Tuesday and have the results faxed to our office to follow this up. -follow up for his surgery will also be arranged as well.  -he is surgically stable for dc home today.   LOS: 3 days    Joshua Ali E 09/06/2014, 8:19 AM Pager: [phone]

## 2014-09-06 NOTE — Progress Notes (Signed)
Discharge instructions given to patient and wife. Orders clarified and all questions answered. Patient was discharged via wheelchair by volunteers with wife. Jimmie Molly, RN

## 2014-09-06 NOTE — Discharge Summary (Signed)
Physician Discharge Summary   Patient ID: Joshua Ali MRN: [id number] DOB/AGE: 1941/03/02 74 y.o.  Admit date: 09/03/2014 Discharge date: 09/06/2014  Primary Care Physician:  Olin Hauser, MD  Discharge Diagnoses:   . Acute cholecystitis  Acute biliary pancreatitis  . Metabolic acidosis . Mild dehydration . HYPERTENSION, BENIGN . Pacemaker-[employer] . Sinus node dysfunction . Chronic diastolic heart failure, NYHA class 1 . Nodule of left lung-38m  Consults:  Gen. surgery   Recommendations for Outpatient Follow-up:  CT abdomen and pelvis also showed 3 mm nodule in the left upper lobe of the lung, outpatient follow-up recommended    DIET: Carb modified diet  Labs to be followed Please check complete metabolic profile including LFTs for the trend    Allergies:   Allergies  Allergen Reactions  . Oxycodone-Acetaminophen Other (See Comments)    Passes out      Discharge Medications:   Medication List    STOP taking these medications        acetaminophen 650 MG CR tablet  Commonly known as:  TYLENOL     atorvastatin 40 MG tablet  Commonly known as:  LIPITOR      TAKE these medications        amLODipine 5 MG tablet  Commonly known as:  NORVASC  Take 5 mg by mouth daily.     aspirin 81 MG tablet  Take 81 mg by mouth daily.     calcium carbonate 600 MG Tabs tablet  Commonly known as:  OS-CAL  Take 600 mg by mouth daily with breakfast.     cetirizine 10 MG tablet  Commonly known as:  ZYRTEC  Take 10 mg by mouth daily.     fluticasone 50 MCG/ACT nasal spray  Commonly known as:  FLONASE  Place 2 sprays into both nostrils as needed for allergies or rhinitis.     glimepiride 2 MG tablet  Commonly known as:  AMARYL  Take 2 mg by mouth daily.     HYDROcodone-acetaminophen 5-325 MG per tablet  Commonly known as:  NORCO/VICODIN  Take 1 tablet by mouth every 8 (eight) hours as needed for severe pain.     losartan-hydrochlorothiazide 100-25 MG per  tablet  Commonly known as:  HYZAAR  Take 1 tablet by mouth daily.     meloxicam 7.5 MG tablet  Commonly known as:  MOBIC  Take 7.5 mg by mouth daily.     metFORMIN 1000 MG tablet  Commonly known as:  GLUCOPHAGE  Take 1,000 mg by mouth 2 (two) times daily with a meal.     multivitamin with minerals Tabs tablet  Take 1 tablet by mouth daily.     neomycin-bacitracin-polymyxin ointment  Commonly known as:  NEOSPORIN  Apply 1 application topically as needed for wound care. apply to eye     polyethylene glycol packet  Commonly known as:  MIRALAX / GLYCOLAX  Take 17 g by mouth daily.     promethazine 12.5 MG tablet  Commonly known as:  PHENERGAN  Take 1 tablet (12.5 mg total) by mouth every 6 (six) hours as needed for nausea or vomiting.     Vitamin D-3 1000 UNITS Caps  Take 1 capsule by mouth daily.         Brief H and P: For complete details please refer to admission H and P, but in brief Patient is a 74year old male with sick sinus syndrome, pacemaker, colon CA with colostomy and colon resection, and DM on oral hypoglycemics,  hypertension, chronic diastolic CHF presented with diffuse abdominal pain for at least 4 days. Patient also reported subjective fevers, generalized ache and abdominal pain on the right side radiating to the back. He noticed decreased appetite with occasional emesis with eating, sharp, no change in his stool character or color in the colostomy.  In ED, patient was febrile, hemodynamically stable, lab data was unremarkable except mild elevation of BUN of 23, glucose 224, white count of 10,700 with a neutrophil count 81%, transaminitis with alkaline phosphatase 144, but more concerning was abnormal transaminases with alk phos 144, lipase 130, AST 217 and ALT 1:30 with a total bilirubin 1.3.  CT abdomen showed cholelithiasis with diffuse gallbladder wall thickening and surrounding inflammatory changes concerning for acute cholecystitis. Incidental finding of an  indeterminant 3 mm nodule left upper lobe.  The patient was admitted for further workup  Hospital Course:  Acute cholecystitis with cholelithiasis, acute biliary pancreatitis Patient was admitted with diffuse abdominal pain for at least 4 days with subjective fevers. CT of the abdomen showed cholelithiasis with diffuse gallbladder wall thickening and surrounding inflammatory changes concerning for acute cholecystitis. General surgery was consulted. Patient underwent laparoscopic cholecystectomy with intraoperative cholangiogram. Subsequently patient underwent ERCP with the GI, which was essentially normal, normal ampulla, status post enterotomy, one medium size CBD stone and probable few tiny stones removed with a minimal sludge, negative occlusion cholangiogram. Patient was cleared by general surgery to be discharged, he is tolerating solid diet without any difficulty. Total bilirubin is up slightly after the ERCP, otherwise patient is doing well, patient will have outpatient labs next week, to be follow-up by general surgery and GI.   Sinus node dysfunction, has pacemaker  Hypertension: Currently stable  Diabetes mellitus - Patient can resume metformin at home, continue carb modified diet   Chronic diastolic heart failure, NYHA class 1 - Currently stable   Nodule of left lung-38m -CT abdomen and pelvis showed incidentally nodule in the left lung 3 mm, outpatient follow-up recommended.  Hyperlipidemia - Statins currently on hold due to transaminitis  Day of Discharge BP 132/75 mmHg  Pulse 107  Temp(Src) 98.7 F (37.1 C) (Oral)  Resp 18  Ht _0  (1.702 m)  Wt 83.915 kg (185 lb)  BMI 28.97 kg/m2  SpO2 93%  Physical Exam: General: Alert and awake oriented x3 not in any acute distress. HEENT: anicteric sclera, pupils reactive to light and accommodation CVS: S1-S2 clear no murmur rubs or gallops Chest: clear to auscultation bilaterally, no wheezing rales or rhonchi Abdomen:  soft nontender, nondistended, normal bowel sounds, incisions C/D/I, ostomy Extremities: no cyanosis, clubbing or edema noted bilaterally Neuro: Cranial nerves II-XII intact, no focal neurological deficits   The results of significant diagnostics from this hospitalization (including imaging, microbiology, ancillary and laboratory) are listed below for reference.    LAB RESULTS: Basic Metabolic Panel:  Recent Labs Lab 09/05/14 0521 09/06/14 0335  NA 137 136  K 3.8 4.0  CL 102 106  CO2 25 23  GLUCOSE 170* 168*  BUN 10 6  CREATININE 1.11 1.05  CALCIUM 8.2* 7.7*   Liver Function Tests:  Recent Labs Lab 09/05/14 0521 09/06/14 0335  AST 165* 58*  ALT 276* 180*  ALKPHOS 261* 248*  BILITOT 4.8* 5.1*  PROT 5.6* 5.3*  ALBUMIN 2.5* 2.3*    Recent Labs Lab 09/03/14 0508 09/04/14 0500  LIPASE 130* 67*   No results for input(s): AMMONIA in the last 168 hours. CBC:  Recent Labs Lab 09/05/14 0521 09/06/14  0335  WBC 10.3 9.2  NEUTROABS 9.4*  --   HGB 11.0* 10.3*  HCT 33.0* 30.8*  MCV 88.5 88.0  PLT 234 190   Cardiac Enzymes: No results for input(s): CKTOTAL, CKMB, CKMBINDEX, TROPONINI in the last 168 hours. BNP: Invalid input(s): POCBNP CBG:  Recent Labs Lab 09/06/14 0341 09/06/14 0728  GLUCAP 158* 168*    Significant Diagnostic Studies:  Dg Cholangiogram Operative  09/04/2014   CLINICAL DATA:  Cholecystectomy.  Gallstones.  EXAM: INTRAOPERATIVE CHOLANGIOGRAM  TECHNIQUE: Cholangiographic images from the C-arm fluoroscopic device were submitted for interpretation post-operatively. Please see the procedural report for the amount of contrast and the fluoroscopy time utilized.  COMPARISON:  None.  FINDINGS: There is an irregular oblong filling defect in the distal common bile duct, potentially resulting in impedance of flow of bile into the intestine. Contrast does fill the biliary tree and duodenum compatible with patency. However, there is reflux of contrast into  the pancreatic duct supporting potential partial obstruction.  IMPRESSION: Patent biliary tree.  Findings are worrisome for a distal common bile duct stone or debris resulting in partial obstruction.   Electronically Signed   By: Marybelle Killings M.D.   On: 09/04/2014 11:44   Ct Abdomen Pelvis W Contrast  09/03/2014   CLINICAL DATA:  Right-sided abdominal pain for 1 week. History of colorectal cancer.  EXAM: CT ABDOMEN AND PELVIS WITH CONTRAST  TECHNIQUE: Multidetector CT imaging of the abdomen and pelvis was performed using the standard protocol following bolus administration of intravenous contrast.  CONTRAST:  167m OMNIPAQUE IOHEXOL 300 MG/ML  SOLN  COMPARISON:  None.  FINDINGS: There is a 3 mm nodule in the left upper lobe on sequence 3, image 13. Dependent atelectasis in the lower lobes bilaterally. No evidence for pleural effusions. Negative for free air.  There is diffuse wall thickening of the gallbladder with surrounding inflammation. Small calcified stones within the gallbladder. Gallbladder wall thickness measures up to 7 mm. There is minimal intrahepatic biliary dilatation. Portal venous system is patent. No significant dilatation of the extrahepatic bile duct.  Normal appearance of the pancreas, spleen and adrenal glands. Normal appearance of the right kidney. 1.6 cm low-density structure in the left kidney is suggestive for a cyst. Negative for hydronephrosis.  Calcifications in the coronary arteries.  Cardiac pacemaker leads.  There is irregular soft tissue with calcifications in the presacral space. Suspect this is related to treatment changes from colon cancer. Soft tissue in this area measures 3.1 cm in the AP dimension. Small amount of fluid in the urinary bladder. The rectum appears to be surgically absent. Patient has a descending colostomy. There is some high-density material in the appendix but no evidence for appendix enlargement or inflammation. Normal appearance of the small bowel.  No acute  bone abnormality. 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  IMPRESSION: Cholelithiasis with diffuse gallbladder wall thickening and surrounding inflammatory changes. Findings are suggestive for acute cholecystitis.  Postsurgical changes related to colon cancer with a descending colostomy. Soft tissue thickening with calcifications in the presacral space is compatible with post treatment changes.  Indeterminate 3 mm nodule in the left upper lobe. If the patient is at high risk for bronchogenic carcinoma, follow-up chest CT at 1 year is recommended. If the patient is at low risk, no follow-up is needed. This recommendation follows the consensus statement: Guidelines for Management of Small Pulmonary Nodules Detected on CT Scans: A Statement from the [employer] published in Radiology 2005;  903:014-996.   Electronically Signed   By: Markus Daft M.D.   On: 09/03/2014 09:05   Dg Chest Port 1 View  09/03/2014   CLINICAL DATA:  Chest pain and generalized abdominal pain since 08/24/2014.  EXAM: PORTABLE CHEST - 1 VIEW  COMPARISON:  07/04/2013  FINDINGS: There is a shallow inspiration with mild associated crowding of the basilar markings. There is no confluent airspace consolidation. There is no large effusion.  There are intact appearances of the dual-chamber transvenous leads. Hilar, mediastinal and cardiac contours are unremarkable and unchanged  IMPRESSION: No acute cardiopulmonary findings.  Shallow inspiration.   Electronically Signed   By: Andreas Newport M.D.   On: 09/03/2014 05:44    2D ECHO:   Disposition and Follow-up:     Discharge Instructions    Diet Carb Modified    Complete by:  As directed      Discharge instructions    Complete by:  As directed   Please hold LIpitor until liver function tests have normalized.     Increase activity slowly    Complete by:  As directed             DISPOSITION: Home   DISCHARGE FOLLOW-UP Follow-up Information     Follow up with [facility] On 09/25/2014.   Specialty:  General Surgery   Why:  Doc of the [facility], 3:00pm, arrive no later than 2:30pm for paperwork   Contact information:   [address] [place] [address] [phone]       Schedule an appointment as soon as possible for a visit with Olin Hauser, MD.   Specialty:  Family Medicine   Why:  obtain labs on Monday for Complete metabolic profile including liver function tests, and fax results to [facility] office. Please follow-up with your PCP in 7-10days.     Contact information:   [place] [address] [phone]       Follow up with EDWARDS JR,JAMES L, MD. Schedule an appointment as soon as possible for a visit in 2 weeks.   Specialty:  Gastroenterology   Why:  for hospital follow-up   Contact information:   [address] [place] [address] [phone]        Time spent on Discharge: 35 mins   Signed:   RAI,RIPUDEEP M.D. [facility] 09/06/2014, 2:10 PM Pager: [phone]

## 2014-09-06 NOTE — Discharge Instructions (Signed)
[facility] ______[facility] °LAPAROSCOPIC SURGERY: POST OP INSTRUCTIONS °Always review your discharge instruction sheet given to you by the facility where your surgery was performed. °IF YOU HAVE DISABILITY OR FAMILY LEAVE FORMS, YOU MUST BRING THEM TO THE OFFICE FOR PROCESSING.   °DO NOT GIVE THEM TO YOUR DOCTOR. ° °1. A prescription for pain medication may be given to you upon discharge.  Take your pain medication as prescribed, if needed.  If narcotic pain medicine is not needed, then you may take acetaminophen (Tylenol) or ibuprofen (Advil) as needed. °2. Take your usually prescribed medications unless otherwise directed. °3. If you need a refill on your pain medication, please contact your pharmacy.  They will contact our office to request authorization. Prescriptions will not be filled after 5pm or on week-ends. °4. You should follow a light diet the first few days after arrival home, such as soup and crackers, etc.  Be sure to include lots of fluids daily. °5. Most patients will experience some swelling and bruising in the area of the incisions.  Ice packs will help.  Swelling and bruising can take several days to resolve.  °6. It is common to experience some constipation if taking pain medication after surgery.  Increasing fluid intake and taking a stool softener (such as Colace) will usually help or prevent this problem from occurring.  A mild laxative (Milk of Magnesia or Miralax) should be taken according to package instructions if there are no bowel movements after 48 hours. °7. Unless discharge instructions indicate otherwise, you may remove your bandages 24-48 hours after surgery, and you may shower at that time.  You may have steri-strips (small skin tapes) in place directly over the incision.  These strips should be left on the skin for 7-10 days.  If your surgeon used skin glue on the incision, you may shower in 24 hours.  The glue will flake off over the next 2-3 weeks.  Any sutures or  staples will be removed at the office during your follow-up visit. °8. ACTIVITIES:  You may resume regular (light) daily activities beginning the next day--such as daily self-care, walking, climbing stairs--gradually increasing activities as tolerated.  You may have sexual intercourse when it is comfortable.  Refrain from any heavy lifting or straining until approved by your doctor. °a. You may drive when you are no longer taking prescription pain medication, you can comfortably wear a seatbelt, and you can safely maneuver your car and apply brakes. °b. RETURN TO WORK:  __________________________________________________________ °9. You should see your doctor in the office for a follow-up appointment approximately 2-3 weeks after your surgery.  Make sure that you call for this appointment within a day or two after you arrive home to insure a convenient appointment time. °10. OTHER INSTRUCTIONS: __________________________________________________________________________________________________________________________ __________________________________________________________________________________________________________________________ °WHEN TO CALL YOUR DOCTOR: °1. Fever over 101.0 °2. Inability to urinate °3. Continued bleeding from incision. °4. Increased pain, redness, or drainage from the incision. °5. Increasing abdominal pain ° °The clinic staff is available to answer your questions during regular business hours.  Please don’t hesitate to call and ask to speak to one of the nurses for clinical concerns.  If you have a medical emergency, go to the nearest emergency room or call 911.  A surgeon from [facility] is always on call at the hospital. °[address], [address], [place], [place]  [address] ? [address] [address], [place],    [address] °[phone] ? [phone] ? FAX [phone] °Web site:   [email or web address]   Metformin and X-ray Contrast Studies For some X-ray  exams, a contrast dye is used. Contrast dye is a type of medicine used to make the X-ray image clearer. The contrast dye is given to the patient through a vein (intravenously). If you need to have this type of X-ray exam and you take a medication called metformin, your caregiver may have you stop taking metformin before the exam.  LACTIC ACIDOSIS In rare cases, a serious medical condition called lactic acidosis can develop in people who take metformin and receive contrast dye. The following conditions can increase the risk of this complication:   Kidney failure.  Liver problems.  Certain types of heart problems such as:  Heart failure.  Heart attack.  Heart infection.  Heart valve problems.  Alcohol abuse. If left untreated, lactic acidosis can lead to coma.  SYMPTOMS OF LACTIC ACIDOSIS Symptoms of lactic acidosis can include:  Rapid breathing (hyperventilation).  Neurologic symptoms such as:  Headaches.  Confusion.  Dizziness.  Excessive sweating.  Feeling sick to your stomach (nauseous) or throwing up (vomiting). AFTER THE X-RAY EXAM  Stay well-hydrated. Drink fluids as instructed by your caregiver.  If you have a risk of developing lactic acidosis, blood tests may be done to make sure your kidney function is okay.  Metformin is usually stopped for 48 hours after the X-ray exam. Ask your caregiver when you can start taking metformin again. SEEK MEDICAL CARE IF:   You have shortness of breath or difficulty breathing.  You develop a headache that does not go away.  You have nausea or vomiting.  You urinate more than normal.  You develop a skin rash and have:  Redness.  Swelling.  Itching. Document Released: 04/08/2009 Document Revised: 07/13/2011 Document Reviewed: 04/08/2009 [facility] Patient Information 2015 [place], [place]. This information is not intended to replace advice given to you by your health care provider. Make sure you discuss any questions  you have with your health care provider.

## 2015-02-15 ENCOUNTER — Telehealth: Payer: Self-pay | Admitting: Internal Medicine

## 2015-02-15 ENCOUNTER — Ambulatory Visit (INDEPENDENT_AMBULATORY_CARE_PROVIDER_SITE_OTHER): Payer: Medicare Other | Admitting: *Deleted

## 2015-02-15 ENCOUNTER — Encounter: Payer: Self-pay | Admitting: Internal Medicine

## 2015-02-15 ENCOUNTER — Ambulatory Visit (INDEPENDENT_AMBULATORY_CARE_PROVIDER_SITE_OTHER): Payer: Medicare Other | Admitting: Cardiology

## 2015-02-15 ENCOUNTER — Encounter: Payer: Self-pay | Admitting: Cardiology

## 2015-02-15 VITALS — BP 118/70 | HR 98 | Ht 67.0 in | Wt 191.8 lb

## 2015-02-15 DIAGNOSIS — I10 Essential (primary) hypertension: Secondary | ICD-10-CM

## 2015-02-15 DIAGNOSIS — R55 Syncope and collapse: Secondary | ICD-10-CM

## 2015-02-15 DIAGNOSIS — I495 Sick sinus syndrome: Secondary | ICD-10-CM | POA: Diagnosis not present

## 2015-02-15 DIAGNOSIS — Z950 Presence of cardiac pacemaker: Secondary | ICD-10-CM

## 2015-02-15 LAB — CUP PACEART INCLINIC DEVICE CHECK
Battery Remaining Longevity: 116.4
Battery Voltage: 2.93 V
Brady Statistic RA Percent Paced: 0.09 %
Brady Statistic RV Percent Paced: 0.02 %
Date Time Interrogation Session: 20161014165829
Implantable Lead Implant Date: 20100903
Implantable Lead Implant Date: 20100903
Implantable Lead Location: 753859
Implantable Lead Location: 753860
Lead Channel Impedance Value: 450 Ohm
Lead Channel Impedance Value: 575 Ohm
Lead Channel Pacing Threshold Amplitude: 0.75 V
Lead Channel Pacing Threshold Amplitude: 0.75 V
Lead Channel Pacing Threshold Amplitude: 1 V
Lead Channel Pacing Threshold Amplitude: 1 V
Lead Channel Pacing Threshold Pulse Width: 0.5 ms
Lead Channel Pacing Threshold Pulse Width: 0.5 ms
Lead Channel Pacing Threshold Pulse Width: 0.5 ms
Lead Channel Pacing Threshold Pulse Width: 0.5 ms
Lead Channel Sensing Intrinsic Amplitude: 12 mV
Lead Channel Sensing Intrinsic Amplitude: 5 mV
Lead Channel Setting Pacing Amplitude: 1.125
Lead Channel Setting Pacing Amplitude: 1.5 V
Lead Channel Setting Pacing Pulse Width: 0.5 ms
Lead Channel Setting Sensing Sensitivity: 2 mV
Pulse Gen Model: 2110
Pulse Gen Serial Number: 2312578

## 2015-02-15 LAB — CBC WITH DIFFERENTIAL/PLATELET
Basophils Absolute: 0 10*3/uL (ref 0.0–0.1)
Basophils Relative: 0 % (ref 0–1)
Eosinophils Absolute: 0 10*3/uL (ref 0.0–0.7)
Eosinophils Relative: 0 % (ref 0–5)
HCT: 41.3 % (ref 39.0–52.0)
Hemoglobin: 14.4 g/dL (ref 13.0–17.0)
Lymphocytes Relative: 15 % (ref 12–46)
Lymphs Abs: 1.7 10*3/uL (ref 0.7–4.0)
MCH: 29.9 pg (ref 26.0–34.0)
MCHC: 34.9 g/dL (ref 30.0–36.0)
MCV: 85.7 fL (ref 78.0–100.0)
MPV: 11 fL (ref 8.6–12.4)
Monocytes Absolute: 1 10*3/uL (ref 0.1–1.0)
Monocytes Relative: 9 % (ref 3–12)
Neutro Abs: 8.8 10*3/uL — ABNORMAL HIGH (ref 1.7–7.7)
Neutrophils Relative %: 76 % (ref 43–77)
Platelets: 220 10*3/uL (ref 150–400)
RBC: 4.82 MIL/uL (ref 4.22–5.81)
RDW: 13.1 % (ref 11.5–15.5)
WBC: 11.6 10*3/uL — ABNORMAL HIGH (ref 4.0–10.5)

## 2015-02-15 LAB — BASIC METABOLIC PANEL
BUN: 20 mg/dL (ref 7–25)
CO2: 24 mmol/L (ref 20–31)
Calcium: 10.2 mg/dL (ref 8.6–10.3)
Chloride: 99 mmol/L (ref 98–110)
Creat: 1.11 mg/dL (ref 0.70–1.18)
Glucose, Bld: 74 mg/dL (ref 65–99)
Potassium: 3.7 mmol/L (ref 3.5–5.3)
Sodium: 139 mmol/L (ref 135–146)

## 2015-02-15 NOTE — Progress Notes (Signed)
02/15/2015 Joshua Ali   17-Apr-1941  [id number]  Primary Physician Olin Hauser, MD Primary Cardiologist: Dr. Johnsie Cancel Electrophysiologist: Dr. Caryl Comes  Reason for Visit/CC: Near Syncope  HPI:  The patient is a 74 year old male who presents to clinic as an add on for evaluation of near-syncope. He is followed by Dr. Caryl Comes in the electrophysiology department. He has a history of a St. 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 He had documented sinus arrest of greater than 30 seconds. This was implanted in 2011. He has not required a gen change since then. An echocardiogram at that time revealed normal left ventricular systolic function with an ejection fraction of 60-65%. Since implantation of his pacemaker, he has had no recurrent syncope. His other PMH is significant for DM and HTN, both followed/ managed by his PCP.   He was seen by his PCP yesterday with similar complaints of near syncope. He denies any frank syncope. Per office records faxed to our office, his orthostatic vital signs were negative at the time. His EKG also showed normal sinus rhythm with a heart rate of 68 bpm. Given his symptoms of near syncope, the patient attempted to do a remote pacer transmission from home, however his device was not functioning properly. He now presents to the flex clinic for an office interrogation and for evaluation.  He notes intermittent symptoms over the last 2 days. He has felt dizzy/ lightheaded. He denies CP or dyspnea. Symptoms last ~30 seconds at a time. He dies any numbness, slurred speech, weakness. No melena. He notes good PO intake. No dysuria, fever, chills or n/v/d.   Device interrogation in clinic reveals normal device function. Battery life still with 8-9 years longevity. He has had some SVT since his last interrogation but no arrhthymias that correlate to the timing of his recent symptoms.    Current Outpatient Prescriptions  Medication  Sig Dispense Refill  . amLODipine (NORVASC) 5 MG tablet Take 5 mg by mouth daily.    [name] aspirin 81 MG tablet Take 81 mg by mouth daily.    [name] atorvastatin (LIPITOR) 40 MG tablet Take 40 mg by mouth at bedtime.  3  . calcium carbonate (OS-CAL) 600 MG TABS tablet Take 600 mg by mouth daily with breakfast.    . cetirizine (ZYRTEC) 10 MG tablet Take 10 mg by mouth daily.    . Cholecalciferol (VITAMIN D-3) 1000 UNITS CAPS Take 1 capsule by mouth daily.    . fluticasone (FLONASE) 50 MCG/ACT nasal spray Place 2 sprays into both nostrils as needed for allergies or rhinitis.     [name] glimepiride (AMARYL) 2 MG tablet Take 2 mg by mouth daily.     [name] HYDROcodone-acetaminophen (NORCO/VICODIN) 5-325 MG per tablet Take 1 tablet by mouth every 8 (eight) hours as needed for severe pain. 20 tablet 0  . losartan-hydrochlorothiazide (HYZAAR) 100-25 MG per tablet Take 1 tablet by mouth daily.      . meloxicam (MOBIC) 7.5 MG tablet Take 7.5 mg by mouth daily.    . metFORMIN (GLUCOPHAGE) 1000 MG tablet Take 1,000 mg by mouth 2 (two) times daily with a meal.      . Multiple Vitamin (MULTIVITAMIN WITH MINERALS) TABS tablet Take 1 tablet by mouth daily.     No current facility-administered medications for this visit.    Allergies  Allergen Reactions  . Oxycodone-Acetaminophen Other (See Comments)    Passes out     Social History   Social History  .  Marital Status: Married    Spouse Name: N/A  . Number of Children: N/A  . Years of Education: N/A   Occupational History  . Not on file.   Social History Main Topics  . Smoking status: Former Research scientist (life sciences)  . Smokeless tobacco: Never Used  . Alcohol Use: Yes     Comment: occasional beer  . Drug Use: No  . Sexual Activity: Not on file   Other Topics Concern  . Not on file   Social History Narrative     Review of Systems: General: negative for chills, fever, night sweats or weight changes.  Cardiovascular: negative for chest pain, dyspnea on exertion, edema,  orthopnea, palpitations, paroxysmal nocturnal dyspnea or shortness of breath Dermatological: negative for rash Respiratory: negative for cough or wheezing Urologic: negative for hematuria Abdominal: negative for nausea, vomiting, diarrhea, bright red blood per rectum, melena, or hematemesis Neurologic: negative for visual changes, syncope, + dizziness and near syncope All other systems reviewed and are otherwise negative except as noted above.    Blood pressure 118/70, pulse 98, height 5\' 7"  (1.702 m), weight 191 lb 12.8 oz (87 kg).  General appearance: alert, cooperative and no distress Neck: no carotid bruit and no JVD Lungs: clear to auscultation bilaterally Heart: regular rate and rhythm, S1, S2 normal, no murmur, click, rub or gallop Extremities: no LEE Pulses: 2+ and symmetric Skin: warm and dry Neurologic: Grossly normal  EKG NSR  ASSESSMENT AND PLAN:   1. Near Syncope: PPM device interrogation reveals normal functioning and 8-9 years of battery life remaining. He has had some SVT since his last interrogation but no arrhthymias that correlate to the timing of his recent symptoms.  Orthostatic vitals signs negative. Physical exam negative for carotid bruits and cardiac murmurs. We will obtain basic laboratory work including a BMP and CBC to assess renal function and blood counts. Since he has not had any recent ischemic evaluation in the last 10 years and has risk factors for CAD (HTN, HLD, DM), we will order an exercise Myoview study to rule out possible ischemia. Will also check a 2D echo.   2. PPM: followed by Dr. Caryl Comes. Interrogation reveals normal device function and battery life of 8-9 years remaining.   PLAN  CBC, BMP, NST and echo as outlined above. f/u with Dr. Caryl Comes in 1-2 weeks.   Lyda Jester PA-C 02/15/2015 4:41 PM

## 2015-02-15 NOTE — Telephone Encounter (Signed)
Spoke with patient's wife. Pt has had several near syncopal episodes over the past week, one of which occurred while seeing his PCP yesterday. Per his wife, HR was in the 80s and orthostatic BPs were normal. She says that he can be lying on the couch and feel an episode come on and he will lose color in his face but never lose consciousness. He experienced a total of 7 of these episodes yesterday, none today. They have attempted to send remote transmissions x 2 unsuccessfully. Pt added on to DOD extender and device schedule for this afternoon. Pt and wife agreeable.

## 2015-02-15 NOTE — Progress Notes (Signed)
Pacemaker check in clinic. Normal device function. Thresholds, sensing, impedances consistent with previous measurements. Device programmed to maximize longevity. 41 mode switches- longest 47:27, pk a 187, EGMs are 1:1. 40 high ventricular rates noted- rates 150-160bpm, EGMs 1:1. Device programmed at appropriate safety margins. Histogram distribution appropriate for patient activity level. Device programmed to optimize intrinsic conduction. Estimated longevity 9-9.7 years. Patient enrolled in remote follow-up- working with industry to rectify remote follow up. ROV with SK 02/27/16.

## 2015-02-15 NOTE — Patient Instructions (Signed)
Medication Instructions:  Your physician recommends that you continue on your current medications as directed. Please refer to the Current Medication list given to you today.   Labwork: TODAY:  BMET                CBC W/DIFF  Testing/Procedures: Your physician has requested that you have an echocardiogram. Echocardiography is a painless test that uses sound waves to create images of your heart. It provides your doctor with information about the size and shape of your heart and how well your heart's chambers and valves are working. This procedure takes approximately one hour. There are no restrictions for this procedure.  Your physician has requested that you have en exercise stress myoview. For further information please visit [email or web address]. Please follow instruction sheet, as given.    Follow-Up: Your physician recommends that you schedule a follow-up appointment 1ST AVAILABLE WITH DR. Caryl Comes.   Any Other Special Instructions Will Be Listed Below (If Applicable)..  Echocardiogram An echocardiogram, or echocardiography, uses sound waves (ultrasound) to produce an image of your heart. The echocardiogram is simple, painless, obtained within a short period of time, and offers valuable information to your health care provider. The images from an echocardiogram can provide information such as:  Evidence of coronary artery disease (CAD).  Heart size.  Heart muscle function.  Heart valve function.  Aneurysm detection.  Evidence of a past heart attack.  Fluid buildup around the heart.  Heart muscle thickening.  Assess heart valve function. LET [facility] CARE PROVIDER KNOW ABOUT:  Any allergies you have.  All medicines you are taking, including vitamins, herbs, eye drops, creams, and over-the-counter medicines.  Previous problems you or members of your family have had with the use of anesthetics.  Any blood disorders you have.  Previous surgeries you have  had.  Medical conditions you have.  Possibility of pregnancy, if this applies. BEFORE THE PROCEDURE  No special preparation is needed. Eat and drink normally.  PROCEDURE   In order to produce an image of your heart, gel will be applied to your chest and a wand-like tool (transducer) will be moved over your chest. The gel will help transmit the sound waves from the transducer. The sound waves will harmlessly bounce off your heart to allow the heart images to be captured in real-time motion. These images will then be recorded.  You may need an IV to receive a medicine that improves the quality of the pictures. AFTER THE PROCEDURE You may return to your normal schedule including diet, activities, and medicines, unless your health care provider tells you otherwise.   This information is not intended to replace advice given to you by your health care provider. Make sure you discuss any questions you have with your health care provider.   Document Released: 04/17/2000 Document Revised: 05/11/2014 Document Reviewed: 12/26/2012 Elsevier Interactive Patient Education 2016 [employer].   Exercise Stress Electrocardiogram An exercise stress electrocardiogram is a test that is done to evaluate the blood supply to your heart. This test may also be called exercise stress electrocardiography. The test is done while you are walking on a treadmill. The goal of this test is to raise your heart rate. This test is done to find areas of poor blood flow to the heart by determining the extent of coronary artery disease (CAD).   CAD is defined as narrowing in one or more heart (coronary) arteries of more than 70%. If you have an abnormal test result, this may mean  that you are not getting adequate blood flow to your heart during exercise. Additional testing may be needed to understand why your test was abnormal. LET [facility] CARE PROVIDER KNOW ABOUT:   Any allergies you have.  All medicines you are taking,  including vitamins, herbs, eye drops, creams, and over-the-counter medicines.  Previous problems you or members of your family have had with the use of anesthetics.  Any blood disorders you have.  Previous surgeries you have had.  Medical conditions you have.  Possibility of pregnancy, if this applies. RISKS AND COMPLICATIONS Generally, this is a safe procedure. However, as with any procedure, complications can occur. Possible complications can include:  Pain or pressure in the following areas:  Chest.  Jaw or neck.  Between your shoulder blades.  Radiating down your left arm.  Dizziness or light-headedness.  Shortness of breath.  Increased or irregular heartbeats.  Nausea or vomiting.  Heart attack (rare). BEFORE THE PROCEDURE  Avoid all forms of caffeine 24 hours before your test or as directed by your health care provider. This includes coffee, tea (even decaffeinated tea), caffeinated sodas, chocolate, cocoa, and certain pain medicines.  Follow your health care provider's instructions regarding eating and drinking before the test.  Take your medicines as directed at regular times with water unless instructed otherwise. Exceptions may include:  If you have diabetes, ask how you are to take your insulin or pills. It is common to adjust insulin dosing the morning of the test.  If you are taking beta-blocker medicines, it is important to talk to your health care provider about these medicines well before the date of your test. Taking beta-blocker medicines may interfere with the test. In some cases, these medicines need to be changed or stopped 24 hours or more before the test.  If you wear a nitroglycerin patch, it may need to be removed prior to the test. Ask your health care provider if the patch should be removed before the test.  If you use an inhaler for any breathing condition, bring it with you to the test.  If you are an outpatient, bring a snack so you can  eat right after the stress phase of the test.  Do not smoke for 4 hours prior to the test or as directed by your health care provider.  Do not apply lotions, powders, creams, or oils on your chest prior to the test.  Wear loose-fitting clothes and comfortable shoes for the test. This test involves walking on a treadmill. PROCEDURE  Multiple patches (electrodes) will be put on your chest. If needed, small areas of your chest may have to be shaved to get better contact with the electrodes. Once the electrodes are attached to your body, multiple wires will be attached to the electrodes and your heart rate will be monitored.  Your heart will be monitored both at rest and while exercising.  You will walk on a treadmill. The treadmill will be started at a slow pace. The treadmill speed and incline will gradually be increased to raise your heart rate. AFTER THE PROCEDURE  Your heart rate and blood pressure will be monitored after the test.  You may return to your normal schedule including diet, activities, and medicines, unless your health care provider tells you otherwise.   This information is not intended to replace advice given to you by your health care provider. Make sure you discuss any questions you have with your health care provider.   Document Released: 04/17/2000 Document  Revised: 04/25/2013 Document Reviewed: 12/26/2012 Elsevier Interactive Patient Education [employer].

## 2015-02-15 NOTE — Telephone Encounter (Signed)
New Message      Pt's wife calling stating that pt has had multiple near syncopal episodes and saw his PCP yesterday and was told he needs to see Dr. Caryl Comes as soon as possible. Please call back and advise.

## 2015-02-15 NOTE — Telephone Encounter (Signed)
F/u       Pt's wife calling back to f/u on previous message. Wants to speak to a nurse.

## 2015-02-15 NOTE — Telephone Encounter (Signed)
°  1. Has your device fired?   2. Is you device beeping?   3. Are you experiencing draining or swelling at device site?   4. Are you calling to see if we received your device transmission? Yes  5. Have you passed out?

## 2015-02-15 NOTE — Telephone Encounter (Signed)
Debroah Loop, Device RN spoke with the patient and his wife. The patient has had complaints of pre-syncope for about the last week. He reports that symptoms are similar to prior to his PPM implant. Per Terrence Dupont, the patient is having trouble with this transmitter. No transmission over the last year. Last check was done in office a year ago. The patient saw his PCP yesterday and had a "spell" in the office at that time. Per the patient/ wife's report, his BP's were normal as well as his EKG.  Terrence Dupont offered a follow up with Dr. Caryl Comes on 10/26, but the patient reports he "could be dead by then." Discussed with Ellen Henri, PA- she will see the patient this afternoon. Terrence Dupont will check his device in clinic today. I called Dr. Evette Georges office to have them send a copy of his EKG from yesterday. They will try to locate and send.

## 2015-02-20 ENCOUNTER — Telehealth: Payer: Self-pay | Admitting: *Deleted

## 2015-02-20 NOTE — Telephone Encounter (Signed)
Called Mr. States and let him know about his lab results.  I asked if he had been having any kind of symptoms, fever, chill, n/v/d that could cause for his WBC to be elevated.  Pt declines any symptoms of any kind.  He was advised that I would touch base with Ellen Henri, PA -C to see if she wants to recheck it or just leave it as is.  I will call pt back when I hear back.  Pt verbalized understanding.  Please advise!

## 2015-02-20 NOTE — Telephone Encounter (Signed)
-----   Message from Consuelo Pandy, [place] sent at 02/19/2015  3:11 PM EDT ----- Electrolytes and renal function ok. CBC shows slightly elevated white count. Ask patient if any fever, chills, n/v/d?

## 2015-02-25 ENCOUNTER — Encounter: Payer: Self-pay | Admitting: Internal Medicine

## 2015-02-25 ENCOUNTER — Ambulatory Visit (HOSPITAL_COMMUNITY): Payer: Medicare Other | Attending: Internal Medicine

## 2015-02-25 ENCOUNTER — Other Ambulatory Visit: Payer: Self-pay

## 2015-02-25 ENCOUNTER — Ambulatory Visit (HOSPITAL_BASED_OUTPATIENT_CLINIC_OR_DEPARTMENT_OTHER): Payer: Medicare Other

## 2015-02-25 DIAGNOSIS — I517 Cardiomegaly: Secondary | ICD-10-CM | POA: Diagnosis not present

## 2015-02-25 DIAGNOSIS — Z87891 Personal history of nicotine dependence: Secondary | ICD-10-CM | POA: Insufficient documentation

## 2015-02-25 DIAGNOSIS — R55 Syncope and collapse: Secondary | ICD-10-CM

## 2015-02-25 DIAGNOSIS — E785 Hyperlipidemia, unspecified: Secondary | ICD-10-CM | POA: Insufficient documentation

## 2015-02-25 DIAGNOSIS — I77810 Thoracic aortic ectasia: Secondary | ICD-10-CM | POA: Diagnosis not present

## 2015-02-25 DIAGNOSIS — E669 Obesity, unspecified: Secondary | ICD-10-CM | POA: Diagnosis not present

## 2015-02-25 DIAGNOSIS — Z6829 Body mass index (BMI) 29.0-29.9, adult: Secondary | ICD-10-CM | POA: Insufficient documentation

## 2015-02-25 DIAGNOSIS — I059 Rheumatic mitral valve disease, unspecified: Secondary | ICD-10-CM | POA: Diagnosis not present

## 2015-02-25 DIAGNOSIS — I10 Essential (primary) hypertension: Secondary | ICD-10-CM | POA: Diagnosis not present

## 2015-02-25 DIAGNOSIS — E119 Type 2 diabetes mellitus without complications: Secondary | ICD-10-CM | POA: Insufficient documentation

## 2015-02-25 DIAGNOSIS — I358 Other nonrheumatic aortic valve disorders: Secondary | ICD-10-CM | POA: Diagnosis not present

## 2015-02-25 DIAGNOSIS — I340 Nonrheumatic mitral (valve) insufficiency: Secondary | ICD-10-CM | POA: Diagnosis not present

## 2015-02-25 DIAGNOSIS — I071 Rheumatic tricuspid insufficiency: Secondary | ICD-10-CM | POA: Diagnosis not present

## 2015-02-25 DIAGNOSIS — R9439 Abnormal result of other cardiovascular function study: Secondary | ICD-10-CM | POA: Insufficient documentation

## 2015-02-25 DIAGNOSIS — I351 Nonrheumatic aortic (valve) insufficiency: Secondary | ICD-10-CM | POA: Insufficient documentation

## 2015-02-25 LAB — MYOCARDIAL PERFUSION IMAGING
LV dias vol: 78 mL
LV sys vol: 48 mL
Peak HR: 116 {beats}/min
RATE: 0.31
Rest HR: 91 {beats}/min
SDS: 1
SRS: 5
SSS: 6
TID: 0.93

## 2015-02-25 MED ORDER — REGADENOSON 0.4 MG/5ML IV SOLN
0.4000 mg | Freq: Once | INTRAVENOUS | Status: AC
  Administered 2015-02-25: 0.4 mg via INTRAVENOUS

## 2015-02-25 MED ORDER — TECHNETIUM TC 99M SESTAMIBI GENERIC - CARDIOLITE
10.2000 | Freq: Once | INTRAVENOUS | Status: AC | PRN
  Administered 2015-02-25: 10 via INTRAVENOUS

## 2015-02-25 MED ORDER — TECHNETIUM TC 99M SESTAMIBI GENERIC - CARDIOLITE
30.2000 | Freq: Once | INTRAVENOUS | Status: AC | PRN
  Administered 2015-02-25: 30.2 via INTRAVENOUS

## 2015-02-27 ENCOUNTER — Ambulatory Visit (INDEPENDENT_AMBULATORY_CARE_PROVIDER_SITE_OTHER): Payer: Medicare Other | Admitting: Internal Medicine

## 2015-02-27 ENCOUNTER — Encounter: Payer: Self-pay | Admitting: Internal Medicine

## 2015-02-27 VITALS — BP 128/86 | HR 93 | Ht 67.0 in | Wt 188.6 lb

## 2015-02-27 DIAGNOSIS — I495 Sick sinus syndrome: Secondary | ICD-10-CM

## 2015-02-27 LAB — CUP PACEART INCLINIC DEVICE CHECK
Battery Remaining Longevity: 116.4
Battery Voltage: 2.93 V
Brady Statistic RA Percent Paced: 0.36 %
Brady Statistic RV Percent Paced: 0.02 %
Date Time Interrogation Session: 20161026133011
Implantable Lead Implant Date: 20100903
Implantable Lead Implant Date: 20100903
Implantable Lead Location: 753859
Implantable Lead Location: 753860
Lead Channel Impedance Value: 425 Ohm
Lead Channel Impedance Value: 537.5 Ohm
Lead Channel Pacing Threshold Amplitude: 0.25 V
Lead Channel Pacing Threshold Amplitude: 0.875 V
Lead Channel Pacing Threshold Pulse Width: 0.5 ms
Lead Channel Pacing Threshold Pulse Width: 0.5 ms
Lead Channel Sensing Intrinsic Amplitude: 12 mV
Lead Channel Sensing Intrinsic Amplitude: 5 mV
Lead Channel Setting Pacing Amplitude: 1.125
Lead Channel Setting Pacing Amplitude: 1.25 V
Lead Channel Setting Pacing Pulse Width: 0.5 ms
Lead Channel Setting Sensing Sensitivity: 2 mV
Pulse Gen Model: 2110
Pulse Gen Serial Number: 2312578

## 2015-02-27 NOTE — Patient Instructions (Signed)
Medication Instructions: - no changes  Labwork: - none  Procedures/Testing: - none  Follow-Up: - Remote monitoring is used to monitor your Pacemaker of ICD from home. This monitoring reduces the number of office visits required to check your device to one time per year. It allows [place] to keep an eye on the functioning of your device to ensure it is working properly. You are scheduled for a device check from home on 05/29/15. You may send your transmission at any time that day. If you have a wireless device, the transmission will be sent automatically. After your physician reviews your transmission, you will receive a postcard with your next transmission date.  - Your physician wants you to follow-up in: 1 year with Dr. Caryl Comes. You will receive a reminder letter in the mail two months in advance. If you don't receive a letter, please call our office to schedule the follow-up appointment.  Any Additional Special Instructions Will Be Listed Below (If Applicable).
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
## 2015-05-22 ENCOUNTER — Telehealth: Payer: Self-pay | Admitting: Internal Medicine

## 2015-05-22 NOTE — Telephone Encounter (Signed)
LMOM requesting call back.  Gave device clinic phone number. 

## 2015-05-22 NOTE — Telephone Encounter (Signed)
New Message  Pt wife calling to speak w/ Device concerning upcoming appointments and pt's pacer. Please call back and discuss.

## 2015-05-23 NOTE — Telephone Encounter (Signed)
Follow up ° ° ° ° ° °Returning a call to device clinic °

## 2015-05-24 NOTE — Telephone Encounter (Signed)
Second attempt:  [facility] requesting call back.  Gave device clinic phone number.

## 2015-05-27 NOTE — Telephone Encounter (Signed)
F/u ° ° ° ° ° °Pt's wife calling back. °

## 2015-05-28 NOTE — Telephone Encounter (Signed)
[facility] requesting call back.  Gave device clinic number.

## 2015-05-29 ENCOUNTER — Encounter: Payer: Medicare Other | Admitting: *Deleted

## 2015-05-29 NOTE — Telephone Encounter (Signed)
Spoke w/ pt wife and she informed me that pt can not do remote monitoring b/c pt does not have a home monitor. Instructed pt wife that I would have st jude rep order pt a new monitor and for them to send the transmission once the receive the home monitor. If pt starts to feel bad he is to call the office for an appt. Pt wife verbalized understanding and agreed w/ this plan.

## 2016-04-09 ENCOUNTER — Encounter: Payer: Self-pay | Admitting: Internal Medicine

## 2016-04-09 ENCOUNTER — Ambulatory Visit (INDEPENDENT_AMBULATORY_CARE_PROVIDER_SITE_OTHER): Payer: Medicare Other | Admitting: Internal Medicine

## 2016-04-09 VITALS — BP 130/86 | HR 109 | Ht 67.0 in | Wt 197.2 lb

## 2016-04-09 DIAGNOSIS — I495 Sick sinus syndrome: Secondary | ICD-10-CM

## 2016-04-09 LAB — CUP PACEART INCLINIC DEVICE CHECK
Battery Remaining Longevity: 72 mo
Battery Voltage: 2.89 V
Brady Statistic RA Percent Paced: 0.07 %
Brady Statistic RV Percent Paced: 0.02 %
Date Time Interrogation Session: 20171207165308
Implantable Lead Implant Date: 20100903
Implantable Lead Implant Date: 20100903
Implantable Lead Location: 753859
Implantable Lead Location: 753860
Implantable Pulse Generator Implant Date: 20100903
Lead Channel Impedance Value: 437.5 Ohm
Lead Channel Impedance Value: 587.5 Ohm
Lead Channel Pacing Threshold Amplitude: 0.5 V
Lead Channel Pacing Threshold Amplitude: 1 V
Lead Channel Pacing Threshold Pulse Width: 0.5 ms
Lead Channel Pacing Threshold Pulse Width: 0.5 ms
Lead Channel Sensing Intrinsic Amplitude: 12 mV
Lead Channel Sensing Intrinsic Amplitude: 5 mV
Lead Channel Setting Pacing Amplitude: 1.25 V
Lead Channel Setting Pacing Amplitude: 1.5 V
Lead Channel Setting Pacing Pulse Width: 0.5 ms
Lead Channel Setting Sensing Sensitivity: 2 mV
Pulse Gen Model: 2110
Pulse Gen Serial Number: 2312578

## 2016-04-09 NOTE — Patient Instructions (Signed)
Medication Instructions:    Your physician recommends that you continue on your current medications as directed. Please refer to the Current Medication list given to you today.  --- If you need a refill on your cardiac medications before your next appointment, please call your pharmacy. ---  Labwork:  None ordered  Testing/Procedures:  None ordered  Follow-Up: Remote monitoring is used to monitor your Pacemaker of ICD from home. This monitoring reduces the number of office visits required to check your device to one time per year. It allows [place] to keep an eye on the functioning of your device to ensure it is working properly. You are scheduled for a device check from home on 04/23/16. You may send your transmission at any time that day. If you have a wireless device, the transmission will be sent automatically. After your physician reviews your transmission, you will receive a postcard with your next transmission date.   Your physician wants you to follow-up in: 1 year with Dr. Caryl Comes.  You will receive a reminder letter in the mail two months in advance. If you don't receive a letter, please call our office to schedule the follow-up appointment.   Thank you for choosing [facility]!!
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
## 2016-04-23 ENCOUNTER — Telehealth: Payer: Self-pay | Admitting: Cardiology

## 2016-04-23 ENCOUNTER — Ambulatory Visit (INDEPENDENT_AMBULATORY_CARE_PROVIDER_SITE_OTHER): Payer: Medicare Other | Admitting: *Deleted

## 2016-04-23 DIAGNOSIS — I495 Sick sinus syndrome: Secondary | ICD-10-CM

## 2016-04-23 NOTE — Telephone Encounter (Signed)
Confirmed remote transmission w/ pt wife.   

## 2016-04-24 NOTE — Progress Notes (Signed)
Remote pacemaker transmission.   

## 2016-05-01 LAB — CUP PACEART REMOTE DEVICE CHECK
Date Time Interrogation Session: 20171229113123
Implantable Lead Implant Date: 20100903
Implantable Lead Implant Date: 20100903
Implantable Lead Location: 753859
Implantable Lead Location: 753860
Implantable Pulse Generator Implant Date: 20100903
Lead Channel Setting Pacing Amplitude: 1.25 V
Lead Channel Setting Pacing Amplitude: 1.5 V
Lead Channel Setting Pacing Pulse Width: 0.5 ms
Lead Channel Setting Sensing Sensitivity: 2 mV
Pulse Gen Model: 2110
Pulse Gen Serial Number: 2312578

## 2016-07-24 ENCOUNTER — Encounter: Payer: Medicare Other | Admitting: *Deleted

## 2016-07-24 ENCOUNTER — Telehealth: Payer: Self-pay | Admitting: Cardiology

## 2016-07-24 NOTE — Telephone Encounter (Signed)
LMOVM reminding pt to send remote transmission.   

## 2017-02-06 IMAGING — RF DG ERCP WO/W SPHINCTEROTOMY
1 series · 2 of 2 positions shown · non-contrast
Comparison: None.

CLINICAL DATA: Cholecystectomy

EXAM:
ERCP
TECHNIQUE: Multiple spot images obtained with the fluoroscopic device and
submitted for interpretation post-procedure.
FLUOROSCOPY TIME:  3 minutes and 6 seconds

[Series 1: run · 2 of 2 slices shown]
[im 1/2]
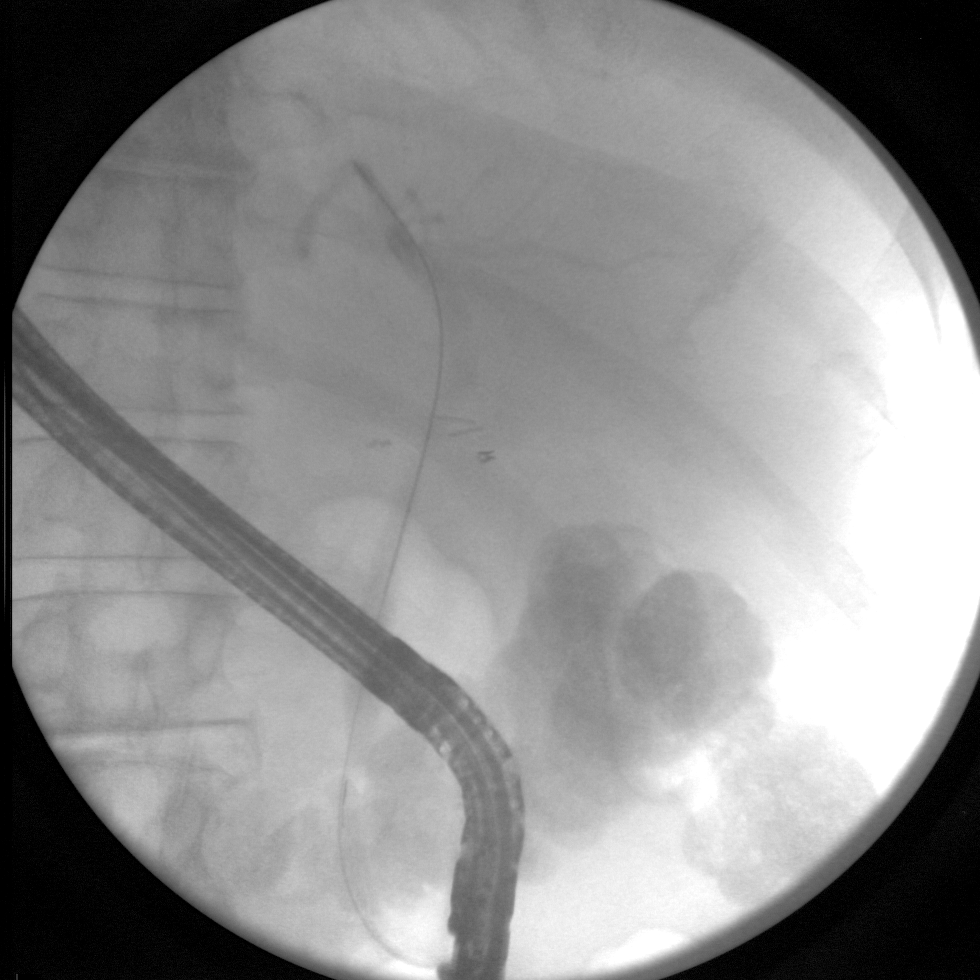
[im 2/2]
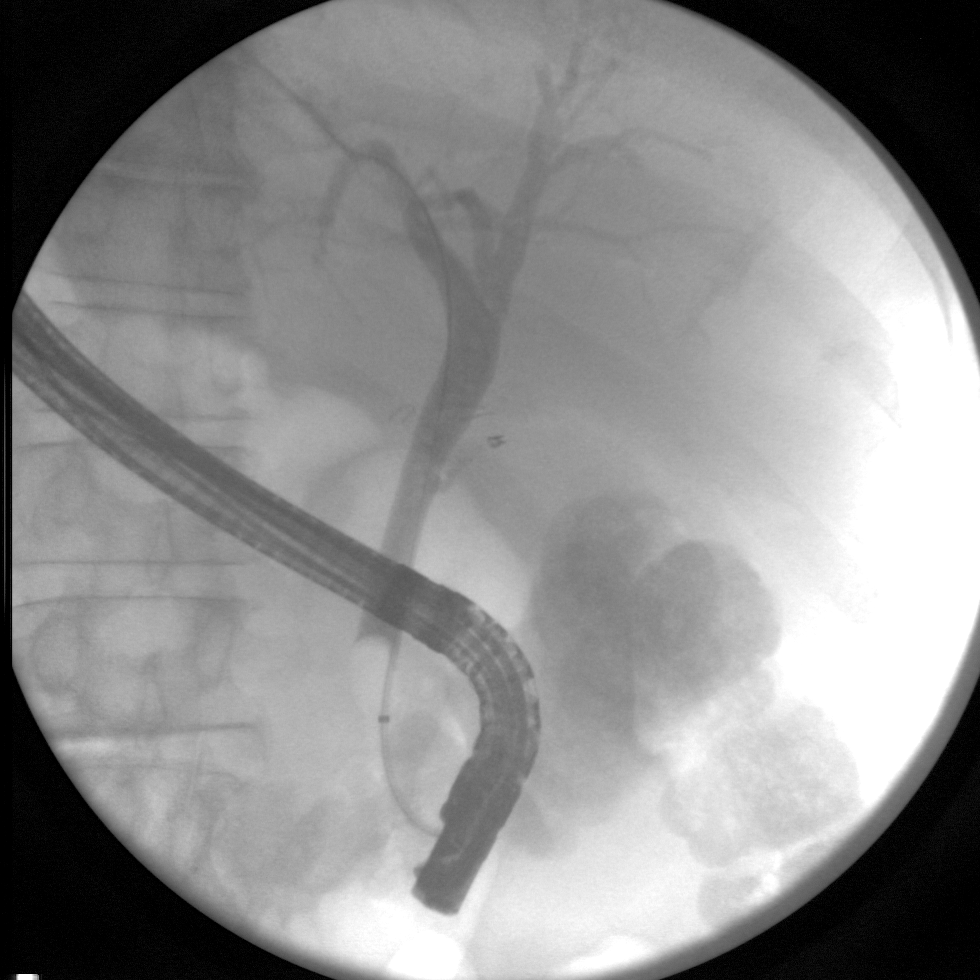

[2 of 2 positions shown; findings below may reference images not displayed]

FINDINGS: Images demonstrate cannulation of the common bile duct and balloon
retraction of stones.
IMPRESSION: ERCP and balloon sweeping of the duct.

These images were submitted for radiologic interpretation only.
Please see the procedural report for the amount of contrast and the
fluoroscopy time utilized.

## 2017-05-05 ENCOUNTER — Ambulatory Visit: Payer: Medicare Other | Admitting: Internal Medicine

## 2017-05-05 ENCOUNTER — Encounter: Payer: Self-pay | Admitting: Internal Medicine

## 2017-05-05 VITALS — BP 150/88 | HR 84 | Ht 67.0 in | Wt 194.0 lb

## 2017-05-05 DIAGNOSIS — Z950 Presence of cardiac pacemaker: Secondary | ICD-10-CM | POA: Diagnosis not present

## 2017-05-05 DIAGNOSIS — I495 Sick sinus syndrome: Secondary | ICD-10-CM

## 2017-05-05 LAB — CUP PACEART INCLINIC DEVICE CHECK
Battery Remaining Longevity: 73 mo
Battery Voltage: 2.87 V
Brady Statistic RA Percent Paced: 0.07 %
Brady Statistic RV Percent Paced: 0.02 %
Date Time Interrogation Session: 20190102142705
Implantable Lead Implant Date: 20100903
Implantable Lead Implant Date: 20100903
Implantable Lead Location: 753859
Implantable Lead Location: 753860
Implantable Pulse Generator Implant Date: 20100903
Lead Channel Impedance Value: 462.5 Ohm
Lead Channel Impedance Value: 612.5 Ohm
Lead Channel Pacing Threshold Amplitude: 0.5 V
Lead Channel Pacing Threshold Amplitude: 0.5 V
Lead Channel Pacing Threshold Amplitude: 1 V
Lead Channel Pacing Threshold Amplitude: 1 V
Lead Channel Pacing Threshold Pulse Width: 0.5 ms
Lead Channel Pacing Threshold Pulse Width: 0.5 ms
Lead Channel Pacing Threshold Pulse Width: 0.5 ms
Lead Channel Pacing Threshold Pulse Width: 0.5 ms
Lead Channel Sensing Intrinsic Amplitude: 12 mV
Lead Channel Sensing Intrinsic Amplitude: 5 mV
Lead Channel Setting Pacing Amplitude: 1.25 V
Lead Channel Setting Pacing Amplitude: 1.5 V
Lead Channel Setting Pacing Pulse Width: 0.5 ms
Lead Channel Setting Sensing Sensitivity: 2 mV
Pulse Gen Model: 2110
Pulse Gen Serial Number: 2312578
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
## 2017-05-05 NOTE — Patient Instructions (Signed)
Medication Instructions: Your physician recommends that you continue on your current medications as directed. Please refer to the Current Medication list given to you today.  Labwork: None Ordered  Procedures/Testing: None Ordered  Follow-Up: Your physician wants you to follow-up in: 1 YEAR with Dr. Caryl Comes. You will receive a reminder letter in the mail two months in advance. If you don't receive a letter, please call our office to schedule the follow-up appointment.  Remote monitoring is used to monitor your Pacemaker from home. This monitoring reduces the number of office visits required to check your device to one time per year. It allows [place] to keep an eye on the functioning of your device to ensure it is working properly. You are scheduled for a device check from home on 08/04/17. You may send your transmission at any time that day. If you have a wireless device, the transmission will be sent automatically. After your physician reviews your transmission, you will receive a postcard with your next transmission date.   If you need a refill on your cardiac medications before your next appointment, please call your pharmacy.

## 2017-07-29 IMAGING — NM NM MISC PROCEDURE
3 series · 18 of 18 positions shown · non-contrast
Comparison: none

[Series 1: stress-sum-em_(id)_sa · 6.4mm · 6.40mm/px · 6 of 64 frames shown]
[frame 6/64]
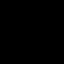
[frame 16/64]
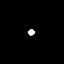
[frame 27/64]
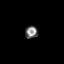
[frame 38/64]
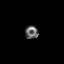
[frame 48/64]
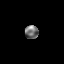
[frame 59/64]
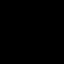

[Series 1: stress-gsp_(id)_sa · 6.4mm · 6.40mm/px · 6 of 512 frames shown]
[frame 43/512]
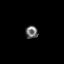
[frame 128/512]
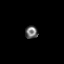
[frame 214/512]
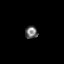
[frame 299/512]
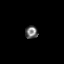
[frame 384/512]
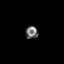
[frame 470/512]
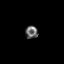

[Series 1: rest_(id)_sa · 6.4mm · 6.40mm/px · 6 of 64 frames shown]
[frame 6/64]
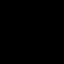
[frame 16/64]
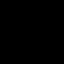
[frame 27/64]
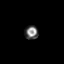
[frame 38/64]
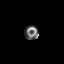
[frame 48/64]
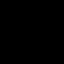
[frame 59/64]
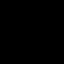

[18 of 18 positions shown; findings below may reference images not displayed]

Canned report from images found in remote index.

Refer to host system for actual result text.

## 2017-08-04 ENCOUNTER — Encounter: Payer: Medicare Other | Admitting: *Deleted

## 2017-08-04 ENCOUNTER — Telehealth: Payer: Self-pay | Admitting: Cardiology

## 2017-08-04 NOTE — Telephone Encounter (Signed)
LMOVM reminding pt to send remote transmission.   

## 2017-08-05 ENCOUNTER — Encounter: Payer: Self-pay | Admitting: Cardiology

## 2017-08-17 ENCOUNTER — Ambulatory Visit (INDEPENDENT_AMBULATORY_CARE_PROVIDER_SITE_OTHER): Payer: Medicare Other | Admitting: *Deleted

## 2017-08-17 DIAGNOSIS — I495 Sick sinus syndrome: Secondary | ICD-10-CM | POA: Diagnosis not present

## 2017-08-17 NOTE — Progress Notes (Signed)
Remote pacemaker transmission.   

## 2017-08-19 ENCOUNTER — Encounter: Payer: Self-pay | Admitting: Cardiology

## 2017-08-24 LAB — CUP PACEART REMOTE DEVICE CHECK
Battery Remaining Longevity: 62 mo
Battery Remaining Percentage: 46 %
Battery Voltage: 2.86 V
Brady Statistic AP VP Percent: 1 %
Brady Statistic AP VS Percent: 1 %
Brady Statistic AS VP Percent: 1 %
Brady Statistic AS VS Percent: 99 %
Brady Statistic RA Percent Paced: 1 %
Brady Statistic RV Percent Paced: 1 %
Date Time Interrogation Session: 20190416142315
Implantable Lead Implant Date: 20100903
Implantable Lead Implant Date: 20100903
Implantable Lead Location: 753859
Implantable Lead Location: 753860
Implantable Pulse Generator Implant Date: 20100903
Lead Channel Impedance Value: 450 Ohm
Lead Channel Impedance Value: 560 Ohm
Lead Channel Pacing Threshold Amplitude: 0.5 V
Lead Channel Pacing Threshold Amplitude: 1 V
Lead Channel Pacing Threshold Pulse Width: 0.5 ms
Lead Channel Pacing Threshold Pulse Width: 0.5 ms
Lead Channel Sensing Intrinsic Amplitude: 12 mV
Lead Channel Sensing Intrinsic Amplitude: 5 mV
Lead Channel Setting Pacing Amplitude: 1.25 V
Lead Channel Setting Pacing Amplitude: 1.5 V
Lead Channel Setting Pacing Pulse Width: 0.5 ms
Lead Channel Setting Sensing Sensitivity: 2 mV
Pulse Gen Model: 2110
Pulse Gen Serial Number: 2312578

## 2017-11-16 ENCOUNTER — Telehealth: Payer: Self-pay

## 2017-11-16 ENCOUNTER — Encounter: Payer: Medicare Other | Admitting: *Deleted

## 2017-11-16 NOTE — Telephone Encounter (Signed)
LMOVM reminding pt to send remote transmission.   

## 2017-11-17 ENCOUNTER — Encounter: Payer: Self-pay | Admitting: Cardiology

## 2017-12-16 ENCOUNTER — Encounter: Payer: Self-pay | Admitting: Cardiology

## 2017-12-20 ENCOUNTER — Telehealth: Payer: Self-pay

## 2017-12-20 NOTE — Telephone Encounter (Signed)
Pt wife called because they was having trouble trouble shooting his monitor. I tried to help but was unsuccessful.  I advised them to call [employer] support.

## 2018-01-13 ENCOUNTER — Ambulatory Visit (INDEPENDENT_AMBULATORY_CARE_PROVIDER_SITE_OTHER): Payer: Medicare Other | Admitting: *Deleted

## 2018-01-13 DIAGNOSIS — I495 Sick sinus syndrome: Secondary | ICD-10-CM | POA: Diagnosis not present

## 2018-01-14 ENCOUNTER — Encounter: Payer: Self-pay | Admitting: Cardiology

## 2018-01-14 NOTE — Progress Notes (Signed)
Remote pacemaker transmission.   

## 2018-02-02 LAB — CUP PACEART REMOTE DEVICE CHECK
Battery Remaining Longevity: 55 mo
Battery Remaining Percentage: 40 %
Battery Voltage: 2.84 V
Brady Statistic AP VP Percent: 1 %
Brady Statistic AP VS Percent: 1 %
Brady Statistic AS VP Percent: 1 %
Brady Statistic AS VS Percent: 99 %
Brady Statistic RA Percent Paced: 1 %
Brady Statistic RV Percent Paced: 1 %
Date Time Interrogation Session: 20190912171036
Implantable Lead Implant Date: 20100903
Implantable Lead Implant Date: 20100903
Implantable Lead Location: 753859
Implantable Lead Location: 753860
Implantable Pulse Generator Implant Date: 20100903
Lead Channel Impedance Value: 460 Ohm
Lead Channel Impedance Value: 580 Ohm
Lead Channel Pacing Threshold Amplitude: 0.5 V
Lead Channel Pacing Threshold Amplitude: 1 V
Lead Channel Pacing Threshold Pulse Width: 0.5 ms
Lead Channel Pacing Threshold Pulse Width: 0.5 ms
Lead Channel Sensing Intrinsic Amplitude: 12 mV
Lead Channel Sensing Intrinsic Amplitude: 5 mV
Lead Channel Setting Pacing Amplitude: 1.25 V
Lead Channel Setting Pacing Amplitude: 1.5 V
Lead Channel Setting Pacing Pulse Width: 0.5 ms
Lead Channel Setting Sensing Sensitivity: 2 mV
Pulse Gen Model: 2110
Pulse Gen Serial Number: 2312578

## 2018-04-15 ENCOUNTER — Telehealth: Payer: Self-pay | Admitting: Cardiology

## 2018-04-15 ENCOUNTER — Ambulatory Visit (INDEPENDENT_AMBULATORY_CARE_PROVIDER_SITE_OTHER): Payer: Medicare Other

## 2018-04-15 DIAGNOSIS — I495 Sick sinus syndrome: Secondary | ICD-10-CM | POA: Diagnosis not present

## 2018-04-15 NOTE — Telephone Encounter (Signed)
Confirmed remote transmission w/ pt wife.   

## 2018-04-18 NOTE — Progress Notes (Signed)
Remote pacemaker transmission.   

## 2018-04-19 ENCOUNTER — Encounter: Payer: Self-pay | Admitting: Cardiology

## 2018-04-19 NOTE — Progress Notes (Signed)
Letter  

## 2018-05-17 ENCOUNTER — Ambulatory Visit: Payer: Medicare Other | Admitting: Internal Medicine

## 2018-05-17 ENCOUNTER — Encounter: Payer: Self-pay | Admitting: Internal Medicine

## 2018-05-17 VITALS — BP 126/86 | HR 103 | Ht 67.0 in | Wt 193.4 lb

## 2018-05-17 DIAGNOSIS — I495 Sick sinus syndrome: Secondary | ICD-10-CM | POA: Diagnosis not present

## 2018-05-17 DIAGNOSIS — Z950 Presence of cardiac pacemaker: Secondary | ICD-10-CM

## 2018-05-17 MED ORDER — DILTIAZEM HCL ER COATED BEADS 120 MG PO CP24: 120.0000 mg | ORAL_CAPSULE | Freq: Every day | ORAL | 3 refills | Status: DC
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
## 2018-05-17 NOTE — Patient Instructions (Signed)
Medication Instructions:  Your physician has recommended you make the following change in your medication:   1. Stop Amlodipine 2. Begin Diltiazem ER, 120mg  tablet, once daily  Labwork: Please have your PCP send [place] your recent BMP and CBC to [phone]  Testing/Procedures: None ordered.  Follow-Up: Your physician recommends that you schedule a follow-up appointment in:   One Year with Dr Caryl Comes  Any Other Special Instructions Will Be Listed Below (If Applicable).     If you need a refill on your cardiac medications before your next appointment, please call your pharmacy.

## 2018-05-18 LAB — CUP PACEART INCLINIC DEVICE CHECK
Date Time Interrogation Session: 20200115083641
Implantable Lead Implant Date: 20100903
Implantable Lead Implant Date: 20100903
Implantable Lead Location: 753859
Implantable Lead Location: 753860
Implantable Pulse Generator Implant Date: 20100903
Pulse Gen Model: 2110
Pulse Gen Serial Number: 2312578

## 2018-05-28 LAB — CUP PACEART REMOTE DEVICE CHECK
Battery Remaining Longevity: 48 mo
Battery Remaining Percentage: 35 %
Battery Voltage: 2.83 V
Brady Statistic AP VP Percent: 1 %
Brady Statistic AP VS Percent: 1 %
Brady Statistic AS VP Percent: 1 %
Brady Statistic AS VS Percent: 99 %
Brady Statistic RA Percent Paced: 1 %
Brady Statistic RV Percent Paced: 1 %
Date Time Interrogation Session: 20191213145756
Implantable Lead Implant Date: 20100903
Implantable Lead Implant Date: 20100903
Implantable Lead Location: 753859
Implantable Lead Location: 753860
Implantable Pulse Generator Implant Date: 20100903
Lead Channel Impedance Value: 460 Ohm
Lead Channel Impedance Value: 610 Ohm
Lead Channel Pacing Threshold Amplitude: 0.5 V
Lead Channel Pacing Threshold Amplitude: 1 V
Lead Channel Pacing Threshold Pulse Width: 0.5 ms
Lead Channel Pacing Threshold Pulse Width: 0.5 ms
Lead Channel Sensing Intrinsic Amplitude: 12 mV
Lead Channel Sensing Intrinsic Amplitude: 5 mV
Lead Channel Setting Pacing Amplitude: 1.25 V
Lead Channel Setting Pacing Amplitude: 1.5 V
Lead Channel Setting Pacing Pulse Width: 0.5 ms
Lead Channel Setting Sensing Sensitivity: 2 mV
Pulse Gen Model: 2110
Pulse Gen Serial Number: 2312578

## 2018-07-18 ENCOUNTER — Other Ambulatory Visit: Payer: Self-pay

## 2018-07-18 ENCOUNTER — Encounter: Payer: Medicare Other | Admitting: *Deleted

## 2018-07-19 ENCOUNTER — Telehealth: Payer: Self-pay

## 2018-07-19 NOTE — Telephone Encounter (Signed)
Left message for patient to remind of missed remote transmission.  

## 2018-07-22 ENCOUNTER — Telehealth: Payer: Self-pay

## 2018-07-22 NOTE — Telephone Encounter (Signed)
Left message for patient to remind of missed remote transmission.  

## 2019-05-18 ENCOUNTER — Encounter: Payer: Medicare Other | Admitting: Internal Medicine

## 2019-05-30 DIAGNOSIS — I471 Supraventricular tachycardia: Secondary | ICD-10-CM | POA: Insufficient documentation

## 2019-05-31 ENCOUNTER — Telehealth: Payer: Self-pay

## 2019-05-31 ENCOUNTER — Other Ambulatory Visit: Payer: Self-pay

## 2019-05-31 ENCOUNTER — Encounter: Payer: Self-pay | Admitting: Internal Medicine

## 2019-05-31 ENCOUNTER — Ambulatory Visit: Payer: Medicare Other | Admitting: Internal Medicine

## 2019-05-31 VITALS — BP 142/94 | HR 96 | Ht 67.0 in | Wt 188.2 lb

## 2019-05-31 DIAGNOSIS — R55 Syncope and collapse: Secondary | ICD-10-CM

## 2019-05-31 DIAGNOSIS — I495 Sick sinus syndrome: Secondary | ICD-10-CM | POA: Diagnosis not present

## 2019-05-31 DIAGNOSIS — E039 Hypothyroidism, unspecified: Secondary | ICD-10-CM

## 2019-05-31 DIAGNOSIS — I471 Supraventricular tachycardia: Secondary | ICD-10-CM | POA: Diagnosis not present

## 2019-05-31 DIAGNOSIS — Z950 Presence of cardiac pacemaker: Secondary | ICD-10-CM

## 2019-05-31 LAB — TSH: TSH: 2.44 u[IU]/mL (ref 0.450–4.500)

## 2019-05-31 LAB — CBC
Hematocrit: 45.7 % (ref 37.5–51.0)
Hemoglobin: 15.8 g/dL (ref 13.0–17.7)
MCH: 30.3 pg (ref 26.6–33.0)
MCHC: 34.6 g/dL (ref 31.5–35.7)
MCV: 88 fL (ref 79–97)
Platelets: 192 10*3/uL (ref 150–450)
RBC: 5.22 x10E6/uL (ref 4.14–5.80)
RDW: 11.8 % (ref 11.6–15.4)
WBC: 6.3 10*3/uL (ref 3.4–10.8)

## 2019-05-31 MED ORDER — DILTIAZEM HCL ER 120 MG PO CP12: 120.0000 mg | ORAL_CAPSULE | Freq: Two times a day (BID) | ORAL | 3 refills | Status: DC
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
## 2019-05-31 NOTE — Patient Instructions (Signed)
Medication Instructions:  Your physician has recommended you make the following change in your medication:   Increase your Diltiazem 120mg  1-tablet twice daily  Labwork: Your physician recommends that you have a CBC and TSH today.     Testing/Procedures: None ordered.  Follow-Up: Your physician wants you to follow-up in: 6 months with Dr Gari Crown will receive a reminder letter in the mail two months in advance. If you don't receive a letter, please call our office to schedule the follow-up appointment.  Remote monitoring is used to monitor your Pacemaker of ICD from home. This monitoring reduces the number of office visits required to check your device to one time per year. It allows [place] to keep an eye on the functioning of your device to ensure it is working properly.  Any Other Special Instructions Will Be Listed Below (If Applicable).  If you need a refill on your cardiac medications before your next appointment, please call your pharmacy.

## 2019-05-31 NOTE — Telephone Encounter (Signed)
I made pharmacy changes requested by patient.  I called patient to verify address.

## 2019-06-02 ENCOUNTER — Telehealth: Payer: Self-pay | Admitting: Internal Medicine

## 2019-06-02 NOTE — Telephone Encounter (Signed)
New Message  Patient is calling in wanting to speak with Dr. Olin Pia nurse. States he has some follow up questions from his appointment. Please give patient a call back to discuss.

## 2019-06-02 NOTE — Telephone Encounter (Signed)
Patient is calling because at his appointment on Wednesday he had discussed with Dr. Caryl Comes his daughter's risk of cardiovascular disease due to family history. At this time Dr. Caryl Comes had recommended that she have a Calcium CT score done. Patient states that her daughter's primary care would not order this and that it had to be ordered by a cardiologist. The patient is wondering if there is a cardiologist in [facility] that Dr. Caryl Comes would recommend. I advised the patient that we do the scans here if she wants to be referred to a provider at our office. He states that if there is no one that Dr. Caryl Comes recommends in [place] that he would get his daughter to come here.

## 2019-06-07 NOTE — Telephone Encounter (Addendum)
Spoke with pt and advised per Dr Caryl Comes, at this time he does not have a recommendation for a cardiologist in the [facility] area for pt's daughter to be evaluated.  Recommends daughter be referred to [facility] for evaluation of hereditary cardiac condition.  Pt verbalizes understanding and agrees with plan.

## 2019-07-03 ENCOUNTER — Telehealth: Payer: Self-pay | Admitting: Internal Medicine

## 2019-07-03 ENCOUNTER — Ambulatory Visit (INDEPENDENT_AMBULATORY_CARE_PROVIDER_SITE_OTHER): Payer: Medicare Other | Admitting: *Deleted

## 2019-07-03 DIAGNOSIS — I495 Sick sinus syndrome: Secondary | ICD-10-CM | POA: Diagnosis not present

## 2019-07-03 LAB — CUP PACEART REMOTE DEVICE CHECK
Battery Remaining Longevity: 23 mo
Battery Remaining Percentage: 17 %
Battery Voltage: 2.75 V
Brady Statistic AP VP Percent: 1 %
Brady Statistic AP VS Percent: 2.6 %
Brady Statistic AS VP Percent: 1 %
Brady Statistic AS VS Percent: 97 %
Brady Statistic RA Percent Paced: 2 %
Brady Statistic RV Percent Paced: 1 %
Date Time Interrogation Session: 20210301133556
Implantable Lead Implant Date: 20100903
Implantable Lead Implant Date: 20100903
Implantable Lead Location: 753859
Implantable Lead Location: 753860
Implantable Pulse Generator Implant Date: 20100903
Lead Channel Impedance Value: 460 Ohm
Lead Channel Impedance Value: 580 Ohm
Lead Channel Pacing Threshold Amplitude: 0.5 V
Lead Channel Pacing Threshold Amplitude: 1.125 V
Lead Channel Pacing Threshold Pulse Width: 0.5 ms
Lead Channel Pacing Threshold Pulse Width: 0.5 ms
Lead Channel Sensing Intrinsic Amplitude: 12 mV
Lead Channel Sensing Intrinsic Amplitude: 5 mV
Lead Channel Setting Pacing Amplitude: 1.375
Lead Channel Setting Pacing Amplitude: 1.5 V
Lead Channel Setting Pacing Pulse Width: 0.5 ms
Lead Channel Setting Sensing Sensitivity: 2 mV
Pulse Gen Model: 2110
Pulse Gen Serial Number: 2312578

## 2019-07-03 LAB — CUP PACEART INCLINIC DEVICE CHECK
Date Time Interrogation Session: 20210127085700
Implantable Lead Implant Date: 20100903
Implantable Lead Implant Date: 20100903
Implantable Lead Location: 753859
Implantable Lead Location: 753860
Implantable Pulse Generator Implant Date: 20100903
Pulse Gen Model: 2110
Pulse Gen Serial Number: 2312578

## 2019-07-03 NOTE — Telephone Encounter (Signed)
After reviewing transmission with Dr. Caryl Comes.  Spoke with pt and his spouse again.  Pt has not had any recent illness, denies diarrhea or nausea that might have caused dehydration.  Dr. Caryl Comes recommended maintaining good hydration, obtaining BP cuff and close monitoring.  Pt and spouse v/u of advised.

## 2019-07-03 NOTE — Telephone Encounter (Signed)
I will send to both Dr. Olin Pia nurse as well as the device clinic for further follow up.

## 2019-07-03 NOTE — Telephone Encounter (Signed)
Spoke with pt and caregiver, assisted in trying to send manual transmission.  Unsuccessful.  They will contact [employer]/ [employer] tech support for assistance now.    Pt reports 2 separate episodes yesterday and 1 episode this AM where he felt like he was going to pass out.  Caregiver observed that pt became pale and slightly diaphoretic.  Pt did not have ability to check BP and did not check blood sugars when this happened.  Symptoms lasted a few minutes and then resolved on own.    Pt reports recent change in meds including Diltiazem which changed from 120mg  XR daily to 120mg  SR BID.  Will watch for manual transmission.

## 2019-07-03 NOTE — Telephone Encounter (Signed)
Manual transmission received.  Episodes do not appear to correlate with changes in HR as there were no episodes yesterday.   Spoke with pt spouse after transmission received, she reports just a few moments prior to my callback pt had another "spell" which would be his 4th in 2 days.  She is afraid to leave him alone to go to get a BP cuff at the store.  She has not checked his BS but says he has just eaten so she didn't feel that was the cause.    In reviewing the transmission, pt thinks that the first "spell" he had today was around the time of AT episode occurring at 1242pm.    Pt report prior to spells he was not doing anything, he could feel them coming on, while they are occurring he is lightheaded.  The spell lasts only about a minute, with the exception on one spell last night that was a couple of minutes.  Spouse described pt condition as pale and diaphoretic.

## 2019-07-03 NOTE — Telephone Encounter (Signed)
STAT if patient feels like he/she is going to faint   1) Are you dizzy now? no  2) Do you feel faint or have you passed out? No  3) Do you have any other symptoms? no  4) Have you checked your HR and BP (record if available)? No  Patient states that he has has 3 different spells that started on Sunday where he feels like he is going to faint. He is not sure if it is his pacemaker or if his medication should be adjusted. Please advise.

## 2019-07-04 NOTE — Progress Notes (Signed)
PPM Remote  

## 2019-09-25 ENCOUNTER — Encounter: Payer: Self-pay | Admitting: Orthopedic Surgery

## 2019-09-25 ENCOUNTER — Other Ambulatory Visit: Payer: Self-pay

## 2019-09-25 ENCOUNTER — Ambulatory Visit: Payer: Medicare Other | Admitting: Orthopedic Surgery

## 2019-09-25 ENCOUNTER — Ambulatory Visit: Payer: Self-pay

## 2019-09-25 VITALS — Ht 67.0 in | Wt 185.0 lb

## 2019-09-25 DIAGNOSIS — M25562 Pain in left knee: Secondary | ICD-10-CM

## 2019-09-25 DIAGNOSIS — M659 Synovitis and tenosynovitis, unspecified: Secondary | ICD-10-CM

## 2019-10-02 ENCOUNTER — Encounter: Payer: Self-pay | Admitting: Orthopedic Surgery

## 2019-10-02 DIAGNOSIS — M659 Synovitis and tenosynovitis, unspecified: Secondary | ICD-10-CM

## 2019-10-02 MED ORDER — BUPIVACAINE HCL 0.25 % IJ SOLN
4.0000 mL | INTRAMUSCULAR | Status: AC | PRN
  Administered 2019-10-02: 4 mL via INTRA_ARTICULAR

## 2019-10-02 MED ORDER — METHYLPREDNISOLONE ACETATE 40 MG/ML IJ SUSP
40.0000 mg | INTRAMUSCULAR | Status: AC | PRN
  Administered 2019-10-02: 40 mg via INTRA_ARTICULAR

## 2019-10-02 MED ORDER — LIDOCAINE HCL 1 % IJ SOLN
5.0000 mL | INTRAMUSCULAR | Status: AC | PRN
  Administered 2019-10-02: 5 mL
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
## 2019-10-04 ENCOUNTER — Ambulatory Visit (INDEPENDENT_AMBULATORY_CARE_PROVIDER_SITE_OTHER): Payer: Medicare Other | Admitting: *Deleted

## 2019-10-04 DIAGNOSIS — I5032 Chronic diastolic (congestive) heart failure: Secondary | ICD-10-CM

## 2019-10-04 DIAGNOSIS — I495 Sick sinus syndrome: Secondary | ICD-10-CM | POA: Diagnosis not present

## 2019-10-04 LAB — CUP PACEART REMOTE DEVICE CHECK
Battery Remaining Longevity: 17 mo
Battery Remaining Percentage: 12 %
Battery Voltage: 2.72 V
Brady Statistic AP VP Percent: 1 %
Brady Statistic AP VS Percent: 2.8 %
Brady Statistic AS VP Percent: 1 %
Brady Statistic AS VS Percent: 96 %
Brady Statistic RA Percent Paced: 2.3 %
Brady Statistic RV Percent Paced: 1 %
Date Time Interrogation Session: 20210602090528
Implantable Lead Implant Date: 20100903
Implantable Lead Implant Date: 20100903
Implantable Lead Location: 753859
Implantable Lead Location: 753860
Implantable Pulse Generator Implant Date: 20100903
Lead Channel Impedance Value: 430 Ohm
Lead Channel Impedance Value: 560 Ohm
Lead Channel Pacing Threshold Amplitude: 0.5 V
Lead Channel Pacing Threshold Amplitude: 1 V
Lead Channel Pacing Threshold Pulse Width: 0.5 ms
Lead Channel Pacing Threshold Pulse Width: 0.5 ms
Lead Channel Sensing Intrinsic Amplitude: 12 mV
Lead Channel Sensing Intrinsic Amplitude: 5 mV
Lead Channel Setting Pacing Amplitude: 1.25 V
Lead Channel Setting Pacing Amplitude: 1.5 V
Lead Channel Setting Pacing Pulse Width: 0.5 ms
Lead Channel Setting Sensing Sensitivity: 2 mV
Pulse Gen Model: 2110
Pulse Gen Serial Number: 2312578

## 2019-10-05 NOTE — Progress Notes (Signed)
Remote pacemaker transmission.   

## 2020-01-03 ENCOUNTER — Ambulatory Visit (INDEPENDENT_AMBULATORY_CARE_PROVIDER_SITE_OTHER): Payer: Medicare Other | Admitting: *Deleted

## 2020-01-03 DIAGNOSIS — I495 Sick sinus syndrome: Secondary | ICD-10-CM

## 2020-01-04 ENCOUNTER — Telehealth: Payer: Self-pay

## 2020-01-04 LAB — CUP PACEART REMOTE DEVICE CHECK
Battery Remaining Longevity: 9 mo
Battery Remaining Percentage: 6 %
Battery Voltage: 2.68 V
Brady Statistic AP VP Percent: 1 %
Brady Statistic AP VS Percent: 2.8 %
Brady Statistic AS VP Percent: 1 %
Brady Statistic AS VS Percent: 96 %
Brady Statistic RA Percent Paced: 2.3 %
Brady Statistic RV Percent Paced: 1 %
Date Time Interrogation Session: 20210901192842
Implantable Lead Implant Date: 20100903
Implantable Lead Implant Date: 20100903
Implantable Lead Location: 753859
Implantable Lead Location: 753860
Implantable Pulse Generator Implant Date: 20100903
Lead Channel Impedance Value: 430 Ohm
Lead Channel Impedance Value: 540 Ohm
Lead Channel Pacing Threshold Amplitude: 0.5 V
Lead Channel Pacing Threshold Amplitude: 1 V
Lead Channel Pacing Threshold Pulse Width: 0.5 ms
Lead Channel Pacing Threshold Pulse Width: 0.5 ms
Lead Channel Sensing Intrinsic Amplitude: 12 mV
Lead Channel Sensing Intrinsic Amplitude: 5 mV
Lead Channel Setting Pacing Amplitude: 1.25 V
Lead Channel Setting Pacing Amplitude: 1.5 V
Lead Channel Setting Pacing Pulse Width: 0.5 ms
Lead Channel Setting Sensing Sensitivity: 2 mV
Pulse Gen Model: 2110
Pulse Gen Serial Number: 2312578

## 2020-01-04 NOTE — Progress Notes (Signed)
Remote pacemaker transmission.   

## 2020-01-04 NOTE — Telephone Encounter (Signed)
The pt called to make sure it is correct for him to send a transmission monthly? I told him now that his battery is at8.9 months we do check it monthly. He asked about his next appointment with Caryl Comes? I told him I do see he was due to see Caryl Comes in July but an appointment was not scheduled. I told him I will have ashland call him at [phone].

## 2020-01-05 ENCOUNTER — Other Ambulatory Visit: Payer: Self-pay

## 2020-01-05 ENCOUNTER — Encounter: Payer: Self-pay | Admitting: Internal Medicine

## 2020-01-05 ENCOUNTER — Ambulatory Visit: Payer: Medicare Other | Admitting: Internal Medicine

## 2020-01-05 VITALS — BP 120/84 | HR 84 | Ht 67.0 in | Wt 186.2 lb

## 2020-01-05 DIAGNOSIS — I495 Sick sinus syndrome: Secondary | ICD-10-CM | POA: Diagnosis not present

## 2020-01-05 LAB — CUP PACEART INCLINIC DEVICE CHECK
Battery Remaining Longevity: 10 mo
Battery Voltage: 2.68 V
Brady Statistic RA Percent Paced: 2.3 %
Brady Statistic RV Percent Paced: 0.8 %
Date Time Interrogation Session: 20210903182549
Implantable Lead Implant Date: 20100903
Implantable Lead Implant Date: 20100903
Implantable Lead Location: 753859
Implantable Lead Location: 753860
Implantable Pulse Generator Implant Date: 20100903
Lead Channel Impedance Value: 462.5 Ohm
Lead Channel Impedance Value: 612.5 Ohm
Lead Channel Pacing Threshold Amplitude: 0.5 V
Lead Channel Pacing Threshold Amplitude: 1.125 V
Lead Channel Pacing Threshold Pulse Width: 0.5 ms
Lead Channel Pacing Threshold Pulse Width: 0.5 ms
Lead Channel Sensing Intrinsic Amplitude: 12 mV
Lead Channel Sensing Intrinsic Amplitude: 5 mV
Lead Channel Setting Pacing Amplitude: 1.375
Lead Channel Setting Pacing Amplitude: 1.5 V
Lead Channel Setting Pacing Pulse Width: 0.5 ms
Lead Channel Setting Sensing Sensitivity: 2 mV
Pulse Gen Model: 2110
Pulse Gen Serial Number: 2312578

## 2020-01-05 NOTE — Patient Instructions (Signed)
Medication Instructions:  Your physician recommends that you continue on your current medications as directed. Please refer to the Current Medication list given to you today.  *If you need a refill on your cardiac medications before your next appointment, please call your pharmacy*   Lab Work: none If you have labs (blood work) drawn today and your tests are completely normal, you will receive your results only by: [name] MyChart Message (if you have MyChart) OR . A paper copy in the mail If you have any lab test that is abnormal or we need to change your treatment, we will call you to review the results.   Testing/Procedures: none   Follow-Up: At [facility], you and your health needs are our priority.  As part of our continuing mission to provide you with exceptional heart care, we have created designated Provider Care Teams.  These Care Teams include your primary Cardiologist (physician) and Advanced Practice Providers (APPs -  Physician Assistants and Nurse Practitioners) who all work together to provide you with the care you need, when you need it.  We recommend signing up for the patient portal called "MyChart".  Sign up information is provided on this After Visit Summary.  MyChart is used to connect with patients for Virtual Visits (Telemedicine).  Patients are able to view lab/test results, encounter notes, upcoming appointments, etc.  Non-urgent messages can be sent to your provider as well.   To learn more about what you can do with MyChart, go to [email or web address].    Your next appointment:   9 month(s)  The format for your next appointment:   In Person  Provider:   Virl Axe, MD   Other Instructions
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
## 2020-03-04 ENCOUNTER — Ambulatory Visit (INDEPENDENT_AMBULATORY_CARE_PROVIDER_SITE_OTHER): Payer: Medicare Other

## 2020-03-04 DIAGNOSIS — I495 Sick sinus syndrome: Secondary | ICD-10-CM

## 2020-03-08 NOTE — Progress Notes (Signed)
Remote pacemaker transmission.   

## 2020-04-03 ENCOUNTER — Ambulatory Visit (INDEPENDENT_AMBULATORY_CARE_PROVIDER_SITE_OTHER): Payer: Medicare Other

## 2020-04-03 DIAGNOSIS — I471 Supraventricular tachycardia: Secondary | ICD-10-CM

## 2020-04-08 LAB — CUP PACEART REMOTE DEVICE CHECK
Battery Remaining Longevity: 5 mo
Battery Remaining Percentage: 3 %
Battery Voltage: 2.65 V
Brady Statistic AP VP Percent: 1 %
Brady Statistic AP VS Percent: 2.6 %
Brady Statistic AS VP Percent: 1 %
Brady Statistic AS VS Percent: 97 %
Brady Statistic RA Percent Paced: 2.2 %
Brady Statistic RV Percent Paced: 1 %
Date Time Interrogation Session: 20211202205911
Implantable Lead Implant Date: 20100903
Implantable Lead Implant Date: 20100903
Implantable Lead Location: 753859
Implantable Lead Location: 753860
Implantable Pulse Generator Implant Date: 20100903
Lead Channel Impedance Value: 410 Ohm
Lead Channel Impedance Value: 560 Ohm
Lead Channel Pacing Threshold Amplitude: 0.5 V
Lead Channel Pacing Threshold Amplitude: 1 V
Lead Channel Pacing Threshold Pulse Width: 0.5 ms
Lead Channel Pacing Threshold Pulse Width: 0.5 ms
Lead Channel Sensing Intrinsic Amplitude: 12 mV
Lead Channel Sensing Intrinsic Amplitude: 5 mV
Lead Channel Setting Pacing Amplitude: 1.25 V
Lead Channel Setting Pacing Amplitude: 1.5 V
Lead Channel Setting Pacing Pulse Width: 0.5 ms
Lead Channel Setting Sensing Sensitivity: 2 mV
Pulse Gen Model: 2110
Pulse Gen Serial Number: 2312578

## 2020-04-09 NOTE — Progress Notes (Signed)
Remote pacemaker transmission.   

## 2020-05-06 ENCOUNTER — Other Ambulatory Visit: Payer: Self-pay | Admitting: *Deleted

## 2020-05-06 ENCOUNTER — Ambulatory Visit (INDEPENDENT_AMBULATORY_CARE_PROVIDER_SITE_OTHER): Payer: Medicare Other

## 2020-05-06 DIAGNOSIS — I471 Supraventricular tachycardia: Secondary | ICD-10-CM

## 2020-05-06 MED ORDER — DILTIAZEM HCL ER 120 MG PO CP12: 120.0000 mg | ORAL_CAPSULE | Freq: Two times a day (BID) | ORAL | 3 refills | Status: AC

## 2020-05-07 LAB — CUP PACEART REMOTE DEVICE CHECK
Battery Remaining Longevity: 1 mo
Battery Remaining Percentage: 1 %
Battery Voltage: 2.63 V
Brady Statistic AP VP Percent: 1 %
Brady Statistic AP VS Percent: 2.4 %
Brady Statistic AS VP Percent: 1 %
Brady Statistic AS VS Percent: 97 %
Brady Statistic RA Percent Paced: 2 %
Brady Statistic RV Percent Paced: 1 %
Date Time Interrogation Session: 20220103225129
Implantable Lead Implant Date: 20100903
Implantable Lead Implant Date: 20100903
Implantable Lead Location: 753859
Implantable Lead Location: 753860
Implantable Pulse Generator Implant Date: 20100903
Lead Channel Impedance Value: 480 Ohm
Lead Channel Impedance Value: 630 Ohm
Lead Channel Pacing Threshold Amplitude: 0.5 V
Lead Channel Pacing Threshold Amplitude: 1 V
Lead Channel Pacing Threshold Pulse Width: 0.5 ms
Lead Channel Pacing Threshold Pulse Width: 0.5 ms
Lead Channel Sensing Intrinsic Amplitude: 12 mV
Lead Channel Sensing Intrinsic Amplitude: 5 mV
Lead Channel Setting Pacing Amplitude: 1.25 V
Lead Channel Setting Pacing Amplitude: 1.5 V
Lead Channel Setting Pacing Pulse Width: 0.5 ms
Lead Channel Setting Sensing Sensitivity: 2 mV
Pulse Gen Model: 2110
Pulse Gen Serial Number: 2312578

## 2020-05-20 NOTE — Progress Notes (Signed)
Remote pacemaker transmission.   

## 2020-06-05 ENCOUNTER — Ambulatory Visit (INDEPENDENT_AMBULATORY_CARE_PROVIDER_SITE_OTHER): Payer: Medicare Other

## 2020-06-05 DIAGNOSIS — I495 Sick sinus syndrome: Secondary | ICD-10-CM

## 2020-06-06 ENCOUNTER — Telehealth: Payer: Self-pay

## 2020-06-06 LAB — CUP PACEART REMOTE DEVICE CHECK
Battery Remaining Longevity: 1 mo
Battery Remaining Percentage: 0.5 %
Battery Voltage: 2.6 V
Brady Statistic AP VP Percent: 1 %
Brady Statistic AP VS Percent: 2.3 %
Brady Statistic AS VP Percent: 1 %
Brady Statistic AS VS Percent: 97 %
Brady Statistic RA Percent Paced: 1.9 %
Brady Statistic RV Percent Paced: 1 %
Date Time Interrogation Session: 20220202190217
Implantable Lead Implant Date: 20100903
Implantable Lead Implant Date: 20100903
Implantable Lead Location: 753859
Implantable Lead Location: 753860
Implantable Pulse Generator Implant Date: 20100903
Lead Channel Impedance Value: 460 Ohm
Lead Channel Impedance Value: 610 Ohm
Lead Channel Pacing Threshold Amplitude: 0.5 V
Lead Channel Pacing Threshold Amplitude: 1 V
Lead Channel Pacing Threshold Pulse Width: 0.5 ms
Lead Channel Pacing Threshold Pulse Width: 0.5 ms
Lead Channel Sensing Intrinsic Amplitude: 12 mV
Lead Channel Sensing Intrinsic Amplitude: 5 mV
Lead Channel Setting Pacing Amplitude: 1.25 V
Lead Channel Setting Pacing Amplitude: 1.5 V
Lead Channel Setting Pacing Pulse Width: 0.5 ms
Lead Channel Setting Sensing Sensitivity: 2 mV
Pulse Gen Model: 2110
Pulse Gen Serial Number: 2312578

## 2020-06-06 NOTE — Telephone Encounter (Signed)
Spoke to patients wife [place], [place]). Advised we will send to scheduler and he will come into office to speak to Dr. Caryl Comes. Verbalized understanding.

## 2020-06-06 NOTE — Telephone Encounter (Signed)
[facility] alert received for ERI at 2.6 V 06/05/20. Called patient to advised need apt. To come into office to discuss gen change. No answer, LMOVM.

## 2020-06-06 NOTE — Telephone Encounter (Signed)
I let the patient wife know per the nurse note that his battery has reached ERI as of 06/05/2020. He needs an appointment with Dr. Caryl Comes to discuss gen change. I let her speak with Leigh, rn.

## 2020-06-10 NOTE — Addendum Note (Signed)
Addended by: Douglass Rivers D on: 06/10/2020 08:40 PM   Modules accepted: Level of Service

## 2020-06-10 NOTE — Progress Notes (Signed)
Remote pacemaker transmission.   
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
## 2020-06-26 ENCOUNTER — Encounter: Payer: Self-pay | Admitting: Physician Assistant

## 2020-06-26 ENCOUNTER — Encounter: Payer: Self-pay | Admitting: *Deleted

## 2020-06-26 ENCOUNTER — Other Ambulatory Visit: Payer: Self-pay

## 2020-06-26 ENCOUNTER — Ambulatory Visit: Payer: Medicare Other | Admitting: Physician Assistant

## 2020-06-26 VITALS — BP 144/84 | HR 88 | Ht 67.0 in | Wt 192.0 lb

## 2020-06-26 DIAGNOSIS — Z950 Presence of cardiac pacemaker: Secondary | ICD-10-CM | POA: Diagnosis not present

## 2020-06-26 DIAGNOSIS — I5032 Chronic diastolic (congestive) heart failure: Secondary | ICD-10-CM

## 2020-06-26 DIAGNOSIS — I471 Supraventricular tachycardia: Secondary | ICD-10-CM

## 2020-06-26 DIAGNOSIS — I10 Essential (primary) hypertension: Secondary | ICD-10-CM | POA: Diagnosis not present

## 2020-06-26 DIAGNOSIS — I472 Ventricular tachycardia: Secondary | ICD-10-CM

## 2020-06-26 DIAGNOSIS — I4729 Other ventricular tachycardia: Secondary | ICD-10-CM

## 2020-06-26 LAB — CUP PACEART INCLINIC DEVICE CHECK
Battery Remaining Longevity: 0 mo
Battery Voltage: 2.6 V
Brady Statistic RA Percent Paced: 1.8 %
Brady Statistic RV Percent Paced: 0.57 %
Date Time Interrogation Session: 20220223161723
Implantable Lead Implant Date: 20100903
Implantable Lead Implant Date: 20100903
Implantable Lead Location: 753859
Implantable Lead Location: 753860
Implantable Pulse Generator Implant Date: 20100903
Lead Channel Impedance Value: 450 Ohm
Lead Channel Impedance Value: 612.5 Ohm
Lead Channel Pacing Threshold Amplitude: 0.5 V
Lead Channel Pacing Threshold Amplitude: 1.125 V
Lead Channel Pacing Threshold Pulse Width: 0.5 ms
Lead Channel Pacing Threshold Pulse Width: 0.5 ms
Lead Channel Sensing Intrinsic Amplitude: 12 mV
Lead Channel Sensing Intrinsic Amplitude: 5 mV
Lead Channel Setting Pacing Amplitude: 1.375
Lead Channel Setting Pacing Amplitude: 1.5 V
Lead Channel Setting Pacing Pulse Width: 0.5 ms
Lead Channel Setting Sensing Sensitivity: 2 mV
Pulse Gen Model: 2110
Pulse Gen Serial Number: 2312578

## 2020-06-26 NOTE — Patient Instructions (Addendum)
Medication Instructions:  MAKE SURE YOU HOLD METFORMIN THE MORNING OF PROCEDURE   *If you need a refill on your cardiac medications before your next appointment, please call your pharmacy*   Lab Work:  CBC  AND BMET  TODAY    COVID SCREENING( DIRECTIONS/MAP GIVEN ) DATE :  07-23-20 TIME: 10.05AM   If you have labs (blood work) drawn today and your tests are completely normal, you will receive your results only by: [name] MyChart Message (if you have MyChart) OR . A paper copy in the mail If you have any lab test that is abnormal or we need to change your treatment, we will call you to review the results.   Testing/Procedures: SEE LETTER FOR GEN CHANGE  07-26-20   AT 9 AM    Follow-Up: At [facility], you and your health needs are our priority.  As part of our continuing mission to provide you with exceptional heart care, we have created designated Provider Care Teams.  These Care Teams include your primary Cardiologist (physician) and Advanced Practice Providers (APPs -  Physician Assistants and Nurse Practitioners) who all work together to provide you with the care you need, when you need it.  We recommend signing up for the patient portal called "MyChart".  Sign up information is provided on this After Visit Summary.  MyChart is used to connect with patients for Virtual Visits (Telemedicine).  Patients are able to view lab/test results, encounter notes, upcoming appointments, etc.  Non-urgent messages can be sent to your provider as well.   To learn more about what you can do with MyChart, go to [email or web address].    Your next appointment:  AFTER  07-26-20    14 DAYS  WITH DEVICE CLINIC WOUND CHECK   3 month(s) IN PERSON You may see Dr. Caryl Comes

## 2020-06-27 LAB — BASIC METABOLIC PANEL
BUN/Creatinine Ratio: 20 (ref 10–24)
BUN: 21 mg/dL (ref 8–27)
CO2: 21 mmol/L (ref 20–29)
Calcium: 9.7 mg/dL (ref 8.6–10.2)
Chloride: 100 mmol/L (ref 96–106)
Creatinine, Ser: 1.06 mg/dL (ref 0.76–1.27)
GFR calc Af Amer: 78 mL/min/{1.73_m2} (ref >59)
GFR calc non Af Amer: 67 mL/min/{1.73_m2} (ref >59)
Glucose: 141 mg/dL — ABNORMAL HIGH (ref 65–99)
Potassium: 4 mmol/L (ref 3.5–5.2)
Sodium: 140 mmol/L (ref 134–144)

## 2020-06-27 LAB — CBC
Hematocrit: 40.4 % (ref 37.5–51.0)
Hemoglobin: 14.4 g/dL (ref 13.0–17.7)
MCH: 30.8 pg (ref 26.6–33.0)
MCHC: 35.6 g/dL (ref 31.5–35.7)
MCV: 86 fL (ref 79–97)
Platelets: 192 10*3/uL (ref 150–450)
RBC: 4.68 x10E6/uL (ref 4.14–5.80)
RDW: 12 % (ref 11.6–15.4)
WBC: 6.8 10*3/uL (ref 3.4–10.8)

## 2020-07-03 ENCOUNTER — Ambulatory Visit (INDEPENDENT_AMBULATORY_CARE_PROVIDER_SITE_OTHER): Payer: Medicare Other

## 2020-07-03 DIAGNOSIS — I495 Sick sinus syndrome: Secondary | ICD-10-CM

## 2020-07-04 NOTE — Telephone Encounter (Signed)
Spoke with patient to remind of missed remote transmission 

## 2020-07-05 LAB — CUP PACEART REMOTE DEVICE CHECK
Battery Remaining Longevity: 1 mo
Battery Remaining Percentage: 0.5 %
Battery Voltage: 2.6 V
Brady Statistic AP VP Percent: 1 %
Brady Statistic AP VS Percent: 1.5 %
Brady Statistic AS VP Percent: 1 %
Brady Statistic AS VS Percent: 98 %
Brady Statistic RA Percent Paced: 1 %
Brady Statistic RV Percent Paced: 1 %
Date Time Interrogation Session: 20220303124942
Implantable Lead Implant Date: 20100903
Implantable Lead Implant Date: 20100903
Implantable Lead Location: 753859
Implantable Lead Location: 753860
Implantable Pulse Generator Implant Date: 20100903
Lead Channel Impedance Value: 450 Ohm
Lead Channel Impedance Value: 560 Ohm
Lead Channel Pacing Threshold Amplitude: 0.5 V
Lead Channel Pacing Threshold Amplitude: 1 V
Lead Channel Pacing Threshold Pulse Width: 0.5 ms
Lead Channel Pacing Threshold Pulse Width: 0.5 ms
Lead Channel Sensing Intrinsic Amplitude: 12 mV
Lead Channel Sensing Intrinsic Amplitude: 5 mV
Lead Channel Setting Pacing Amplitude: 1.25 V
Lead Channel Setting Pacing Amplitude: 1.5 V
Lead Channel Setting Pacing Pulse Width: 0.5 ms
Lead Channel Setting Sensing Sensitivity: 2 mV
Pulse Gen Model: 2110
Pulse Gen Serial Number: 2312578

## 2020-07-11 NOTE — Progress Notes (Signed)
Remote pacemaker transmission.   

## 2020-07-23 ENCOUNTER — Other Ambulatory Visit (HOSPITAL_COMMUNITY)
Admission: RE | Admit: 2020-07-23 | Discharge: 2020-07-23 | Disposition: A | Discharge status: Home / Self Care | Payer: Medicare Other | Source: Ambulatory Visit | Attending: Internal Medicine | Admitting: Internal Medicine

## 2020-07-23 DIAGNOSIS — Z01812 Encounter for preprocedural laboratory examination: Secondary | ICD-10-CM | POA: Insufficient documentation

## 2020-07-23 DIAGNOSIS — Z20822 Contact with and (suspected) exposure to covid-19: Secondary | ICD-10-CM | POA: Insufficient documentation

## 2020-07-23 LAB — SARS CORONAVIRUS 2 (TAT 6-24 HRS): SARS Coronavirus 2: NEGATIVE

## 2020-07-25 ENCOUNTER — Telehealth: Payer: Self-pay

## 2020-07-25 NOTE — Pre-Procedure Instructions (Signed)
Instructed patient on the following items: Arrival time 1200 Nothing to eat or drink after midnight No meds AM of procedure Responsible person to drive you home and stay with you for 24 hrs Wash with special soap night before and morning of procedure  

## 2020-07-25 NOTE — Telephone Encounter (Signed)
Spoke with pt and advised of schedule change for pt's PPM generator change.  Pt will need to arrive at [facility] at 1130 for a 130pm procedure.  Pt verbalizes understanding and agrees with current plan.

## 2020-07-26 ENCOUNTER — Ambulatory Visit (HOSPITAL_COMMUNITY)
Admission: RE | Admit: 2020-07-26 | Discharge: 2020-07-26 | Disposition: A | Discharge status: Home / Self Care | Payer: Medicare Other | Attending: Internal Medicine | Admitting: Internal Medicine

## 2020-07-26 ENCOUNTER — Encounter (HOSPITAL_COMMUNITY)
Admission: RE | Disposition: A | Discharge status: Home / Self Care | Payer: Self-pay | Source: Home / Self Care | Attending: Internal Medicine

## 2020-07-26 ENCOUNTER — Other Ambulatory Visit: Payer: Self-pay

## 2020-07-26 DIAGNOSIS — Z8249 Family history of ischemic heart disease and other diseases of the circulatory system: Secondary | ICD-10-CM | POA: Diagnosis not present

## 2020-07-26 DIAGNOSIS — Z79899 Other long term (current) drug therapy: Secondary | ICD-10-CM | POA: Insufficient documentation

## 2020-07-26 DIAGNOSIS — Z45010 Encounter for checking and testing of cardiac pacemaker pulse generator [battery]: Secondary | ICD-10-CM | POA: Diagnosis not present

## 2020-07-26 DIAGNOSIS — I471 Supraventricular tachycardia: Secondary | ICD-10-CM | POA: Diagnosis not present

## 2020-07-26 DIAGNOSIS — Z96651 Presence of right artificial knee joint: Secondary | ICD-10-CM | POA: Diagnosis not present

## 2020-07-26 DIAGNOSIS — Z85038 Personal history of other malignant neoplasm of large intestine: Secondary | ICD-10-CM | POA: Diagnosis not present

## 2020-07-26 DIAGNOSIS — I10 Essential (primary) hypertension: Secondary | ICD-10-CM | POA: Insufficient documentation

## 2020-07-26 DIAGNOSIS — E119 Type 2 diabetes mellitus without complications: Secondary | ICD-10-CM | POA: Insufficient documentation

## 2020-07-26 DIAGNOSIS — Z933 Colostomy status: Secondary | ICD-10-CM | POA: Diagnosis not present

## 2020-07-26 DIAGNOSIS — Z7982 Long term (current) use of aspirin: Secondary | ICD-10-CM | POA: Insufficient documentation

## 2020-07-26 DIAGNOSIS — Z7984 Long term (current) use of oral hypoglycemic drugs: Secondary | ICD-10-CM | POA: Diagnosis not present

## 2020-07-26 DIAGNOSIS — E785 Hyperlipidemia, unspecified: Secondary | ICD-10-CM | POA: Insufficient documentation

## 2020-07-26 DIAGNOSIS — Z885 Allergy status to narcotic agent status: Secondary | ICD-10-CM | POA: Diagnosis not present

## 2020-07-26 DIAGNOSIS — I495 Sick sinus syndrome: Secondary | ICD-10-CM | POA: Insufficient documentation

## 2020-07-26 DIAGNOSIS — R55 Syncope and collapse: Secondary | ICD-10-CM | POA: Diagnosis present

## 2020-07-26 DIAGNOSIS — Z9049 Acquired absence of other specified parts of digestive tract: Secondary | ICD-10-CM | POA: Insufficient documentation

## 2020-07-26 DIAGNOSIS — Z87891 Personal history of nicotine dependence: Secondary | ICD-10-CM | POA: Diagnosis not present

## 2020-07-26 HISTORY — PX: PPM GENERATOR CHANGEOUT: EP1233

## 2020-07-26 LAB — GLUCOSE, CAPILLARY: Glucose-Capillary: 152 mg/dL — ABNORMAL HIGH (ref 70–99)

## 2020-07-26 SURGERY — PPM GENERATOR CHANGEOUT

## 2020-07-26 MED ORDER — MIDAZOLAM HCL 5 MG/5ML IJ SOLN
INTRAMUSCULAR | Status: DC | PRN
  Administered 2020-07-26: 2 mg via INTRAVENOUS
  Administered 2020-07-26: 1 mg via INTRAVENOUS

## 2020-07-26 MED ORDER — LIDOCAINE HCL (PF) 1 % IJ SOLN
INTRAMUSCULAR | Status: AC
  Filled 2020-07-26: qty 30

## 2020-07-26 MED ORDER — FENTANYL CITRATE (PF) 100 MCG/2ML IJ SOLN
INTRAMUSCULAR | Status: AC
  Filled 2020-07-26: qty 2

## 2020-07-26 MED ORDER — SODIUM CHLORIDE 0.9 % IV SOLN: INTRAVENOUS | Status: DC

## 2020-07-26 MED ORDER — ACETAMINOPHEN 325 MG PO TABS
325.0000 mg | ORAL_TABLET | ORAL | Status: DC | PRN
  Filled 2020-07-26: qty 2

## 2020-07-26 MED ORDER — CEFAZOLIN SODIUM-DEXTROSE 2-4 GM/100ML-% IV SOLN
INTRAVENOUS | Status: AC
  Filled 2020-07-26: qty 100

## 2020-07-26 MED ORDER — FENTANYL CITRATE (PF) 100 MCG/2ML IJ SOLN
INTRAMUSCULAR | Status: DC | PRN
  Administered 2020-07-26 (×2): 25 ug via INTRAVENOUS

## 2020-07-26 MED ORDER — SODIUM CHLORIDE 0.9 % IV SOLN
INTRAVENOUS | Status: AC
  Filled 2020-07-26: qty 2

## 2020-07-26 MED ORDER — MIDAZOLAM HCL 5 MG/5ML IJ SOLN
INTRAMUSCULAR | Status: AC
  Filled 2020-07-26: qty 5

## 2020-07-26 MED ORDER — SODIUM CHLORIDE 0.9 % IV SOLN
80.0000 mg | INTRAVENOUS | Status: AC
  Administered 2020-07-26: 80 mg

## 2020-07-26 MED ORDER — SODIUM CHLORIDE 0.9 % IV SOLN: INTRAVENOUS | Status: AC

## 2020-07-26 MED ORDER — HEPARIN (PORCINE) IN NACL 1000-0.9 UT/500ML-% IV SOLN
INTRAVENOUS | Status: AC
  Filled 2020-07-26: qty 500

## 2020-07-26 MED ORDER — LIDOCAINE HCL (PF) 1 % IJ SOLN
INTRAMUSCULAR | Status: DC | PRN
  Administered 2020-07-26: 45 mL

## 2020-07-26 MED ORDER — CEFAZOLIN SODIUM-DEXTROSE 2-4 GM/100ML-% IV SOLN
2.0000 g | INTRAVENOUS | Status: AC
  Administered 2020-07-26: 2 g via INTRAVENOUS

## 2020-07-26 MED ORDER — CHLORHEXIDINE GLUCONATE 4 % EX LIQD
4.0000 "application " | Freq: Once | CUTANEOUS | Status: DC
  Filled 2020-07-26: qty 60

## 2020-07-26 SURGICAL SUPPLY — 4 items
CABLE SURGICAL S-101-97-12 (CABLE) ×2 IMPLANT
PACEMAKER ASSURITY DR-RF (Pacemaker) ×2 IMPLANT
PAD PRO RADIOLUCENT 2001M-C (PAD) ×2 IMPLANT
TRAY PACEMAKER INSERTION (PACKS) ×2 IMPLANT
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
## 2020-07-29 ENCOUNTER — Encounter (HOSPITAL_COMMUNITY): Payer: Self-pay | Admitting: Internal Medicine

## 2020-08-13 ENCOUNTER — Ambulatory Visit (INDEPENDENT_AMBULATORY_CARE_PROVIDER_SITE_OTHER): Payer: Medicare Other | Admitting: Emergency Medicine

## 2020-08-13 ENCOUNTER — Other Ambulatory Visit: Payer: Self-pay

## 2020-08-13 DIAGNOSIS — I495 Sick sinus syndrome: Secondary | ICD-10-CM

## 2020-08-13 LAB — CUP PACEART INCLINIC DEVICE CHECK
Battery Remaining Longevity: 139 mo
Battery Voltage: 3.08 V
Brady Statistic RA Percent Paced: 1.6 %
Brady Statistic RV Percent Paced: 0.58 %
Date Time Interrogation Session: 20220412142805
Implantable Lead Implant Date: 20100903
Implantable Lead Implant Date: 20100903
Implantable Lead Location: 753859
Implantable Lead Location: 753860
Implantable Pulse Generator Implant Date: 20220325
Lead Channel Impedance Value: 437.5 Ohm
Lead Channel Impedance Value: 612.5 Ohm
Lead Channel Pacing Threshold Amplitude: 0.5 V
Lead Channel Pacing Threshold Amplitude: 1.125 V
Lead Channel Pacing Threshold Pulse Width: 0.4 ms
Lead Channel Pacing Threshold Pulse Width: 0.4 ms
Lead Channel Sensing Intrinsic Amplitude: 12 mV
Lead Channel Sensing Intrinsic Amplitude: 5 mV
Lead Channel Setting Pacing Amplitude: 1.5 V
Lead Channel Setting Pacing Amplitude: 1.5 V
Lead Channel Setting Pacing Pulse Width: 0.4 ms
Lead Channel Setting Sensing Sensitivity: 2 mV
Pulse Gen Model: 2272
Pulse Gen Serial Number: 3910054

## 2020-08-13 NOTE — Progress Notes (Signed)
Wound check appointment. Dermabond removed. Wound without redness or edema. Incision edges approximated, wound well healed. Normal device function. Thresholds, sensing, and impedances consistent with implant measurements. Device outputs programmed appropriate for safety margin on chronic leads. Histogram distribution appropriate for patient and level of activity. No mode switches or high ventricular rates noted. 55 PMT episodes recorded, available EGMs reviewed, it is unclear on how PMT started.  Retrograde conduction test performed, results show Retrograde time of 2103ms.  Reviewed with Dr. Curt Bears DOD.  PVARP extended from 281ms to 362ms.  Patient educated about wound care, arm mobility, lifting restrictions. Patient is enrolled in remote monitoring, next scheduled check 10/29/20.  ROV with Dr. Caryl Comes on 10/29/20.

## 2020-08-14 ENCOUNTER — Telehealth: Payer: Self-pay | Admitting: Cardiology

## 2020-08-14 DIAGNOSIS — R55 Syncope and collapse: Secondary | ICD-10-CM

## 2020-08-14 NOTE — Telephone Encounter (Signed)
Received page from patient's wife.  He states he had an episode earlier in the day where he felt weak and lightheaded, episode quickly resolved.  This evening they had just finished dinner when he had a recurrent episode while sitting at the table.  States he "felt funny".  Wife reports the color drained from his face, and his pupils appeared dilated.  Episode lasted about 30 seconds and he did not lose consciousness.  He was able to get up and walk to the bathroom shortly after with her following.  Symptoms had resolved by the time I returned phone call.  She did not take his blood pressure during this episode.  He is currently asymptomatic at the time of return to call.  I advised given his symptoms had resolved she should check his blood pressure.  Parameters given regarding taking his diltiazem this evening.  ER precautions given for recurrent episodes, low BP, altered mental status, etc  PPM check noted yesterday.  Showing retrograde time of 227ms, which was reviewed with Dr. Curt Bears.  PVARP extended from 261ms to 342ms.   We will route to primary EP cardiologist for review, as well as triage for follow-up device interrogation.   Wife voiced understanding and thanked me for follow-up call.

## 2020-08-15 ENCOUNTER — Encounter (HOSPITAL_BASED_OUTPATIENT_CLINIC_OR_DEPARTMENT_OTHER): Payer: Self-pay

## 2020-08-15 ENCOUNTER — Telehealth: Payer: Self-pay

## 2020-08-15 ENCOUNTER — Emergency Department (HOSPITAL_BASED_OUTPATIENT_CLINIC_OR_DEPARTMENT_OTHER): Payer: Medicare Other

## 2020-08-15 ENCOUNTER — Emergency Department (HOSPITAL_BASED_OUTPATIENT_CLINIC_OR_DEPARTMENT_OTHER)
Admission: EM | Admit: 2020-08-15 | Discharge: 2020-08-15 | Disposition: A | Discharge status: Home / Self Care | Payer: Medicare Other | Attending: Emergency Medicine | Admitting: Emergency Medicine

## 2020-08-15 ENCOUNTER — Other Ambulatory Visit: Payer: Self-pay

## 2020-08-15 DIAGNOSIS — I5032 Chronic diastolic (congestive) heart failure: Secondary | ICD-10-CM | POA: Diagnosis not present

## 2020-08-15 DIAGNOSIS — Z7984 Long term (current) use of oral hypoglycemic drugs: Secondary | ICD-10-CM | POA: Insufficient documentation

## 2020-08-15 DIAGNOSIS — R55 Syncope and collapse: Secondary | ICD-10-CM

## 2020-08-15 DIAGNOSIS — Z950 Presence of cardiac pacemaker: Secondary | ICD-10-CM | POA: Diagnosis not present

## 2020-08-15 DIAGNOSIS — Z87891 Personal history of nicotine dependence: Secondary | ICD-10-CM | POA: Diagnosis not present

## 2020-08-15 DIAGNOSIS — Z79899 Other long term (current) drug therapy: Secondary | ICD-10-CM | POA: Insufficient documentation

## 2020-08-15 DIAGNOSIS — Z7982 Long term (current) use of aspirin: Secondary | ICD-10-CM | POA: Diagnosis not present

## 2020-08-15 DIAGNOSIS — E119 Type 2 diabetes mellitus without complications: Secondary | ICD-10-CM | POA: Insufficient documentation

## 2020-08-15 DIAGNOSIS — T82199A Other mechanical complication of unspecified cardiac device, initial encounter: Secondary | ICD-10-CM | POA: Diagnosis not present

## 2020-08-15 DIAGNOSIS — Y840 Cardiac catheterization as the cause of abnormal reaction of the patient, or of later complication, without mention of misadventure at the time of the procedure: Secondary | ICD-10-CM | POA: Diagnosis not present

## 2020-08-15 DIAGNOSIS — Z85038 Personal history of other malignant neoplasm of large intestine: Secondary | ICD-10-CM | POA: Diagnosis not present

## 2020-08-15 DIAGNOSIS — I110 Hypertensive heart disease with heart failure: Secondary | ICD-10-CM | POA: Insufficient documentation

## 2020-08-15 LAB — BASIC METABOLIC PANEL
Anion gap: 11 (ref 5–15)
BUN: 16 mg/dL (ref 8–23)
CO2: 25 mmol/L (ref 22–32)
Calcium: 8.6 mg/dL — ABNORMAL LOW (ref 8.9–10.3)
Chloride: 97 mmol/L — ABNORMAL LOW (ref 98–111)
Creatinine, Ser: 0.99 mg/dL (ref 0.61–1.24)
GFR, Estimated: >60 mL/min (ref >60)
Glucose, Bld: 224 mg/dL — ABNORMAL HIGH (ref 70–99)
Potassium: 3.8 mmol/L (ref 3.5–5.1)
Sodium: 133 mmol/L — ABNORMAL LOW (ref 135–145)

## 2020-08-15 LAB — CBC
HCT: 38.4 % — ABNORMAL LOW (ref 39.0–52.0)
Hemoglobin: 13.5 g/dL (ref 13.0–17.0)
MCH: 30.8 pg (ref 26.0–34.0)
MCHC: 35.2 g/dL (ref 30.0–36.0)
MCV: 87.7 fL (ref 80.0–100.0)
Platelets: 174 10*3/uL (ref 150–400)
RBC: 4.38 MIL/uL (ref 4.22–5.81)
RDW: 11.8 % (ref 11.5–15.5)
WBC: 6.8 10*3/uL (ref 4.0–10.5)
nRBC: 0 % (ref 0.0–0.2)

## 2020-08-15 LAB — URINALYSIS, ROUTINE W REFLEX MICROSCOPIC
Bilirubin Urine: NEGATIVE
Glucose, UA: NEGATIVE mg/dL
Hgb urine dipstick: NEGATIVE
Ketones, ur: NEGATIVE mg/dL
Leukocytes,Ua: NEGATIVE
Nitrite: NEGATIVE
Protein, ur: NEGATIVE mg/dL
Specific Gravity, Urine: 1.015 (ref 1.005–1.030)
pH: 6.5 (ref 5.0–8.0)

## 2020-08-15 LAB — CBG MONITORING, ED: Glucose-Capillary: 214 mg/dL — ABNORMAL HIGH (ref 70–99)

## 2020-08-15 LAB — TROPONIN I (HIGH SENSITIVITY): Troponin I (High Sensitivity): 2 ng/L (ref <18)

## 2020-08-15 NOTE — ED Notes (Signed)
Pt wife reports that he is having episodes where he feels like he is going to pass out she states that when he is having episodes like this she can tell that his color changes and he becomes pale.
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
## 2020-08-15 NOTE — Telephone Encounter (Signed)
Spoke with pt's wife who reports pt just had another "spell" pt looks like he is going to pass ou againt.  Pt's wife advised to hang up and call 911 now for transport to the ED.  Pt's wife verbalizes understanding and agrees with current plan.

## 2020-08-15 NOTE — ED Triage Notes (Signed)
Near Syncope (Had has several episodes of feeling like he his going to pass out.)  Having pacemaker problems

## 2020-08-15 NOTE — Telephone Encounter (Signed)
Spoke with pt who states he called EMS as recommended due to syncopal episodes.  EMS evaluated and states pt's ECG WNL.  Pt reports his BP was slightly elevated.  Pt refuses to go to ED.  Pt requesting to be seen today by Dr Caryl Comes. Pt advised Dr Caryl Comes is currently seeing pt's in [place] office and to send transmission so device can be reviewed.  Reviewed ED precautions.  Pt verbalizes understanding and agrees with current plan.

## 2020-08-15 NOTE — Telephone Encounter (Signed)
[facility], if we could get a ZIO AT on this guy that would be great

## 2020-08-15 NOTE — ED Notes (Addendum)
Pt wife called out to staff stating he was having a episode, upon entering room pt lethargic and pale with EKG changes, EKG captured and given to provider, provider at bedside, episode lasted less than 1 minute and patient returned to baseline. See EKG, see VS. Pt denies pain during episode. St. Jude interrogator placed on patient.

## 2020-08-15 NOTE — Telephone Encounter (Signed)
Patient's wife called back again with concerns for the husband, she stated that he look like he was going pass out again this morning. Please advise

## 2020-08-15 NOTE — ED Notes (Signed)
ED Provider at bedside. 

## 2020-08-15 NOTE — Addendum Note (Signed)
Addended by: Thora Lance on: 08/15/2020 06:10 PM   Modules accepted: Orders

## 2020-08-15 NOTE — Discharge Instructions (Addendum)
Stop taking losartan-hydrochlorothiazide over the weekend.  You should drink plenty of water to stay well-hydrated and eat a little more salt than usual.  Call your cardiologist to schedule an appointment on Monday.  Do not drive until you are cleared by your physician.  Avoid ladders or other dangerous situations.  Shower only when someone is home.  If these episodes continue to occur over the weekend and you feel uncomfortable, please follow come back to the ED.  Come back if you pass out, have chest pain, shortness of breath, dizziness that is constant, or falls.

## 2020-08-15 NOTE — Telephone Encounter (Signed)
Spoke with patient and patient states he has had several syncopal episodes and has not had any issues until he received his new pacemaker. Patient feels like the settings may need to be adjusted. I advised patient that I would send his concerns to Dr. Caryl Comes and follow up with patient. I also suggested that patient may want to follow up with his PCP for  Evaluation. Patient states he feels ok currently. Device shows normal function as of 08/15/20. Patient made aware no episodes or device issues.

## 2020-08-15 NOTE — Telephone Encounter (Signed)
Attempted phone call to pt.  Left voicemail to contact RN at [phone]. 

## 2020-08-15 NOTE — ED Notes (Signed)
St jude rep called regarding interrogation of pacemaker, reports no evidence of arrhythmia, nothing explaining reason for episode related to pacemaker.  States NSR now.  Provider made aware of call.

## 2020-08-15 NOTE — ED Notes (Signed)
Pt given urinal made aware of need for urine sample

## 2020-08-16 ENCOUNTER — Ambulatory Visit (INDEPENDENT_AMBULATORY_CARE_PROVIDER_SITE_OTHER): Payer: Medicare Other

## 2020-08-16 DIAGNOSIS — R55 Syncope and collapse: Secondary | ICD-10-CM

## 2020-08-16 NOTE — Telephone Encounter (Signed)
Spoke with pt who reports he had 2 additional episodes of syncope without collapse yesterday afternoon.  Pt did go to ED for further evaluation and states hospital was unable to pinpoint reasoning for events even though he had an episode while in the ED. Pt reports he feels well today and has had no additional episodes.  Pt advised Dr Caryl Comes recommends pt wearing a Zio AT - heart monitor for further evaluation.  Pt will be contacted re: monitor. Pt verbalizes understanding and agrees with current plan.

## 2020-08-16 NOTE — Telephone Encounter (Signed)
Patient is returning call.  °

## 2020-08-26 DIAGNOSIS — R55 Syncope and collapse: Secondary | ICD-10-CM

## 2020-08-31 NOTE — Telephone Encounter (Signed)
[facility] Should not be arrhythmic w pacemaker and infrequent V pacing.  If they are occurring weekly recommend a 2 week zio patch, and less frequent than that would be 30d [employer] monitor  Thanks SK

## 2020-10-29 ENCOUNTER — Other Ambulatory Visit: Payer: Self-pay

## 2020-10-29 ENCOUNTER — Ambulatory Visit: Payer: Medicare Other | Admitting: Internal Medicine

## 2020-10-29 ENCOUNTER — Ambulatory Visit (INDEPENDENT_AMBULATORY_CARE_PROVIDER_SITE_OTHER): Payer: Medicare Other

## 2020-10-29 ENCOUNTER — Encounter: Payer: Self-pay | Admitting: Internal Medicine

## 2020-10-29 VITALS — BP 136/78 | HR 96 | Ht 67.0 in | Wt 185.8 lb

## 2020-10-29 DIAGNOSIS — Z950 Presence of cardiac pacemaker: Secondary | ICD-10-CM | POA: Diagnosis not present

## 2020-10-29 DIAGNOSIS — I495 Sick sinus syndrome: Secondary | ICD-10-CM

## 2020-10-29 DIAGNOSIS — R55 Syncope and collapse: Secondary | ICD-10-CM | POA: Diagnosis not present

## 2020-10-29 DIAGNOSIS — I471 Supraventricular tachycardia: Secondary | ICD-10-CM | POA: Diagnosis not present

## 2020-10-29 LAB — CUP PACEART REMOTE DEVICE CHECK
Battery Remaining Longevity: 132 mo
Battery Remaining Percentage: 95.5 %
Battery Voltage: 3.02 V
Brady Statistic AP VP Percent: 1 %
Brady Statistic AP VS Percent: 2.4 %
Brady Statistic AS VP Percent: 1 %
Brady Statistic AS VS Percent: 97 %
Brady Statistic RA Percent Paced: 1.3 %
Brady Statistic RV Percent Paced: 1 %
Date Time Interrogation Session: 20220628034850
Implantable Lead Implant Date: 20100903
Implantable Lead Implant Date: 20100903
Implantable Lead Location: 753859
Implantable Lead Location: 753860
Implantable Pulse Generator Implant Date: 20220325
Lead Channel Impedance Value: 450 Ohm
Lead Channel Impedance Value: 590 Ohm
Lead Channel Pacing Threshold Amplitude: 0.5 V
Lead Channel Pacing Threshold Amplitude: 1.25 V
Lead Channel Pacing Threshold Pulse Width: 0.4 ms
Lead Channel Pacing Threshold Pulse Width: 0.4 ms
Lead Channel Sensing Intrinsic Amplitude: 12 mV
Lead Channel Sensing Intrinsic Amplitude: 5 mV
Lead Channel Setting Pacing Amplitude: 1.5 V
Lead Channel Setting Pacing Amplitude: 1.5 V
Lead Channel Setting Pacing Pulse Width: 0.4 ms
Lead Channel Setting Sensing Sensitivity: 2 mV
Pulse Gen Model: 2272
Pulse Gen Serial Number: 3910054
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
## 2020-10-29 NOTE — Patient Instructions (Signed)
Medication Instructions:  Your physician recommends that you continue on your current medications as directed. Please refer to the Current Medication list given to you today.  *If you need a refill on your cardiac medications before your next appointment, please call your pharmacy*   Lab Work: None ordered.  If you have labs (blood work) drawn today and your tests are completely normal, you will receive your results only by: . MyChart Message (if you have MyChart) OR . A paper copy in the mail If you have any lab test that is abnormal or we need to change your treatment, we will call you to review the results.   Testing/Procedures: None ordered.    Follow-Up: At [facility], you and your health needs are our priority.  As part of our continuing mission to provide you with exceptional heart care, we have created designated Provider Care Teams.  These Care Teams include your primary Cardiologist (physician) and Advanced Practice Providers (APPs -  Physician Assistants and Nurse Practitioners) who all work together to provide you with the care you need, when you need it.  We recommend signing up for the patient portal called "MyChart".  Sign up information is provided on this After Visit Summary.  MyChart is used to connect with patients for Virtual Visits (Telemedicine).  Patients are able to view lab/test results, encounter notes, upcoming appointments, etc.  Non-urgent messages can be sent to your provider as well.   To learn more about what you can do with MyChart, go to [email or web address].    Your next appointment:   9 months  The format for your next appointment:   In Person  Provider:   Steven Klein, MD     
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
## 2020-11-18 NOTE — Progress Notes (Signed)
Remote pacemaker transmission.   

## 2021-01-28 ENCOUNTER — Ambulatory Visit (INDEPENDENT_AMBULATORY_CARE_PROVIDER_SITE_OTHER): Payer: Medicare Other

## 2021-01-28 DIAGNOSIS — I495 Sick sinus syndrome: Secondary | ICD-10-CM | POA: Diagnosis not present

## 2021-01-28 LAB — CUP PACEART REMOTE DEVICE CHECK
Battery Remaining Longevity: 128 mo
Battery Remaining Percentage: 95.5 %
Battery Voltage: 3.02 V
Brady Statistic AP VP Percent: 1 %
Brady Statistic AP VS Percent: 1 %
Brady Statistic AS VP Percent: 1 %
Brady Statistic AS VS Percent: 99 %
Brady Statistic RA Percent Paced: 1 %
Brady Statistic RV Percent Paced: 1 %
Date Time Interrogation Session: 20220927020012
Implantable Lead Implant Date: 20100903
Implantable Lead Implant Date: 20100903
Implantable Lead Location: 753859
Implantable Lead Location: 753860
Implantable Pulse Generator Implant Date: 20220325
Lead Channel Impedance Value: 430 Ohm
Lead Channel Impedance Value: 580 Ohm
Lead Channel Pacing Threshold Amplitude: 0.5 V
Lead Channel Pacing Threshold Amplitude: 1 V
Lead Channel Pacing Threshold Pulse Width: 0.4 ms
Lead Channel Pacing Threshold Pulse Width: 0.4 ms
Lead Channel Sensing Intrinsic Amplitude: 12 mV
Lead Channel Sensing Intrinsic Amplitude: 5 mV
Lead Channel Setting Pacing Amplitude: 1.25 V
Lead Channel Setting Pacing Amplitude: 1.5 V
Lead Channel Setting Pacing Pulse Width: 0.4 ms
Lead Channel Setting Sensing Sensitivity: 2 mV
Pulse Gen Model: 2272
Pulse Gen Serial Number: 3910054

## 2021-02-04 NOTE — Progress Notes (Signed)
Remote pacemaker transmission.   

## 2021-04-21 ENCOUNTER — Encounter: Payer: Self-pay | Admitting: Internal Medicine

## 2021-04-29 ENCOUNTER — Ambulatory Visit (INDEPENDENT_AMBULATORY_CARE_PROVIDER_SITE_OTHER): Payer: Medicare Other

## 2021-04-29 DIAGNOSIS — I495 Sick sinus syndrome: Secondary | ICD-10-CM | POA: Diagnosis not present

## 2021-04-29 LAB — CUP PACEART REMOTE DEVICE CHECK
Battery Remaining Longevity: 125 mo
Battery Remaining Percentage: 95.5 %
Battery Voltage: 3.01 V
Brady Statistic AP VP Percent: 1 %
Brady Statistic AP VS Percent: 1 %
Brady Statistic AS VP Percent: 1 %
Brady Statistic AS VS Percent: 99 %
Brady Statistic RA Percent Paced: 1 %
Brady Statistic RV Percent Paced: 1 %
Date Time Interrogation Session: 20221227020014
Implantable Lead Implant Date: 20100903
Implantable Lead Implant Date: 20100903
Implantable Lead Location: 753859
Implantable Lead Location: 753860
Implantable Pulse Generator Implant Date: 20220325
Lead Channel Impedance Value: 450 Ohm
Lead Channel Impedance Value: 580 Ohm
Lead Channel Pacing Threshold Amplitude: 0.5 V
Lead Channel Pacing Threshold Amplitude: 1.125 V
Lead Channel Pacing Threshold Pulse Width: 0.4 ms
Lead Channel Pacing Threshold Pulse Width: 0.4 ms
Lead Channel Sensing Intrinsic Amplitude: 12 mV
Lead Channel Sensing Intrinsic Amplitude: 5 mV
Lead Channel Setting Pacing Amplitude: 1.375
Lead Channel Setting Pacing Amplitude: 1.5 V
Lead Channel Setting Pacing Pulse Width: 0.4 ms
Lead Channel Setting Sensing Sensitivity: 2 mV
Pulse Gen Model: 2272
Pulse Gen Serial Number: 3910054

## 2021-05-07 NOTE — Progress Notes (Signed)
Remote pacemaker transmission.   

## 2021-07-29 ENCOUNTER — Ambulatory Visit (INDEPENDENT_AMBULATORY_CARE_PROVIDER_SITE_OTHER): Payer: Medicare Other

## 2021-07-29 DIAGNOSIS — I495 Sick sinus syndrome: Secondary | ICD-10-CM | POA: Diagnosis not present

## 2021-07-29 LAB — CUP PACEART REMOTE DEVICE CHECK
Battery Remaining Longevity: 121 mo
Battery Remaining Percentage: 94 %
Battery Voltage: 3.02 V
Brady Statistic AP VP Percent: 1 %
Brady Statistic AP VS Percent: 1 %
Brady Statistic AS VP Percent: 1 %
Brady Statistic AS VS Percent: 99 %
Brady Statistic RA Percent Paced: 1 %
Brady Statistic RV Percent Paced: 1 %
Date Time Interrogation Session: 20230328020030
Implantable Lead Implant Date: 20100903
Implantable Lead Implant Date: 20100903
Implantable Lead Location: 753859
Implantable Lead Location: 753860
Implantable Pulse Generator Implant Date: 20220325
Lead Channel Impedance Value: 410 Ohm
Lead Channel Impedance Value: 580 Ohm
Lead Channel Pacing Threshold Amplitude: 0.625 V
Lead Channel Pacing Threshold Amplitude: 1.125 V
Lead Channel Pacing Threshold Pulse Width: 0.4 ms
Lead Channel Pacing Threshold Pulse Width: 0.4 ms
Lead Channel Sensing Intrinsic Amplitude: 12 mV
Lead Channel Sensing Intrinsic Amplitude: 5 mV
Lead Channel Setting Pacing Amplitude: 1.375
Lead Channel Setting Pacing Amplitude: 1.625
Lead Channel Setting Pacing Pulse Width: 0.4 ms
Lead Channel Setting Sensing Sensitivity: 2 mV
Pulse Gen Model: 2272
Pulse Gen Serial Number: 3910054

## 2021-08-11 NOTE — Progress Notes (Signed)
Remote pacemaker transmission.   

## 2021-10-06 DIAGNOSIS — R000 Tachycardia, unspecified: Secondary | ICD-10-CM | POA: Insufficient documentation

## 2021-10-13 ENCOUNTER — Encounter: Payer: Self-pay | Admitting: Internal Medicine

## 2021-10-13 ENCOUNTER — Ambulatory Visit: Payer: Medicare Other | Admitting: Internal Medicine

## 2021-10-13 VITALS — BP 126/74 | HR 87 | Ht 67.0 in | Wt 183.2 lb

## 2021-10-13 DIAGNOSIS — R55 Syncope and collapse: Secondary | ICD-10-CM

## 2021-10-13 DIAGNOSIS — R000 Tachycardia, unspecified: Secondary | ICD-10-CM | POA: Diagnosis not present

## 2021-10-13 DIAGNOSIS — I495 Sick sinus syndrome: Secondary | ICD-10-CM

## 2021-10-13 DIAGNOSIS — I471 Supraventricular tachycardia: Secondary | ICD-10-CM

## 2021-10-13 DIAGNOSIS — Z950 Presence of cardiac pacemaker: Secondary | ICD-10-CM
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
## 2021-10-13 NOTE — Patient Instructions (Signed)

## 2021-10-28 ENCOUNTER — Ambulatory Visit (INDEPENDENT_AMBULATORY_CARE_PROVIDER_SITE_OTHER): Payer: Medicare Other

## 2021-10-28 DIAGNOSIS — I495 Sick sinus syndrome: Secondary | ICD-10-CM | POA: Diagnosis not present

## 2021-10-28 LAB — CUP PACEART REMOTE DEVICE CHECK
Battery Remaining Longevity: 119 mo
Battery Remaining Percentage: 92 %
Battery Voltage: 3.01 V
Brady Statistic AP VP Percent: 1 %
Brady Statistic AP VS Percent: 1 %
Brady Statistic AS VP Percent: 1 %
Brady Statistic AS VS Percent: 99 %
Brady Statistic RA Percent Paced: 1 %
Brady Statistic RV Percent Paced: 1 %
Date Time Interrogation Session: 20230627020020
Implantable Lead Implant Date: 20100903
Implantable Lead Implant Date: 20100903
Implantable Lead Location: 753859
Implantable Lead Location: 753860
Implantable Pulse Generator Implant Date: 20220325
Lead Channel Impedance Value: 430 Ohm
Lead Channel Impedance Value: 600 Ohm
Lead Channel Pacing Threshold Amplitude: 0.5 V
Lead Channel Pacing Threshold Amplitude: 1.125 V
Lead Channel Pacing Threshold Pulse Width: 0.4 ms
Lead Channel Pacing Threshold Pulse Width: 0.4 ms
Lead Channel Sensing Intrinsic Amplitude: 12 mV
Lead Channel Sensing Intrinsic Amplitude: 5 mV
Lead Channel Setting Pacing Amplitude: 1.375
Lead Channel Setting Pacing Amplitude: 1.5 V
Lead Channel Setting Pacing Pulse Width: 0.4 ms
Lead Channel Setting Sensing Sensitivity: 2 mV
Pulse Gen Model: 2272
Pulse Gen Serial Number: 3910054

## 2021-11-18 NOTE — Progress Notes (Signed)
Remote pacemaker transmission.   

## 2021-12-25 ENCOUNTER — Telehealth: Payer: Self-pay | Admitting: Internal Medicine

## 2021-12-25 NOTE — Telephone Encounter (Signed)
Pt would like for nurse to return call regarding whether or not he should be prescribed Nitroglycerin just to have on hand. Please advise

## 2021-12-25 NOTE — Telephone Encounter (Signed)
Attempted phone call tp pt and left voicemail message to contact office at [phone].

## 2022-01-20 NOTE — Telephone Encounter (Signed)
Spoke with pt's wife, DPR  who was aware of pt's request re: nitro due to having a friend with an ICD being on nitro.  Pt's wife states pt has not had any chest pain nor is aware of any history of heart disease other than pt's rhythm/pacemaker issue.   Advised nitro is usually prescribed for history of CAD or angina which RN cannot find a history of for pt. Reviewed ED precautions for chest pain. Pt's wife verbalized understanding and thanked Therapist, sports for the call.

## 2022-01-27 ENCOUNTER — Ambulatory Visit (INDEPENDENT_AMBULATORY_CARE_PROVIDER_SITE_OTHER): Payer: Medicare Other

## 2022-01-27 DIAGNOSIS — I495 Sick sinus syndrome: Secondary | ICD-10-CM | POA: Diagnosis not present

## 2022-01-28 LAB — CUP PACEART REMOTE DEVICE CHECK
Battery Remaining Longevity: 116 mo
Battery Remaining Percentage: 90 %
Battery Voltage: 3.02 V
Brady Statistic AP VP Percent: 1 %
Brady Statistic AP VS Percent: 1 %
Brady Statistic AS VP Percent: 1 %
Brady Statistic AS VS Percent: 99 %
Brady Statistic RA Percent Paced: 1 %
Brady Statistic RV Percent Paced: 1 %
Date Time Interrogation Session: 20230926020017
Implantable Lead Implant Date: 20100903
Implantable Lead Implant Date: 20100903
Implantable Lead Location: 753859
Implantable Lead Location: 753860
Implantable Pulse Generator Implant Date: 20220325
Lead Channel Impedance Value: 440 Ohm
Lead Channel Impedance Value: 630 Ohm
Lead Channel Pacing Threshold Amplitude: 0.5 V
Lead Channel Pacing Threshold Amplitude: 1.125 V
Lead Channel Pacing Threshold Pulse Width: 0.4 ms
Lead Channel Pacing Threshold Pulse Width: 0.4 ms
Lead Channel Sensing Intrinsic Amplitude: 12 mV
Lead Channel Sensing Intrinsic Amplitude: 5 mV
Lead Channel Setting Pacing Amplitude: 1.375
Lead Channel Setting Pacing Amplitude: 1.5 V
Lead Channel Setting Pacing Pulse Width: 0.4 ms
Lead Channel Setting Sensing Sensitivity: 2 mV
Pulse Gen Model: 2272
Pulse Gen Serial Number: 3910054

## 2022-02-05 NOTE — Progress Notes (Signed)
Remote pacemaker transmission.   

## 2022-04-28 ENCOUNTER — Ambulatory Visit (INDEPENDENT_AMBULATORY_CARE_PROVIDER_SITE_OTHER): Payer: Medicare Other

## 2022-04-28 DIAGNOSIS — I495 Sick sinus syndrome: Secondary | ICD-10-CM

## 2022-04-28 LAB — CUP PACEART REMOTE DEVICE CHECK
Battery Remaining Longevity: 113 mo
Battery Remaining Percentage: 87 %
Battery Voltage: 3.01 V
Brady Statistic AP VP Percent: 1 %
Brady Statistic AP VS Percent: 1 %
Brady Statistic AS VP Percent: 1 %
Brady Statistic AS VS Percent: 99 %
Brady Statistic RA Percent Paced: 1 %
Brady Statistic RV Percent Paced: 1 %
Date Time Interrogation Session: 20231226020020
Implantable Lead Connection Status: 753985
Implantable Lead Connection Status: 753985
Implantable Lead Implant Date: 20100903
Implantable Lead Implant Date: 20100903
Implantable Lead Location: 753859
Implantable Lead Location: 753860
Implantable Pulse Generator Implant Date: 20220325
Lead Channel Impedance Value: 440 Ohm
Lead Channel Impedance Value: 610 Ohm
Lead Channel Pacing Threshold Amplitude: 0.625 V
Lead Channel Pacing Threshold Amplitude: 1.25 V
Lead Channel Pacing Threshold Pulse Width: 0.4 ms
Lead Channel Pacing Threshold Pulse Width: 0.4 ms
Lead Channel Sensing Intrinsic Amplitude: 12 mV
Lead Channel Sensing Intrinsic Amplitude: 5 mV
Lead Channel Setting Pacing Amplitude: 1.5 V
Lead Channel Setting Pacing Amplitude: 1.625
Lead Channel Setting Pacing Pulse Width: 0.4 ms
Lead Channel Setting Sensing Sensitivity: 2 mV
Pulse Gen Model: 2272
Pulse Gen Serial Number: 3910054

## 2022-05-21 NOTE — Progress Notes (Signed)
Remote pacemaker transmission.   

## 2022-07-28 ENCOUNTER — Ambulatory Visit (INDEPENDENT_AMBULATORY_CARE_PROVIDER_SITE_OTHER): Payer: Medicare Other

## 2022-07-28 DIAGNOSIS — I495 Sick sinus syndrome: Secondary | ICD-10-CM

## 2022-07-28 LAB — CUP PACEART REMOTE DEVICE CHECK
Battery Remaining Longevity: 110 mo
Battery Remaining Percentage: 85 %
Battery Voltage: 3.01 V
Brady Statistic AP VP Percent: 1 %
Brady Statistic AP VS Percent: 1 %
Brady Statistic AS VP Percent: 1 %
Brady Statistic AS VS Percent: 99 %
Brady Statistic RA Percent Paced: 1 %
Brady Statistic RV Percent Paced: 1 %
Date Time Interrogation Session: 20240326020013
Implantable Lead Connection Status: 753985
Implantable Lead Connection Status: 753985
Implantable Lead Implant Date: 20100903
Implantable Lead Implant Date: 20100903
Implantable Lead Location: 753859
Implantable Lead Location: 753860
Implantable Pulse Generator Implant Date: 20220325
Lead Channel Impedance Value: 440 Ohm
Lead Channel Impedance Value: 650 Ohm
Lead Channel Pacing Threshold Amplitude: 0.5 V
Lead Channel Pacing Threshold Amplitude: 1.25 V
Lead Channel Pacing Threshold Pulse Width: 0.4 ms
Lead Channel Pacing Threshold Pulse Width: 0.4 ms
Lead Channel Sensing Intrinsic Amplitude: 12 mV
Lead Channel Sensing Intrinsic Amplitude: 5 mV
Lead Channel Setting Pacing Amplitude: 1.5 V
Lead Channel Setting Pacing Amplitude: 1.5 V
Lead Channel Setting Pacing Pulse Width: 0.4 ms
Lead Channel Setting Sensing Sensitivity: 2 mV
Pulse Gen Model: 2272
Pulse Gen Serial Number: 3910054

## 2022-09-08 NOTE — Progress Notes (Signed)
Remote pacemaker transmission.   

## 2022-10-27 ENCOUNTER — Ambulatory Visit: Payer: Medicare Other

## 2022-10-27 DIAGNOSIS — I495 Sick sinus syndrome: Secondary | ICD-10-CM | POA: Diagnosis not present

## 2022-10-28 LAB — CUP PACEART REMOTE DEVICE CHECK
Battery Remaining Longevity: 107 mo
Battery Remaining Percentage: 83 %
Battery Voltage: 3.01 V
Brady Statistic AP VP Percent: 1 %
Brady Statistic AP VS Percent: 1 %
Brady Statistic AS VP Percent: 1 %
Brady Statistic AS VS Percent: 99 %
Brady Statistic RA Percent Paced: 1 %
Brady Statistic RV Percent Paced: 1 %
Date Time Interrogation Session: 20240625020013
Implantable Lead Connection Status: 753985
Implantable Lead Connection Status: 753985
Implantable Lead Implant Date: 20100903
Implantable Lead Implant Date: 20100903
Implantable Lead Location: 753859
Implantable Lead Location: 753860
Implantable Pulse Generator Implant Date: 20220325
Lead Channel Impedance Value: 440 Ohm
Lead Channel Impedance Value: 650 Ohm
Lead Channel Pacing Threshold Amplitude: 0.625 V
Lead Channel Pacing Threshold Amplitude: 1.25 V
Lead Channel Pacing Threshold Pulse Width: 0.4 ms
Lead Channel Pacing Threshold Pulse Width: 0.4 ms
Lead Channel Sensing Intrinsic Amplitude: 12 mV
Lead Channel Sensing Intrinsic Amplitude: 5 mV
Lead Channel Setting Pacing Amplitude: 1.5 V
Lead Channel Setting Pacing Amplitude: 1.625
Lead Channel Setting Pacing Pulse Width: 0.4 ms
Lead Channel Setting Sensing Sensitivity: 2 mV
Pulse Gen Model: 2272
Pulse Gen Serial Number: 3910054

## 2022-10-29 ENCOUNTER — Other Ambulatory Visit (INDEPENDENT_AMBULATORY_CARE_PROVIDER_SITE_OTHER): Payer: Medicare Other

## 2022-10-29 ENCOUNTER — Ambulatory Visit (INDEPENDENT_AMBULATORY_CARE_PROVIDER_SITE_OTHER): Payer: Medicare Other | Admitting: Orthopedic Surgery

## 2022-10-29 ENCOUNTER — Encounter: Payer: Self-pay | Admitting: Orthopedic Surgery

## 2022-10-29 DIAGNOSIS — M79604 Pain in right leg: Secondary | ICD-10-CM
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
## 2022-11-09 ENCOUNTER — Telehealth: Payer: Self-pay

## 2022-11-09 NOTE — Telephone Encounter (Signed)
Patient's wife called stating that patient is having some pain and would like to know if a Rx could be sent to his pharmacy and called concerning appointment for CT scan.  CB# [phone].  Please advise.  Thank you.

## 2022-11-10 ENCOUNTER — Other Ambulatory Visit: Payer: Self-pay | Admitting: Orthopedic Surgery

## 2022-11-10 MED ORDER — OXYCODONE HCL 5 MG PO TABS: 5.0000 mg | ORAL_TABLET | Freq: Four times a day (QID) | ORAL | 0 refills | Status: DC | PRN

## 2022-11-10 NOTE — Telephone Encounter (Signed)
Called and advised wife. Can you advise on status of CT

## 2022-11-10 NOTE — Telephone Encounter (Signed)
What has he had in the past that has worked for him?  Thanks I do not see anything that he has had on review of his records

## 2022-11-10 NOTE — Telephone Encounter (Signed)
Meds sent

## 2022-11-10 NOTE — Telephone Encounter (Signed)
I called and talked to the wife. She stated he is taking Tylenol and ibu and using voltaren gel. As far as she knows he is ok to take most pain medications. He hasn't really taken many that he knows of other than the oxycodone. Please advise

## 2022-11-17 ENCOUNTER — Ambulatory Visit
Admission: RE | Admit: 2022-11-17 | Discharge: 2022-11-17 | Disposition: A | Discharge status: Home / Self Care | Payer: Medicare Other | Source: Ambulatory Visit | Attending: Orthopedic Surgery | Admitting: Orthopedic Surgery

## 2022-11-17 DIAGNOSIS — M79604 Pain in right leg: Secondary | ICD-10-CM

## 2022-11-18 NOTE — Progress Notes (Signed)
 Remote pacemaker transmission.   

## 2022-11-20 ENCOUNTER — Other Ambulatory Visit: Payer: Self-pay | Admitting: Surgical

## 2022-11-20 ENCOUNTER — Telehealth: Payer: Self-pay | Admitting: Radiology

## 2022-11-20 DIAGNOSIS — M79604 Pain in right leg: Secondary | ICD-10-CM

## 2022-11-20 MED ORDER — GABAPENTIN 100 MG PO CAPS
100.0000 mg | ORAL_CAPSULE | Freq: Three times a day (TID) | ORAL | 0 refills | Status: DC
Start: 2022-11-20 — End: 2023-08-30

## 2022-11-20 NOTE — Telephone Encounter (Signed)
Called and discussed with patient's wife. Per Franky Macho, until we see how patient does with medication, do not take them together, but apart by 2 hours. Have also entered ESI. Explained we would work to get insurance authorization and someone would call to schedule injection.

## 2022-11-20 NOTE — Telephone Encounter (Signed)
Pat, patient's wife, called and advised patient is still having lower back pain. Per Dennie Bible, the Oxycodone is not working and would like to know if patient can get something a little stronger for pain?  Please call Dennie Bible to advise.  [phone]  FYI-Patient's CT Lumbar results are final and in chart.  IMPRESSION: Multilevel degenerative disc disease and facet arthropathy, most pronounced at L5-S1 with possible disc herniation with extrusion or sequestered component resulting in severe neural foraminal stenosis on the right. Myelogram may be beneficial for further evaluation.

## 2022-11-20 NOTE — Addendum Note (Signed)
Addended by: Rogers Seeds on: 11/20/2022 04:54 PM   Modules accepted: Orders

## 2022-11-20 NOTE — Telephone Encounter (Signed)
Sent in gabapentin which may help in addition to the oxycodone. Dont want to do any stronger opioids.  Can we set him up to see Dr Alvester Morin for L spine ESI?

## 2022-11-27 ENCOUNTER — Encounter: Payer: Self-pay | Admitting: Orthopedic Surgery

## 2022-11-27 ENCOUNTER — Ambulatory Visit: Payer: Medicare Other | Admitting: Orthopedic Surgery

## 2022-11-27 DIAGNOSIS — M79604 Pain in right leg: Secondary | ICD-10-CM | POA: Diagnosis not present

## 2022-11-27 MED ORDER — OXYCODONE HCL 5 MG PO TABS: 5.0000 mg | ORAL_TABLET | Freq: Three times a day (TID) | ORAL | 0 refills | Status: DC | PRN

## 2022-11-27 MED ORDER — CELECOXIB 100 MG PO CAPS: ORAL_CAPSULE | ORAL | 0 refills | Status: DC

## 2022-11-27 MED ORDER — GABAPENTIN 100 MG PO CAPS: 100.0000 mg | ORAL_CAPSULE | Freq: Three times a day (TID) | ORAL | 0 refills | Status: DC
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
## 2022-12-14 ENCOUNTER — Ambulatory Visit: Payer: Medicare Other | Admitting: Physical Medicine and Rehabilitation

## 2022-12-14 ENCOUNTER — Other Ambulatory Visit: Payer: Self-pay

## 2022-12-14 VITALS — BP 153/76 | HR 102

## 2022-12-14 DIAGNOSIS — M5416 Radiculopathy, lumbar region: Secondary | ICD-10-CM

## 2022-12-14 MED ORDER — METHYLPREDNISOLONE ACETATE 80 MG/ML IJ SUSP
80.0000 mg | Freq: Once | INTRAMUSCULAR | Status: AC
  Administered 2022-12-14: 80 mg

## 2022-12-14 NOTE — Progress Notes (Signed)
Functional Pain Scale - descriptive words and definitions  Unmanageable (7)  Pain interferes with normal ADL's/nothing seems to help/sleep is very difficult/active distractions are very difficult to concentrate on. Severe range order  Average Pain 6-9   +Driver, -BT, -Dye Allergies.  Lower back pain on right side that radiates into the right leg to the ankle. Hard to sleep. Medications are not helping pain

## 2022-12-14 NOTE — Patient Instructions (Signed)

## 2022-12-14 NOTE — Progress Notes (Addendum)
Joshua Ali - 82 y.o. male MRN [id number]  Date of birth: 29-Mar-1941  Office Visit Note: Visit Date: 12/14/2022 PCP: Pearson Forster, MD Referred by: Pearson Forster, MD  Subjective: Chief Complaint  Patient presents with   Lower Back - Pain   HPI:  Joshua Ali is a 82 y.o. male who comes in today at the request of Dr. Burnard Bunting for planned Right L5-S1 Lumbar intralaminar epidural steroid injection with fluoroscopic guidance.  The patient has failed conservative care including home exercise, medications, time and activity modification.  This injection will be diagnostic and hopefully therapeutic.  Please see requesting physician notes for further details and justification.  Disc herniation at L5-S1 with some foraminal and lateral recess narrowing.  Symptoms more classic L5 somewhat S1.  Completed interlaminar injection today to get the patient in quicker when we first saw the referral is for urgent injection.  Could consider transforaminal approach if needed.  I do think this would help him quite a bit hopefully.  He does have follow-up consultation with Dr. Christell Constant in a couple weeks.   ROS Otherwise per HPI.  Assessment & Plan: Visit Diagnoses:    ICD-10-CM   1. Lumbar radiculopathy  M54.16 XR C-ARM NO REPORT    Epidural Steroid injection    methylPREDNISolone acetate (DEPO-MEDROL) injection 80 mg      Plan: No additional findings.   Meds & Orders:  Meds ordered this encounter  Medications   methylPREDNISolone acetate (DEPO-MEDROL) injection 80 mg    Orders Placed This Encounter  Procedures   XR C-ARM NO REPORT   Epidural Steroid injection    Follow-up: Return for visit to requesting provider as needed.   Procedures: No procedures performed  Lumbar Epidural Steroid Injection - Interlaminar Approach with Fluoroscopic Guidance  Patient: Joshua Ali      Date of Birth: 01-Jun-1940 MRN: [id number] PCP: Pearson Forster, MD      Visit Date: 12/14/2022   Universal  Protocol:     Consent Given By: the patient  Position: PRONE  Additional Comments: Vital signs were monitored before and after the procedure. Patient was prepped and draped in the usual sterile fashion. The correct patient, procedure, and site was verified.   Injection Procedure Details:   Procedure diagnoses: Lumbar radiculopathy [M54.16]   Meds Administered:  Meds ordered this encounter  Medications   methylPREDNISolone acetate (DEPO-MEDROL) injection 80 mg     Laterality: Right  Location/Site:  L5-S1  Needle: 3.5 in., 20 ga. Tuohy  Needle Placement: Paramedian epidural  Findings:   -Comments: Excellent flow of contrast into the epidural space.  Procedure Details: Using a paramedian approach from the side mentioned above, the region overlying the inferior lamina was localized under fluoroscopic visualization and the soft tissues overlying this structure were infiltrated with 4 ml. of 1% Lidocaine without Epinephrine. The Tuohy needle was inserted into the epidural space using a paramedian approach.   The epidural space was localized using loss of resistance along with counter oblique bi-planar fluoroscopic views.  After negative aspirate for air, blood, and CSF, a 2 ml. volume of Isovue-250 was injected into the epidural space and the flow of contrast was observed. Radiographs were obtained for documentation purposes.    The injectate was administered into the level noted above.   Additional Comments:  The patient tolerated the procedure well Dressing: 2 x 2 sterile gauze and Band-Aid    Post-procedure details: Patient was observed during the procedure. Post-procedure  instructions were reviewed.  Patient left the clinic in stable condition.   Clinical History: Etta Quill, Claudean Severance, MD - 10/15/2016 Formatting of this note might be different from the original. TECHNIQUE:  Axial noncontrast CT of the lumbar spine with sagittal and coronal reformats. Radiation dose  reduction was utilized (automated exposure control, mA or kV adjustment based on patient size, or iterative image reconstruction). COMPARISON: Radiographs 09/01/2016 INDICATION: Low back pain radiating to left hip and left leg. History of colorectal cancer with surgery, chemotherapy and radiation.  FINDINGS:  No acute fracture. No aggressive appearing osseous lesions. Small sclerotic focus of right L5 with typical appearance for a bone island. Probable small hemangioma of L2. There is reversal of the typical lordosis at the L1-2 level. Trace retrolisthesis of L2 on L3, L3 on L4 and L4 on L5. No paraspinous soft tissue fluid collections. There is ill-defined, partially imaged soft tissue density in the presacral fat with some calcification and apparent surgical hardware related to previous treatment. A small tack-like density is partially embedded in the anterior aspect of S2 sagittal image 37.  Levels: T12-L1: Unremarkable. L1-L2: Disc degenerative change with height loss greatest anteriorly, bulky anterior osteophytes. Minimal posterior disc bulge is suggested. No evidence of high-grade stenosis. L2-L3: Disc degenerative change with height loss greatest anteriorly. Bilateral facet arthrosis. Probable mild canal stenosis and mild-to-moderate foraminal stenosis bilaterally. L3-L4: Disc degenerative change with diffuse height loss. Bilateral facet arthrosis. Probable mild-to-moderate foraminal stenosis bilaterally. L4-L5: Disc degenerative change with diffuse high loss, small posterior bulge and bilateral facet arthrosis. Probable moderate to severe bilateral foraminal stenosis. L5-S1: Disc space height is maintained. There is bilateral facet arthrosis with probable moderate to severe bilateral foraminal stenosis.   IMPRESSION: 1. Multilevel disc degenerative change and facet arthrosis as above with bilateral foraminal stenosis probably mild to moderate at L2-3 and L3-4 and moderate to severe at  L4-5 and L5-S1. 2. Reversal of lordosis with minimal degenerative subluxations. 3. No high-grade canal stenosis is suggested. Exam End: 10/14/16 14:16     Objective:  VS:  HT:    WT:   BMI:     BP:(!) 153/76  HR:(!) 102bpm  TEMP: ( )  RESP:  Physical Exam Vitals and nursing note reviewed.  Constitutional:      General: He is not in acute distress.    Appearance: Normal appearance. He is not ill-appearing.  HENT:     Head: Normocephalic and atraumatic.     Right Ear: External ear normal.     Left Ear: External ear normal.     Nose: No congestion.  Eyes:     Extraocular Movements: Extraocular movements intact.  Cardiovascular:     Rate and Rhythm: Normal rate.     Pulses: Normal pulses.  Pulmonary:     Effort: Pulmonary effort is normal. No respiratory distress.  Abdominal:     General: There is no distension.     Palpations: Abdomen is soft.  Musculoskeletal:        General: No tenderness or signs of injury.     Cervical back: Neck supple.     Right lower leg: No edema.     Left lower leg: No edema.     Comments: Patient has good distal strength without clonus.  Skin:    Findings: No erythema or rash.  Neurological:     General: No focal deficit present.     Mental Status: He is alert and oriented to person, place, and time.  Sensory: No sensory deficit.     Motor: No weakness or abnormal muscle tone.     Coordination: Coordination normal.  Psychiatric:        Mood and Affect: Mood normal.        Behavior: Behavior normal.      Imaging: No results found.

## 2022-12-15 ENCOUNTER — Other Ambulatory Visit: Payer: Self-pay | Admitting: Orthopedic Surgery

## 2022-12-16 ENCOUNTER — Telehealth: Payer: Self-pay | Admitting: Orthopedic Surgery

## 2022-12-16 ENCOUNTER — Other Ambulatory Visit: Payer: Self-pay | Admitting: Surgical

## 2022-12-16 MED ORDER — OXYCODONE HCL 5 MG PO TABS: 5.0000 mg | ORAL_TABLET | Freq: Two times a day (BID) | ORAL | 0 refills | Status: DC | PRN

## 2022-12-16 NOTE — Telephone Encounter (Signed)
Sent in RX for one week of oxycodone to help with post injection pain. No refills after this

## 2022-12-16 NOTE — Telephone Encounter (Signed)
Patient called and need a refill on Oxycodone for his lower back pain. CB#[phone]

## 2022-12-16 NOTE — Telephone Encounter (Signed)
Lvm advising  

## 2022-12-24 ENCOUNTER — Other Ambulatory Visit (INDEPENDENT_AMBULATORY_CARE_PROVIDER_SITE_OTHER): Payer: Medicare Other

## 2022-12-24 ENCOUNTER — Ambulatory Visit: Payer: Medicare Other | Admitting: Orthopedic Surgery

## 2022-12-24 ENCOUNTER — Encounter: Payer: Self-pay | Admitting: Orthopedic Surgery

## 2022-12-24 VITALS — BP 138/88 | HR 93 | Ht 66.0 in | Wt 167.6 lb

## 2022-12-24 DIAGNOSIS — M79604 Pain in right leg: Secondary | ICD-10-CM

## 2022-12-24 DIAGNOSIS — M5416 Radiculopathy, lumbar region: Secondary | ICD-10-CM | POA: Diagnosis not present

## 2022-12-24 NOTE — Progress Notes (Signed)
Orthopedic Spine Surgery Office Note  Assessment: Patient is a 82 y.o. male with low back pain that radiates into the right lower extremity along the lateral aspect of the thigh and leg, suspect radiculopathy   Plan: -Explained that initially conservative treatment is tried as a significant number of patients may experience relief with these treatment modalities. Discussed that the conservative treatments include:  -activity modification  -physical therapy  -over the counter pain medications  -medrol dosepak  -lumbar steroid injections -Patient has tried Tylenol, PT, gabapentin, steroid injection  -Patient got significant relief with last injection, so recommended a repeat to give him more relief -Patient has tried over 6 weeks of conservative treat without any lasting relief, so recommended an MRI of the lumbar spine to evaluate for radiculopathy  -Patient should return to office in 5 weeks, x-rays at next visit: None   Patient expressed understanding of the plan and all questions were answered to the patient's satisfaction.   ___________________________________________________________________________   History:  Patient is a 82 y.o. male who presents today for lumbar spine.  Patient has had over 6 months of low back pain that radiates into the right lower extremity.  He feels it along the lateral aspect of the thigh and leg.  It does not radiate to the foot.  He is not having any pain radiating to the left lower extremity.  There was no trauma or injury that preceded the onset of pain.  Pain has been getting progressively worse with time.  He did get an injection with Dr. Alvester Morin in the past week that he said provided him with 70% relief.  He is interested in doing a repeat injection as he is not looking to do surgery at this time.  He wants to get back to golfing.   Weakness: Yes, both legs feel weaker Symptoms of imbalance: Denies Paresthesias and numbness: Denies Bowel or bladder  incontinence: Has had nocturia recently but no incontinence.  No other changes in bowel or bladder habits. Saddle anesthesia: Denies  Treatments tried: Tylenol, PT, gabapentin, steroid injection  Review of systems: Denies fevers and chills, night sweats, unexplained weight loss. Has a history of colon cancer, has had pain that wakes him at night  Past medical history: Osteoarthritis DM History of colon cancer Sick sinus syndrome HLD Chronic diastolic dysfunction HTN  Allergies: Percocet  Past surgical history:  Cholecystectomy Colostomy Pacemaker insertion Multiple right leg surgeries for fractures Right TKA Cataract surgery  Social history: Denies use of nicotine product (smoking, vaping, patches, smokeless) Alcohol use: Yes, about 2 drinks per week Denies recreational drug use   Physical Exam:  BMI of 27.1  General: no acute distress, appears stated age Neurologic: alert, answering questions appropriately, following commands Respiratory: unlabored breathing on room air, symmetric chest rise Psychiatric: appropriate affect, normal cadence to speech   MSK (spine):  -Strength exam      Left  Right EHL    5/5  4/5 TA    5/5  4+/5 GSC    5/5  5/5 Knee extension  5/5  5/5 Hip flexion   5/5  5/5  -Sensory exam    Sensation intact to light touch in L3-S1 nerve distributions of bilateral lower extremities  -Achilles DTR: 2/4 on the left, 2/4 on the right -Patellar tendon DTR: 2/4 on the left, 2/4 on the right  -Straight leg raise: negative bilaterally -Femoral nerve stretch test: negative bilaterally -Clonus: no beats bilaterally  -Left hip exam: No pain through range of motion,  negative Stinchfield, negative FABER -Right hip exam: No pain through range of motion, negative Stinchfield, negative FABER  Imaging: XRs of the lumbar spine from 12/24/2022 was independently reviewed and interpreted, showing disc height loss at T12/L1, L1/2, L2/3, L3/4, L4/5.   Anterior osteophyte formation seen at L1/2, L2/3, L3/4.  No fracture or dislocation seen.  No evidence of instability on flexion/extension views.  CT of the lumbar spine from 11/17/2022 was independently reviewed and interpreted, showing vacuum disc phenomenon at T12/L1, L1/2, L2/3, L3/4.  Disc height loss from T12/L1 to L4/5.  Anterior osteophyte formation noted particularly at L1/2.  Facet arthropathy worst at L4/5 and L5/S1.  No fracture or dislocation seen.   Patient name: Joshua Ali Patient MRN: [id number] Date of visit: 12/24/22

## 2023-01-04 ENCOUNTER — Other Ambulatory Visit: Payer: Self-pay | Admitting: Surgical

## 2023-01-14 ENCOUNTER — Encounter: Payer: Self-pay | Admitting: Internal Medicine

## 2023-01-14 ENCOUNTER — Ambulatory Visit: Payer: Medicare Other | Attending: Internal Medicine | Admitting: Internal Medicine

## 2023-01-14 VITALS — BP 110/62 | HR 94 | Ht 66.0 in | Wt 167.0 lb

## 2023-01-14 DIAGNOSIS — Z950 Presence of cardiac pacemaker: Secondary | ICD-10-CM | POA: Diagnosis not present

## 2023-01-14 DIAGNOSIS — R55 Syncope and collapse: Secondary | ICD-10-CM

## 2023-01-14 DIAGNOSIS — I495 Sick sinus syndrome: Secondary | ICD-10-CM

## 2023-01-14 DIAGNOSIS — I4719 Other supraventricular tachycardia: Secondary | ICD-10-CM | POA: Diagnosis not present
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
## 2023-01-14 NOTE — Patient Instructions (Signed)
Medication Instructions:  Your physician recommends that you continue on your current medications as directed. Please refer to the Current Medication list given to you today.  *If you need a refill on your cardiac medications before your next appointment, please call your pharmacy*   Lab Work: None ordered.  If you have labs (blood work) drawn today and your tests are completely normal, you will receive your results only by: MyChart Message (if you have MyChart) OR A paper copy in the mail If you have any lab test that is abnormal or we need to change your treatment, we will call you to review the results.   Testing/Procedures: None ordered.    Follow-Up: At [facility], you and your health needs are our priority.  As part of our continuing mission to provide you with exceptional heart care, we have created designated Provider Care Teams.  These Care Teams include your primary Cardiologist (physician) and Advanced Practice Providers (APPs -  Physician Assistants and Nurse Practitioners) who all work together to provide you with the care you need, when you need it.  We recommend signing up for the patient portal called "MyChart".  Sign up information is provided on this After Visit Summary.  MyChart is used to connect with patients for Virtual Visits (Telemedicine).  Patients are able to view lab/test results, encounter notes, upcoming appointments, etc.  Non-urgent messages can be sent to your provider as well.   To learn more about what you can do with MyChart, go to [email or web address].    Your next appointment:   Nurse visit for BP check - please bring your BP cuff with you to this visit to check against our readings.

## 2023-01-20 ENCOUNTER — Ambulatory Visit: Payer: Medicare Other | Attending: Interventional Cardiology

## 2023-01-20 VITALS — BP 124/62 | HR 100 | Ht 66.0 in | Wt 167.0 lb

## 2023-01-20 DIAGNOSIS — I10 Essential (primary) hypertension: Secondary | ICD-10-CM

## 2023-01-20 NOTE — Progress Notes (Unsigned)
Nurse Visit   Date of Encounter: 01/20/2023 ID: Joshua Ali, DOB Mar 29, 1941, MRN [id number]  PCP:  Pearson Forster, MD   [facility] Providers Cardiologist:  None { Click to update primary MD,subspecialty MD or APP then REFRESH:1}     Visit Details   VS:  BP 124/62   Pulse 100   Ht 5\' 6"  (1.676 m)   Wt 167 lb (75.8 kg)   BMI 26.95 kg/m  , BMI Body mass index is 26.95 kg/m.  Wt Readings from Last 3 Encounters:  01/20/23 167 lb (75.8 kg)  01/14/23 167 lb (75.8 kg)  12/24/22 167 lb 9.6 oz (76 kg)     Reason for visit: BP check and review medication list Performed today: Vital signs, consulted with Dr Eldridge Dace, DOD Changes (medications, testing, etc.) : none Length of Visit: 15 minutes    Medications Adjustments/Labs and Tests Ordered: No orders of the defined types were placed in this encounter.  No orders of the defined types were placed in this encounter. Pt presents today for BP check, review of medications and to check home BP cuff against our manual reading.  Pt's BP cuff is measuring higher with reading of 131/79 and P-112.  Pt states he has had monitor for about one year and he has never changed the batteries.  Suggested pt try batter change and continue monitoring BP and logging daily.  Will contact pt with any changes Dr Graciela Husbands recommends.  Medication list updated.  Pt verbalizes understanding and was appreciative of the visit.   Signed, Alois Cliche, RN  01/20/2023 11:06 AM

## 2023-01-20 NOTE — Patient Instructions (Signed)
Medication Instructions:  Your physician recommends that you continue on your current medications as directed. Please refer to the Current Medication list given to you today.  *If you need a refill on your cardiac medications before your next appointment, please call your pharmacy*   Lab Work: None ordered.  If you have labs (blood work) drawn today and your tests are completely normal, you will receive your results only by: MyChart Message (if you have MyChart) OR A paper copy in the mail If you have any lab test that is abnormal or we need to change your treatment, we will call you to review the results.   Testing/Procedures: None ordered.    Follow-Up: At [facility], you and your health needs are our priority.  As part of our continuing mission to provide you with exceptional heart care, we have created designated Provider Care Teams.  These Care Teams include your primary Cardiologist (physician) and Advanced Practice Providers (APPs -  Physician Assistants and Nurse Practitioners) who all work together to provide you with the care you need, when you need it.  We recommend signing up for the patient portal called "MyChart".  Sign up information is provided on this After Visit Summary.  MyChart is used to connect with patients for Virtual Visits (Telemedicine).  Patients are able to view lab/test results, encounter notes, upcoming appointments, etc.  Non-urgent messages can be sent to your provider as well.   To learn more about what you can do with MyChart, go to [email or web address].    Your next appointment:   As recommended by Dr Graciela Husbands

## 2023-01-21 ENCOUNTER — Ambulatory Visit: Payer: Medicare Other | Admitting: Physical Medicine and Rehabilitation

## 2023-01-21 ENCOUNTER — Other Ambulatory Visit: Payer: Self-pay

## 2023-01-21 DIAGNOSIS — M5416 Radiculopathy, lumbar region: Secondary | ICD-10-CM

## 2023-01-21 MED ORDER — METHYLPREDNISOLONE ACETATE 80 MG/ML IJ SUSP
80.0000 mg | Freq: Once | INTRAMUSCULAR | Status: AC
  Administered 2023-01-21: 80 mg

## 2023-01-21 NOTE — Progress Notes (Signed)
Right sided low back pain with radicular pain-here for scheduled injection

## 2023-01-21 NOTE — Procedures (Signed)
Lumbar Epidural Steroid Injection - Interlaminar Approach with Fluoroscopic Guidance  Patient: Joshua Ali      Date of Birth: [birth year] MRN: [id number] PCP: Pearson Forster, MD      Visit Date: 12/14/2022   Universal Protocol:     Consent Given By: the patient  Position: PRONE  Additional Comments: Vital signs were monitored before and after the procedure. Patient was prepped and draped in the usual sterile fashion. The correct patient, procedure, and site was verified.   Injection Procedure Details:   Procedure diagnoses: Lumbar radiculopathy [M54.16]   Meds Administered:  Meds ordered this encounter  Medications   methylPREDNISolone acetate (DEPO-MEDROL) injection 80 mg     Laterality: Right  Location/Site:  L5-S1  Needle: 3.5 in., 20 ga. Tuohy  Needle Placement: Paramedian epidural  Findings:   -Comments: Excellent flow of contrast into the epidural space.  Procedure Details: Using a paramedian approach from the side mentioned above, the region overlying the inferior lamina was localized under fluoroscopic visualization and the soft tissues overlying this structure were infiltrated with 4 ml. of 1% Lidocaine without Epinephrine. The Tuohy needle was inserted into the epidural space using a paramedian approach.   The epidural space was localized using loss of resistance along with counter oblique bi-planar fluoroscopic views.  After negative aspirate for air, blood, and CSF, a 2 ml. volume of Isovue-250 was injected into the epidural space and the flow of contrast was observed. Radiographs were obtained for documentation purposes.    The injectate was administered into the level noted above.   Additional Comments:  The patient tolerated the procedure well Dressing: 2 x 2 sterile gauze and Band-Aid    Post-procedure details: Patient was observed during the procedure. Post-procedure instructions were reviewed.  Patient left the clinic in stable  condition.

## 2023-01-21 NOTE — Patient Instructions (Signed)
[facility] Physiatry Discharge Instructions  *At any time if you have questions or concerns they can be answered by calling [phone]  All Patients: You may experience an increase in your symptoms for the first 2 days (it can take 2 days to 2 weeks for the steroid/cortisone to have its maximal effect). You may use ice to the site for the first 24 hours; 20 minutes on and 20 minutes off and may use heat after that time. You may resume and continue your current pain medications. If you need a refill please contact the prescribing physician. You may resume your medications if any were stopped for the procedure. You may shower but no swimming, tub bath or Jacuzzi for 24 hours. Please remove bandage after 4 hours. You may resume light activities as tolerated. If you had Spine Injection, you should not drive for the next 3 hours due to anesthetics used in the procedure. Please have someone drive for you.  *If you have had sedation, Valium, Xanax, or lorazepam: Do not drive or use public transportation for 24 hours, do not operating hazardous machinery or make important personal/business decisions for 24 hours.  POSSIBLE STEROID SIDE EFFECTS: If experienced these should only last for a short period. Change in menstrual flow  Edema in (swelling)  Increased appetite Skin flushing (redness)  Skin rash/acne  Thrush (oral) Vaginitis    Increased sweating  Depression Increased blood glucose levels Cramping and leg/calf  Euphoria (feeling happy)  POSSIBLE PROCEDURE SIDE EFFECTS: Please call our office if concerned. Increased pain Increased numbness/tingling  Headache Nausea/vomiting Hematoma (bruising/bleeding) Edema (swelling at the site) Weakness  Infection (red/drainage at site) Fever greater than 100.61F  *In the event of a headache after epidural steroid injection: Drink plenty of fluids, especially water and try to lay flat when possible. If the headache does not get better after a few days  or as always if concerned please call the office.

## 2023-01-25 NOTE — Progress Notes (Signed)
Joshua Ali - 82 y.o. male MRN [id number]  Date of birth: [birth year]  Office Visit Note: Visit Date: 01/21/2023 PCP: Pearson Forster, MD Referred by: London Sheer, MD  Subjective: Chief Complaint  Patient presents with   Other     Right sided low back pain with radicular pain-here for scheduled injection   HPI:  Joshua Ali is a 82 y.o. male who comes in today at the request of Dr. Willia Craze for planned Right L5-S1 Lumbar Transforaminal epidural steroid injection with fluoroscopic guidance.  The patient has failed conservative care including home exercise, medications, time and activity modification.  This injection will be diagnostic and hopefully therapeutic.  Please see requesting physician notes for further details and justification.   ROS Otherwise per HPI.  Assessment & Plan: Visit Diagnoses:    ICD-10-CM   1. Lumbar radiculopathy  M54.16 XR C-ARM NO REPORT    Epidural Steroid injection    methylPREDNISolone acetate (DEPO-MEDROL) injection 80 mg      Plan: No additional findings.   Meds & Orders:  Meds ordered this encounter  Medications   methylPREDNISolone acetate (DEPO-MEDROL) injection 80 mg    Orders Placed This Encounter  Procedures   XR C-ARM NO REPORT   Epidural Steroid injection    Follow-up: Return for visit to requesting provider as needed.   Procedures: No procedures performed  Lumbosacral Transforaminal Epidural Steroid Injection - Sub-Pedicular Approach with Fluoroscopic Guidance  Patient: Joshua Ali      Date of Birth: 1940/09/15 MRN: [id number] PCP: Pearson Forster, MD      Visit Date: 01/21/2023   Universal Protocol:    Date/Time: 01/21/2023  Consent Given By: the patient  Position: PRONE  Additional Comments: Vital signs were monitored before and after the procedure. Patient was prepped and draped in the usual sterile fashion. The correct patient, procedure, and site was verified.   Injection Procedure Details:    Procedure diagnoses: Lumbar radiculopathy [M54.16]    Meds Administered:  Meds ordered this encounter  Medications   methylPREDNISolone acetate (DEPO-MEDROL) injection 80 mg    Laterality: Right  Location/Site: L5  Needle:5.0 in., 22 ga.  Short bevel or Quincke spinal needle  Needle Placement: Transforaminal  Findings:    -Comments: Excellent flow of contrast along the nerve, nerve root and into the epidural space.  Procedure Details: After squaring off the end-plates to get a true AP view, the C-arm was positioned so that an oblique view of the foramen as noted above was visualized. The target area is just inferior to the "nose of the scotty dog" or sub pedicular. The soft tissues overlying this structure were infiltrated with 2-3 ml. of 1% Lidocaine without Epinephrine.  The spinal needle was inserted toward the target using a "trajectory" view along the fluoroscope beam.  Under AP and lateral visualization, the needle was advanced so it did not puncture dura and was located close the 6 O'Clock position of the pedical in AP tracterory. Biplanar projections were used to confirm position. Aspiration was confirmed to be negative for CSF and/or blood. A 1-2 ml. volume of Isovue-250 was injected and flow of contrast was noted at each level. Radiographs were obtained for documentation purposes.   After attaining the desired flow of contrast documented above, a 0.5 to 1.0 ml test dose of 0.25% Marcaine was injected into each respective transforaminal space.  The patient was observed for 90 seconds post injection.  After no sensory deficits were reported, and  normal lower extremity motor function was noted,   the above injectate was administered so that equal amounts of the injectate were placed at each foramen (level) into the transforaminal epidural space.   Additional Comments:  The patient tolerated the procedure well Dressing: 2 x 2 sterile gauze and Band-Aid    Post-procedure  details: Patient was observed during the procedure. Post-procedure instructions were reviewed.  Patient left the clinic in stable condition.    Clinical History: Etta Quill, Claudean Severance, MD - 10/15/2016 Formatting of this note might be different from the original. TECHNIQUE:  Axial noncontrast CT of the lumbar spine with sagittal and coronal reformats. Radiation dose reduction was utilized (automated exposure control, mA or kV adjustment based on patient size, or iterative image reconstruction). COMPARISON: Radiographs 09/01/2016 INDICATION: Low back pain radiating to left hip and left leg. History of colorectal cancer with surgery, chemotherapy and radiation.  FINDINGS:  No acute fracture. No aggressive appearing osseous lesions. Small sclerotic focus of right L5 with typical appearance for a bone island. Probable small hemangioma of L2. There is reversal of the typical lordosis at the L1-2 level. Trace retrolisthesis of L2 on L3, L3 on L4 and L4 on L5. No paraspinous soft tissue fluid collections. There is ill-defined, partially imaged soft tissue density in the presacral fat with some calcification and apparent surgical hardware related to previous treatment. A small tack-like density is partially embedded in the anterior aspect of S2 sagittal image 37.  Levels: T12-L1: Unremarkable. L1-L2: Disc degenerative change with height loss greatest anteriorly, bulky anterior osteophytes. Minimal posterior disc bulge is suggested. No evidence of high-grade stenosis. L2-L3: Disc degenerative change with height loss greatest anteriorly. Bilateral facet arthrosis. Probable mild canal stenosis and mild-to-moderate foraminal stenosis bilaterally. L3-L4: Disc degenerative change with diffuse height loss. Bilateral facet arthrosis. Probable mild-to-moderate foraminal stenosis bilaterally. L4-L5: Disc degenerative change with diffuse high loss, small posterior bulge and bilateral facet arthrosis. Probable  moderate to severe bilateral foraminal stenosis. L5-S1: Disc space height is maintained. There is bilateral facet arthrosis with probable moderate to severe bilateral foraminal stenosis.   IMPRESSION: 1. 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 2. Reversal of lordosis with minimal degenerative subluxations. 3. No high-grade canal stenosis is suggested. Exam End: 10/14/16 14:16     Objective:  VS:  HT:    WT:   BMI:     BP:   HR: bpm  TEMP: ( )  RESP:  Physical Exam Vitals and nursing note reviewed.  Constitutional:      General: He is not in acute distress.    Appearance: Normal appearance. He is not ill-appearing.  HENT:     Head: Normocephalic and atraumatic.     Right Ear: External ear normal.     Left Ear: External ear normal.     Nose: No congestion.  Eyes:     Extraocular Movements: Extraocular movements intact.  Cardiovascular:     Rate and Rhythm: Normal rate.     Pulses: Normal pulses.  Pulmonary:     Effort: Pulmonary effort is normal. No respiratory distress.  Abdominal:     General: There is no distension.     Palpations: Abdomen is soft.  Musculoskeletal:        General: No tenderness or signs of injury.     Cervical back: Neck supple.     Right lower  leg: No edema.     Left lower leg: No edema.     Comments: Patient has good distal strength without clonus.  Skin:    Findings: No erythema or rash.  Neurological:     General: No focal deficit present.     Mental Status: He is alert and oriented to person, place, and time.     Sensory: No sensory deficit.     Motor: No weakness or abnormal muscle tone.     Coordination: Coordination normal.  Psychiatric:        Mood and Affect: Mood normal.        Behavior: Behavior normal.      Imaging: No results found.

## 2023-01-25 NOTE — Procedures (Signed)
Lumbosacral Transforaminal Epidural Steroid Injection - Sub-Pedicular Approach with Fluoroscopic Guidance  Patient: Joshua Ali      Date of Birth: 10/11/1940 MRN: [id number] PCP: Pearson Forster, MD      Visit Date: 01/21/2023   Universal Protocol:    Date/Time: 01/21/2023  Consent Given By: the patient  Position: PRONE  Additional Comments: Vital signs were monitored before and after the procedure. Patient was prepped and draped in the usual sterile fashion. The correct patient, procedure, and site was verified.   Injection Procedure Details:   Procedure diagnoses: Lumbar radiculopathy [M54.16]    Meds Administered:  Meds ordered this encounter  Medications   methylPREDNISolone acetate (DEPO-MEDROL) injection 80 mg    Laterality: Right  Location/Site: L5  Needle:5.0 in., 22 ga.  Short bevel or Quincke spinal needle  Needle Placement: Transforaminal  Findings:    -Comments: Excellent flow of contrast along the nerve, nerve root and into the epidural space.  Procedure Details: After squaring off the end-plates to get a true AP view, the C-arm was positioned so that an oblique view of the foramen as noted above was visualized. The target area is just inferior to the "nose of the scotty dog" or sub pedicular. The soft tissues overlying this structure were infiltrated with 2-3 ml. of 1% Lidocaine without Epinephrine.  The spinal needle was inserted toward the target using a "trajectory" view along the fluoroscope beam.  Under AP and lateral visualization, the needle was advanced so it did not puncture dura and was located close the 6 O'Clock position of the pedical in AP tracterory. Biplanar projections were used to confirm position. Aspiration was confirmed to be negative for CSF and/or blood. A 1-2 ml. volume of Isovue-250 was injected and flow of contrast was noted at each level. Radiographs were obtained for documentation purposes.   After attaining the desired flow  of contrast documented above, a 0.5 to 1.0 ml test dose of 0.25% Marcaine was injected into each respective transforaminal space.  The patient was observed for 90 seconds post injection.  After no sensory deficits were reported, and normal lower extremity motor function was noted,   the above injectate was administered so that equal amounts of the injectate were placed at each foramen (level) into the transforaminal epidural space.   Additional Comments:  The patient tolerated the procedure well Dressing: 2 x 2 sterile gauze and Band-Aid    Post-procedure details: Patient was observed during the procedure. Post-procedure instructions were reviewed.  Patient left the clinic in stable condition.

## 2023-01-26 ENCOUNTER — Ambulatory Visit: Payer: Medicare Other

## 2023-01-26 DIAGNOSIS — I495 Sick sinus syndrome: Secondary | ICD-10-CM | POA: Diagnosis not present

## 2023-01-26 LAB — CUP PACEART REMOTE DEVICE CHECK
Battery Remaining Longevity: 103 mo
Battery Remaining Percentage: 80 %
Battery Voltage: 3.01 V
Brady Statistic AP VP Percent: 1 %
Brady Statistic AP VS Percent: 1 %
Brady Statistic AS VP Percent: 1 %
Brady Statistic AS VS Percent: 99 %
Brady Statistic RA Percent Paced: 1 %
Brady Statistic RV Percent Paced: 1 %
Date Time Interrogation Session: 20240924020014
Implantable Lead Connection Status: 753985
Implantable Lead Connection Status: 753985
Implantable Lead Implant Date: 20100903
Implantable Lead Implant Date: 20100903
Implantable Lead Location: 753859
Implantable Lead Location: 753860
Implantable Pulse Generator Implant Date: 20220325
Lead Channel Impedance Value: 450 Ohm
Lead Channel Impedance Value: 650 Ohm
Lead Channel Pacing Threshold Amplitude: 0.625 V
Lead Channel Pacing Threshold Amplitude: 1.25 V
Lead Channel Pacing Threshold Pulse Width: 0.4 ms
Lead Channel Pacing Threshold Pulse Width: 0.4 ms
Lead Channel Sensing Intrinsic Amplitude: 12 mV
Lead Channel Sensing Intrinsic Amplitude: 5 mV
Lead Channel Setting Pacing Amplitude: 1.5 V
Lead Channel Setting Pacing Amplitude: 1.625
Lead Channel Setting Pacing Pulse Width: 0.4 ms
Lead Channel Setting Sensing Sensitivity: 2 mV
Pulse Gen Model: 2272
Pulse Gen Serial Number: 3910054

## 2023-01-28 ENCOUNTER — Ambulatory Visit: Payer: Medicare Other | Admitting: Orthopedic Surgery

## 2023-01-28 DIAGNOSIS — M5416 Radiculopathy, lumbar region: Secondary | ICD-10-CM | POA: Diagnosis not present

## 2023-01-28 NOTE — Progress Notes (Signed)
Orthopedic Spine Surgery Office Note   Assessment: Patient is a 82 y.o. male with low back pain that radiates into the right lower extremity along the lateral aspect of the thigh and leg, likely radiculopathy. Got significant improvement with injection     Plan: -Patient has tried Tylenol, PT, gabapentin, lumbar steroid injection  -If his injections provide him with lasting relief, will continue that treatment. If they stop working, would want an MRI to evaluate further. He is not interested in surgery so could try lyrica since has not tried that -Told him to hold off on getting the MRI since his pain is doing much better -Patient should return to office on an as needed basis     Patient expressed understanding of the plan and all questions were answered to the patient's satisfaction.    ___________________________________________________________________________     History:   Patient is a 82 y.o. male who presents today for follow up on his lumbar spine.  Patient has had low back pain that radiates into the right lower extremity.  He has been feeling along the lateral aspect of the thigh and leg.  After last visit, he got an injection with Dr. Alvester Morin.  He said he is feeling excellent.  Said he got 80% relief with that injection.  He is able to do things he wants to do at this point.  He has no complaints today.   Treatments tried: Tylenol, PT, gabapentin, lumbar steroid injection     Physical Exam:   General: no acute distress, appears stated age Neurologic: alert, answering questions appropriately, following commands Respiratory: unlabored breathing on room air, symmetric chest rise Psychiatric: appropriate affect, normal cadence to speech     MSK (spine):   -Strength exam                                                   Left                  Right EHL                              5/5                  4/5 TA                                 5/5                  5/5 GSC                              5/5                  5/5 Knee extension            5/5                  5/5 Hip flexion                    5/5                  5/5   -Sensory exam  Sensation intact to light touch in L3-S1 nerve distributions of bilateral lower extremities   -Achilles DTR: 2/4 on the left, 2/4 on the right -Patellar tendon DTR: 2/4 on the left, 2/4 on the right   -Straight leg raise: negative bilaterally -Femoral nerve stretch test: negative bilaterally -Clonus: no beats bilaterally   Imaging: XRs of the lumbar spine from 12/24/2022 was previously independently reviewed and interpreted, showing disc height loss at T12/L1, L1/2, L2/3, L3/4, L4/5.  Anterior osteophyte formation seen at L1/2, L2/3, L3/4.  No fracture or dislocation seen.  No evidence of instability on flexion/extension views.   CT of the lumbar spine from 11/17/2022 was previously independently reviewed and interpreted, showing vacuum disc phenomenon at T12/L1, L1/2, L2/3, L3/4.  Disc height loss from T12/L1 to L4/5.  Anterior osteophyte formation noted particularly at L1/2.  Facet arthropathy worst at L4/5 and L5/S1.  No fracture or dislocation seen.     Patient name: Joshua Ali Patient MRN: [id number] Date of visit: 01/28/23

## 2023-02-11 NOTE — Progress Notes (Signed)
Remote pacemaker transmission.   

## 2023-02-18 ENCOUNTER — Encounter: Payer: Self-pay | Admitting: Orthopedic Surgery

## 2023-02-18 ENCOUNTER — Telehealth: Payer: Self-pay | Admitting: Orthopedic Surgery

## 2023-02-18 NOTE — Telephone Encounter (Signed)
Called pt wife and LVM about setting up MRI review please advise if calls back

## 2023-03-03 ENCOUNTER — Ambulatory Visit (HOSPITAL_COMMUNITY): Payer: Medicare Other

## 2023-04-27 ENCOUNTER — Ambulatory Visit (INDEPENDENT_AMBULATORY_CARE_PROVIDER_SITE_OTHER): Payer: Medicare Other

## 2023-04-27 DIAGNOSIS — I495 Sick sinus syndrome: Secondary | ICD-10-CM

## 2023-04-28 LAB — CUP PACEART REMOTE DEVICE CHECK
Battery Remaining Longevity: 101 mo
Battery Remaining Percentage: 78 %
Battery Voltage: 3.01 V
Brady Statistic AP VP Percent: 1 %
Brady Statistic AP VS Percent: 1 %
Brady Statistic AS VP Percent: 1 %
Brady Statistic AS VS Percent: 99 %
Brady Statistic RA Percent Paced: 1 %
Brady Statistic RV Percent Paced: 1 %
Date Time Interrogation Session: 20241224020020
Implantable Lead Connection Status: 753985
Implantable Lead Connection Status: 753985
Implantable Lead Implant Date: 20100903
Implantable Lead Implant Date: 20100903
Implantable Lead Location: 753859
Implantable Lead Location: 753860
Implantable Pulse Generator Implant Date: 20220325
Lead Channel Impedance Value: 430 Ohm
Lead Channel Impedance Value: 630 Ohm
Lead Channel Pacing Threshold Amplitude: 0.5 V
Lead Channel Pacing Threshold Amplitude: 1.125 V
Lead Channel Pacing Threshold Pulse Width: 0.4 ms
Lead Channel Pacing Threshold Pulse Width: 0.4 ms
Lead Channel Sensing Intrinsic Amplitude: 12 mV
Lead Channel Sensing Intrinsic Amplitude: 5 mV
Lead Channel Setting Pacing Amplitude: 1.375
Lead Channel Setting Pacing Amplitude: 1.5 V
Lead Channel Setting Pacing Pulse Width: 0.4 ms
Lead Channel Setting Sensing Sensitivity: 2 mV
Pulse Gen Model: 2272
Pulse Gen Serial Number: 3910054

## 2023-06-02 ENCOUNTER — Encounter: Payer: Self-pay | Admitting: Internal Medicine

## 2023-06-08 NOTE — Addendum Note (Signed)
Addended by: Geralyn Flash D on: 06/08/2023 01:14 PM   Modules accepted: Orders

## 2023-06-08 NOTE — Progress Notes (Signed)
 Remote pacemaker transmission.

## 2023-07-27 ENCOUNTER — Ambulatory Visit (INDEPENDENT_AMBULATORY_CARE_PROVIDER_SITE_OTHER): Payer: Medicare Other

## 2023-07-27 DIAGNOSIS — I495 Sick sinus syndrome: Secondary | ICD-10-CM

## 2023-07-27 LAB — CUP PACEART REMOTE DEVICE CHECK
Battery Remaining Longevity: 97 mo
Battery Remaining Percentage: 76 %
Battery Voltage: 3.01 V
Brady Statistic AP VP Percent: 1 %
Brady Statistic AP VS Percent: 1 %
Brady Statistic AS VP Percent: 1 %
Brady Statistic AS VS Percent: 99 %
Brady Statistic RA Percent Paced: 1 %
Brady Statistic RV Percent Paced: 1 %
Date Time Interrogation Session: 20250325020024
Implantable Lead Connection Status: 753985
Implantable Lead Connection Status: 753985
Implantable Lead Implant Date: 20100903
Implantable Lead Implant Date: 20100903
Implantable Lead Location: 753859
Implantable Lead Location: 753860
Implantable Pulse Generator Implant Date: 20220325
Lead Channel Impedance Value: 430 Ohm
Lead Channel Impedance Value: 600 Ohm
Lead Channel Pacing Threshold Amplitude: 0.625 V
Lead Channel Pacing Threshold Amplitude: 1.25 V
Lead Channel Pacing Threshold Pulse Width: 0.4 ms
Lead Channel Pacing Threshold Pulse Width: 0.4 ms
Lead Channel Sensing Intrinsic Amplitude: 12 mV
Lead Channel Sensing Intrinsic Amplitude: 4.9 mV
Lead Channel Setting Pacing Amplitude: 1.5 V
Lead Channel Setting Pacing Amplitude: 1.625
Lead Channel Setting Pacing Pulse Width: 0.4 ms
Lead Channel Setting Sensing Sensitivity: 2 mV
Pulse Gen Model: 2272
Pulse Gen Serial Number: 3910054

## 2023-08-12 ENCOUNTER — Telehealth: Payer: Self-pay | Admitting: Physical Medicine and Rehabilitation

## 2023-08-12 NOTE — Telephone Encounter (Signed)
 Pt called requesting to repeat back injections with Dr Alvester Morin. Pt also is asking for oxycodone until he can get into his appt. Please call pt's wife Dennie Bible at [phone].

## 2023-08-14 ENCOUNTER — Encounter: Payer: Self-pay | Admitting: Internal Medicine

## 2023-08-18 ENCOUNTER — Ambulatory Visit: Admitting: Physical Medicine and Rehabilitation

## 2023-08-18 ENCOUNTER — Encounter: Payer: Self-pay | Admitting: Internal Medicine

## 2023-08-18 ENCOUNTER — Encounter: Payer: Self-pay | Admitting: Physical Medicine and Rehabilitation

## 2023-08-18 DIAGNOSIS — M5442 Lumbago with sciatica, left side: Secondary | ICD-10-CM

## 2023-08-18 DIAGNOSIS — M5416 Radiculopathy, lumbar region: Secondary | ICD-10-CM | POA: Diagnosis not present

## 2023-08-18 DIAGNOSIS — M47816 Spondylosis without myelopathy or radiculopathy, lumbar region: Secondary | ICD-10-CM

## 2023-08-18 DIAGNOSIS — M48061 Spinal stenosis, lumbar region without neurogenic claudication: Secondary | ICD-10-CM

## 2023-08-18 DIAGNOSIS — G8929 Other chronic pain: Secondary | ICD-10-CM | POA: Diagnosis not present

## 2023-08-18 DIAGNOSIS — M5441 Lumbago with sciatica, right side: Secondary | ICD-10-CM | POA: Diagnosis not present
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
## 2023-08-18 NOTE — Progress Notes (Unsigned)
 Pain Scale   Average Pain 5 Patient advising that his pain is in lower back radiating to his right hip and he is noticing that he is beginning to have some discomfort radiating to left hip.        +Driver, -BT, -Dye Allergies.

## 2023-08-27 ENCOUNTER — Encounter: Payer: Self-pay | Admitting: Physical Medicine and Rehabilitation

## 2023-08-27 ENCOUNTER — Other Ambulatory Visit: Payer: Self-pay | Admitting: Physical Medicine and Rehabilitation

## 2023-08-27 MED ORDER — OXYCODONE HCL 5 MG PO TABS: 5.0000 mg | ORAL_TABLET | Freq: Two times a day (BID) | ORAL | 0 refills | Status: DC | PRN

## 2023-08-30 ENCOUNTER — Other Ambulatory Visit: Payer: Self-pay

## 2023-08-30 ENCOUNTER — Ambulatory Visit: Admitting: Physical Medicine and Rehabilitation

## 2023-08-30 VITALS — BP 145/84 | HR 96

## 2023-08-30 DIAGNOSIS — M5416 Radiculopathy, lumbar region: Secondary | ICD-10-CM | POA: Diagnosis not present

## 2023-08-30 MED ORDER — METHYLPREDNISOLONE ACETATE 40 MG/ML IJ SUSP
40.0000 mg | Freq: Once | INTRAMUSCULAR | Status: AC
  Administered 2023-08-30: 40 mg

## 2023-08-30 NOTE — Patient Instructions (Signed)

## 2023-08-30 NOTE — Procedures (Signed)
 Lumbosacral Transforaminal Epidural Steroid Injection - Sub-Pedicular Approach with Fluoroscopic Guidance  Patient: Joshua Ali      Date of Birth: Oct 13, 1940 MRN: [id number] PCP: Renda Carpen, MD      Visit Date: 08/30/2023   Universal Protocol:    Date/Time: 08/30/2023  Consent Given By: the patient  Position: PRONE  Additional Comments: Vital signs were monitored before and after the procedure. Patient was prepped and draped in the usual sterile fashion. The correct patient, procedure, and site was verified.   Injection Procedure Details:   Procedure diagnoses: Lumbar radiculopathy [M54.16]    Meds Administered:  Meds ordered this encounter  Medications   methylPREDNISolone  acetate (DEPO-MEDROL ) injection 40 mg    Laterality: Bilateral  Location/Site: L5  Needle:5.0 in., 22 ga.  Short bevel or Quincke spinal needle  Needle Placement: Transforaminal  Findings:    -Comments: Excellent flow of contrast along the nerve, nerve root and into the epidural space.  Procedure Details: After squaring off the end-plates to get a true AP view, the C-arm was positioned so that an oblique view of the foramen as noted above was visualized. The target area is just inferior to the "nose of the scotty dog" or sub pedicular. The soft tissues overlying this structure were infiltrated with 2-3 ml. of 1% Lidocaine  without Epinephrine .  The spinal needle was inserted toward the target using a "trajectory" view along the fluoroscope beam.  Under AP and lateral visualization, the needle was advanced so it did not puncture dura and was located close the 6 O'Clock position of the pedical in AP tracterory. Biplanar projections were used to confirm position. Aspiration was confirmed to be negative for CSF and/or blood. A 1-2 ml. volume of Isovue-250 was injected and flow of contrast was noted at each level. Radiographs were obtained for documentation purposes.   After attaining the desired  flow of contrast documented above, a 0.5 to 1.0 ml test dose of 0.25% Marcaine  was injected into each respective transforaminal space.  The patient was observed for 90 seconds post injection.  After no sensory deficits were reported, and normal lower extremity motor function was noted,   the above injectate was administered so that equal amounts of the injectate were placed at each foramen (level) into the transforaminal epidural space.   Additional Comments:  The patient tolerated the procedure well Dressing: 2 x 2 sterile gauze and Band-Aid    Post-procedure details: Patient was observed during the procedure. Post-procedure instructions were reviewed.  Patient left the clinic in stable condition.

## 2023-08-30 NOTE — Progress Notes (Signed)
 Joshua Ali - 83 y.o. male MRN [id number]  Date of birth: 02-01-1941  Office Visit Note: Visit Date: 08/30/2023 PCP: Renda Carpen, MD Referred by: Renda Carpen, MD  Subjective: Chief Complaint  Patient presents with   Lower Back - Pain   HPI:  Joshua Ali is a 83 y.o. male who comes in today at the request of Elvan Hamel, FNP for planned Bilateral L5-S1 Lumbar Transforaminal epidural steroid injection with fluoroscopic guidance.  The patient has failed conservative care including home exercise, medications, time and activity modification.  This injection will be diagnostic and hopefully therapeutic.  Please see requesting physician notes for further details and justification.   ROS Otherwise per HPI.  Assessment & Plan: Visit Diagnoses:    ICD-10-CM   1. Lumbar radiculopathy  M54.16 XR C-ARM NO REPORT    Epidural Steroid injection    methylPREDNISolone  acetate (DEPO-MEDROL ) injection 40 mg      Plan: No additional findings.   Meds & Orders:  Meds ordered this encounter  Medications   methylPREDNISolone  acetate (DEPO-MEDROL ) injection 40 mg    Orders Placed This Encounter  Procedures   XR C-ARM NO REPORT   Epidural Steroid injection    Follow-up: Return for visit to requesting provider as needed.   Procedures: No procedures performed  Lumbosacral Transforaminal Epidural Steroid Injection - Sub-Pedicular Approach with Fluoroscopic Guidance  Patient: Joshua Ali      Date of Birth: [birth year] MRN: [id number] PCP: Renda Carpen, MD      Visit Date: 08/30/2023   Universal Protocol:    Date/Time: 08/30/2023  Consent Given By: the patient  Position: PRONE  Additional Comments: Vital signs were monitored before and after the procedure. Patient was prepped and draped in the usual sterile fashion. The correct patient, procedure, and site was verified.   Injection Procedure Details:   Procedure diagnoses: Lumbar radiculopathy [M54.16]    Meds  Administered:  Meds ordered this encounter  Medications   methylPREDNISolone  acetate (DEPO-MEDROL ) injection 40 mg    Laterality: Bilateral  Location/Site: L5  Needle:5.0 in., 22 ga.  Short bevel or Quincke spinal needle  Needle Placement: Transforaminal  Findings:    -Comments: Excellent flow of contrast along the nerve, nerve root and into the epidural space.  Procedure Details: After squaring off the end-plates to get a true AP view, the C-arm was positioned so that an oblique view of the foramen as noted above was visualized. The target area is just inferior to the "nose of the scotty dog" or sub pedicular. The soft tissues overlying this structure were infiltrated with 2-3 ml. of 1% Lidocaine  without Epinephrine .  The spinal needle was inserted toward the target using a "trajectory" view along the fluoroscope beam.  Under AP and lateral visualization, the needle was advanced so it did not puncture dura and was located close the 6 O'Clock position of the pedical in AP tracterory. Biplanar projections were used to confirm position. Aspiration was confirmed to be negative for CSF and/or blood. A 1-2 ml. volume of Isovue-250 was injected and flow of contrast was noted at each level. Radiographs were obtained for documentation purposes.   After attaining the desired flow of contrast documented above, a 0.5 to 1.0 ml test dose of 0.25% Marcaine  was injected into each respective transforaminal space.  The patient was observed for 90 seconds post injection.  After no sensory deficits were reported, and normal lower extremity motor function was noted,   the above injectate  was administered so that equal amounts of the injectate were placed at each foramen (level) into the transforaminal epidural space.   Additional Comments:  The patient tolerated the procedure well Dressing: 2 x 2 sterile gauze and Band-Aid    Post-procedure details: Patient was observed during the  procedure. Post-procedure instructions were reviewed.  Patient left the clinic in stable condition.    Clinical History: Alaine Alken, Pauline Bos, MD - 10/15/2016 Formatting of this note might be different from the original. TECHNIQUE:  Axial noncontrast CT of the lumbar spine with sagittal and coronal reformats. Radiation dose reduction was utilized (automated exposure control, mA or kV adjustment based on patient size, or iterative image reconstruction). COMPARISON: Radiographs 09/01/2016 INDICATION: Low back pain radiating to left hip and left leg. History of colorectal cancer with surgery, chemotherapy and radiation.  FINDINGS:  No acute fracture. No aggressive appearing osseous lesions. Small sclerotic focus of right L5 with typical appearance for a bone island. Probable small hemangioma of L2. There is reversal of the typical lordosis at the L1-2 level. Trace retrolisthesis of L2 on L3, L3 on L4 and L4 on L5. No paraspinous soft tissue fluid collections. There is ill-defined, partially imaged soft tissue density in the presacral fat with some calcification and apparent surgical hardware related to previous treatment. A small tack-like density is partially embedded in the anterior aspect of S2 sagittal image 37.  Levels: T12-L1: Unremarkable. L1-L2: Disc degenerative change with height loss greatest anteriorly, bulky anterior osteophytes. Minimal posterior disc bulge is suggested. No evidence of high-grade stenosis. L2-L3: Disc degenerative change with height loss greatest anteriorly. Bilateral facet arthrosis. Probable mild canal stenosis and mild-to-moderate foraminal stenosis bilaterally. L3-L4: Disc degenerative change with diffuse height loss. Bilateral facet arthrosis. Probable mild-to-moderate foraminal stenosis bilaterally. L4-L5: Disc degenerative change with diffuse high loss, small posterior bulge and bilateral facet arthrosis. Probable moderate to severe bilateral foraminal  stenosis. L5-S1: Disc space height is maintained. There is bilateral facet arthrosis with probable moderate to severe bilateral foraminal stenosis.   IMPRESSION: 1. 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 2. Reversal of lordosis with minimal degenerative subluxations. 3. No high-grade canal stenosis is suggested. Exam End: 10/14/16 14:16     Objective:  VS:  HT:    WT:   BMI:     BP:(!) 145/84  HR:96bpm  TEMP: ( )  RESP:  Physical Exam Vitals and nursing note reviewed.  Constitutional:      General: He is not in acute distress.    Appearance: Normal appearance. He is not ill-appearing.  HENT:     Head: Normocephalic and atraumatic.     Right Ear: External ear normal.     Left Ear: External ear normal.     Nose: No congestion.  Eyes:     Extraocular Movements: Extraocular movements intact.  Cardiovascular:     Rate and Rhythm: Normal rate.     Pulses: Normal pulses.  Pulmonary:     Effort: Pulmonary effort is normal. No respiratory distress.  Abdominal:     General: There is no distension.     Palpations: Abdomen is soft.  Musculoskeletal:        General: No tenderness or signs of injury.     Cervical back: Neck supple.     Right lower leg: No edema.     Left lower leg: No edema.  Comments: Patient has good distal strength without clonus.  Skin:    Findings: No erythema or rash.  Neurological:     General: No focal deficit present.     Mental Status: He is alert and oriented to person, place, and time.     Sensory: No sensory deficit.     Motor: No weakness or abnormal muscle tone.     Coordination: Coordination normal.  Psychiatric:        Mood and Affect: Mood normal.        Behavior: Behavior normal.      Imaging: No results found.

## 2023-08-30 NOTE — Progress Notes (Signed)
 Pain Scale   Average Pain 8 Patient advising walking and standing increases his lower back pain, Patient advising that sitting in recliner with heating pad helps some.        +Driver, -BT, -Dye Allergies.

## 2023-09-06 ENCOUNTER — Encounter: Payer: Self-pay | Admitting: Physical Medicine and Rehabilitation

## 2023-09-06 DIAGNOSIS — M5416 Radiculopathy, lumbar region: Secondary | ICD-10-CM

## 2023-09-07 MED ORDER — OXYCODONE HCL 5 MG PO TABS: 5.0000 mg | ORAL_TABLET | Freq: Four times a day (QID) | ORAL | 0 refills | Status: DC | PRN

## 2023-09-10 NOTE — Progress Notes (Signed)
 Remote pacemaker transmission.

## 2023-09-23 ENCOUNTER — Ambulatory Visit: Admitting: Physical Medicine and Rehabilitation

## 2023-09-23 ENCOUNTER — Other Ambulatory Visit: Payer: Self-pay

## 2023-09-23 DIAGNOSIS — M5416 Radiculopathy, lumbar region: Secondary | ICD-10-CM | POA: Diagnosis not present

## 2023-09-23 MED ORDER — METHYLPREDNISOLONE ACETATE 40 MG/ML IJ SUSP
40.0000 mg | Freq: Once | INTRAMUSCULAR | Status: AC
  Administered 2023-09-23: 40 mg

## 2023-09-23 MED ORDER — OXYCODONE HCL 5 MG PO TABS: 5.0000 mg | ORAL_TABLET | Freq: Four times a day (QID) | ORAL | 0 refills | Status: DC | PRN

## 2023-09-23 NOTE — Patient Instructions (Signed)

## 2023-09-23 NOTE — Progress Notes (Unsigned)
 Pain Scale   Average Pain 8 Lower back pain with right leg radiculopathy. Right leg swells occasionally.        +Driver, -BT, -Dye Allergies.

## 2023-09-29 NOTE — Procedures (Signed)
 Lumbar Epidural Steroid Injection - Interlaminar Approach with Fluoroscopic Guidance  Patient: Joshua Ali      Date of Birth: June 28, 1940 MRN: [id number] PCP: Renda Carpen, MD      Visit Date: 09/23/2023   Universal Protocol:     Consent Given By: the patient  Position: PRONE  Additional Comments: Vital signs were monitored before and after the procedure. Patient was prepped and draped in the usual sterile fashion. The correct patient, procedure, and site was verified.   Injection Procedure Details:   Procedure diagnoses: Lumbar radiculopathy [M54.16]   Meds Administered:  Meds ordered this encounter  Medications   methylPREDNISolone  acetate (DEPO-MEDROL ) injection 40 mg   oxyCODONE  (OXY IR/ROXICODONE ) 5 MG immediate release tablet    Sig: Take 1 tablet (5 mg total) by mouth every 6 (six) hours as needed for severe pain (pain score 7-10).    Dispense:  30 tablet    Refill:  0     Laterality: Right  Location/Site:  L5-S1  Needle: 3.5 in., 20 ga. Tuohy  Needle Placement: Paramedian epidural  Findings:   -Comments: Excellent flow of contrast into the epidural space.  Procedure Details: Using a paramedian approach from the side mentioned above, the region overlying the inferior lamina was localized under fluoroscopic visualization and the soft tissues overlying this structure were infiltrated with 4 ml. of 1% Lidocaine  without Epinephrine . The Tuohy needle was inserted into the epidural space using a paramedian approach.   The epidural space was localized using loss of resistance along with counter oblique bi-planar fluoroscopic views.  After negative aspirate for air, blood, and CSF, a 2 ml. volume of Isovue-250 was injected into the epidural space and the flow of contrast was observed. Radiographs were obtained for documentation purposes.    The injectate was administered into the level noted above.   Additional Comments:  The patient tolerated the procedure  well Dressing: 2 x 2 sterile gauze and Band-Aid    Post-procedure details: Patient was observed during the procedure. Post-procedure instructions were reviewed.  Patient left the clinic in stable condition.

## 2023-09-29 NOTE — Progress Notes (Signed)
 Joshua Ali - 83 y.o. male MRN [id number]  Date of birth: 12/06/1940  Office Visit Note: Visit Date: 09/23/2023 PCP: Renda Carpen, MD Referred by: Renda Carpen, MD  Subjective: Chief Complaint  Patient presents with   Back Pain   HPI:  Joshua Ali is a 83 y.o. male who comes in today for planned repeat Right L5-S1  Lumbar Interlaminar epidural steroid injection with fluoroscopic guidance.  The patient has failed conservative care including home exercise, medications, time and activity modification.  This injection will be diagnostic and hopefully therapeutic.  Please see requesting physician notes for further details and justification. Patient received more than 50% pain relief from prior injection.   Referring: Elvan Hamel, FNP   ROS Otherwise per HPI.  Assessment & Plan: Visit Diagnoses:    ICD-10-CM   1. Lumbar radiculopathy  M54.16 XR C-ARM NO REPORT    Epidural Steroid injection    methylPREDNISolone  acetate (DEPO-MEDROL ) injection 40 mg      Plan: No additional findings.   Meds & Orders:  Meds ordered this encounter  Medications   methylPREDNISolone  acetate (DEPO-MEDROL ) injection 40 mg   oxyCODONE  (OXY IR/ROXICODONE ) 5 MG immediate release tablet    Sig: Take 1 tablet (5 mg total) by mouth every 6 (six) hours as needed for severe pain (pain score 7-10).    Dispense:  30 tablet    Refill:  0    Orders Placed This Encounter  Procedures   XR C-ARM NO REPORT   Epidural Steroid injection    Follow-up: Return for visit to requesting provider as needed.   Procedures: No procedures performed  Lumbar Epidural Steroid Injection - Interlaminar Approach with Fluoroscopic Guidance  Patient: Joshua Ali      Date of Birth: 1940/07/29 MRN: [id number] PCP: Renda Carpen, MD      Visit Date: 09/23/2023   Universal Protocol:     Consent Given By: the patient  Position: PRONE  Additional Comments: Vital signs were monitored before and after the  procedure. Patient was prepped and draped in the usual sterile fashion. The correct patient, procedure, and site was verified.   Injection Procedure Details:   Procedure diagnoses: Lumbar radiculopathy [M54.16]   Meds Administered:  Meds ordered this encounter  Medications   methylPREDNISolone  acetate (DEPO-MEDROL ) injection 40 mg   oxyCODONE  (OXY IR/ROXICODONE ) 5 MG immediate release tablet    Sig: Take 1 tablet (5 mg total) by mouth every 6 (six) hours as needed for severe pain (pain score 7-10).    Dispense:  30 tablet    Refill:  0     Laterality: Right  Location/Site:  L5-S1  Needle: 3.5 in., 20 ga. Tuohy  Needle Placement: Paramedian epidural  Findings:   -Comments: Excellent flow of contrast into the epidural space.  Procedure Details: Using a paramedian approach from the side mentioned above, the region overlying the inferior lamina was localized under fluoroscopic visualization and the soft tissues overlying this structure were infiltrated with 4 ml. of 1% Lidocaine  without Epinephrine . The Tuohy needle was inserted into the epidural space using a paramedian approach.   The epidural space was localized using loss of resistance along with counter oblique bi-planar fluoroscopic views.  After negative aspirate for air, blood, and CSF, a 2 ml. volume of Isovue-250 was injected into the epidural space and the flow of contrast was observed. Radiographs were obtained for documentation purposes.    The injectate was administered into the level noted above.  Additional Comments:  The patient tolerated the procedure well Dressing: 2 x 2 sterile gauze and Band-Aid    Post-procedure details: Patient was observed during the procedure. Post-procedure instructions were reviewed.  Patient left the clinic in stable condition.   Clinical History: CT LUMBAR SPINE WITHOUT CONTRAST   TECHNIQUE: Multidetector CT imaging of the lumbar spine was performed without intravenous  contrast administration. Multiplanar CT image reconstructions were also generated.   RADIATION DOSE REDUCTION: This exam was performed according to the departmental dose-optimization program which includes automated exposure control, adjustment of the mA and/or kV according to patient size and/or use of iterative reconstruction technique.   COMPARISON:  10/29/2022, 09/03/2014.   FINDINGS: Segmentation: 5 lumbar type vertebrae.   Alignment: There is mild retrolisthesis at L2-L3, L3-L4, and L5-S1. Loss of normal lumbar lordosis is noted.   Vertebrae: No acute fracture or focal pathologic process. A stable sclerotic lesion is noted in the L5 vertebral body.   Paraspinal and other soft tissues: Aortic atherosclerosis.   Disc levels:   L1-L2: There is a disc herniation osteophyte formation, facet arthropathy and mild ligamentum flavum thickening resulting in mild spinal canal stenosis. The neural foramina are patent bilaterally.   L2-L3: Disc herniation with osteophyte formation, facet arthropathy and ligamentum flavum thickening resulting in mild-to-moderate spinal canal stenosis. There is mild neural foraminal stenosis bilaterally.   L3-L4: There is a disc herniation with osteophyte formation, facet arthropathy, and ligamentum flavum thickening resulting in moderate to severe spinal canal stenosis. There is moderate neural foraminal stenosis bilaterally.   L4-L5: There is a disc herniation with osteophyte formation, facet arthropathy, and ligamentum flavum thickening resulting in mild-to-moderate spinal canal stenosis. There is severe neural foraminal stenosis bilaterally.   L5-S1: There is a disc herniation with osteophyte formation, facet arthropathy, and ligamentum flavum thickening. There is extension of soft tissue density into the neural foraminal space on the right, possible disc herniation, extruded material, or sequestration. There is severe neural foraminal  stenosis on the right and moderate neural foraminal stenosis on the left. No significant spinal canal stenosis at this level.   IMPRESSION: Multilevel degenerative disc disease and facet arthropathy, most pronounced at L5-S1 with possible disc herniation with extrusion or sequestered component resulting in severe neural foraminal stenosis on the right. Myelogram may be beneficial for further evaluation.     Electronically Signed   By: Wyvonnia Heimlich M.D.   On: 11/18/2022 02:07     Objective:  VS:  HT:    WT:   BMI:     BP:   HR: bpm  TEMP: ( )  RESP:  Physical Exam Vitals and nursing note reviewed.  Constitutional:      General: He is not in acute distress.    Appearance: Normal appearance. He is not ill-appearing.  HENT:     Head: Normocephalic and atraumatic.     Right Ear: External ear normal.     Left Ear: External ear normal.     Nose: No congestion.  Eyes:     Extraocular Movements: Extraocular movements intact.  Cardiovascular:     Rate and Rhythm: Normal rate.     Pulses: Normal pulses.  Pulmonary:     Effort: Pulmonary effort is normal. No respiratory distress.  Abdominal:     General: There is no distension.     Palpations: Abdomen is soft.  Musculoskeletal:        General: No tenderness or signs of injury.     Cervical back: Neck supple.  Right lower leg: No edema.     Left lower leg: No edema.     Comments: Patient has good distal strength without clonus.  Skin:    Findings: No erythema or rash.  Neurological:     General: No focal deficit present.     Mental Status: He is alert and oriented to person, place, and time.     Sensory: No sensory deficit.     Motor: No weakness or abnormal muscle tone.     Coordination: Coordination normal.  Psychiatric:        Mood and Affect: Mood normal.        Behavior: Behavior normal.      Imaging: No results found.

## 2023-09-30 ENCOUNTER — Ambulatory Visit: Admitting: Orthopedic Surgery

## 2023-09-30 ENCOUNTER — Encounter: Payer: Self-pay | Admitting: Orthopedic Surgery

## 2023-09-30 ENCOUNTER — Other Ambulatory Visit (INDEPENDENT_AMBULATORY_CARE_PROVIDER_SITE_OTHER): Payer: Self-pay

## 2023-09-30 VITALS — BP 115/79 | HR 108 | Ht 66.0 in | Wt 167.0 lb

## 2023-09-30 DIAGNOSIS — M5416 Radiculopathy, lumbar region: Secondary | ICD-10-CM

## 2023-09-30 MED ORDER — PREGABALIN 75 MG PO CAPS: 75.0000 mg | ORAL_CAPSULE | Freq: Two times a day (BID) | ORAL | 0 refills | Status: DC

## 2023-09-30 NOTE — Progress Notes (Signed)
 Orthopedic Spine Surgery Office Note   Assessment: Patient is an 83 y.o. male with low back pain that radiates into the right lower extremity along the lateral aspect of the thigh and leg, likely radiculopathy     Plan: -Patient has tried Tylenol , PT, gabapentin , lumbar steroid injection, oxycodone =  -Tried Lyrica as an additional nonoperative treatment to try -Recommended an MRI of the lumbar spine to evaluate for radiculopathy since he has had pain for several months now and has been getting treatment from me since August 2024 without any relief -Based on what the MRI shows, could consider injections or surgery as next treatment options -Patient should return to the office in 4 weeks, x-rays at next visit: None     Patient expressed understanding of the plan and all questions were answered to the patient's satisfaction.    ___________________________________________________________________________     History:   Patient is an 83 y.o. male who presents today for follow up on his lumbar spine.  Patient states that his pain has gotten worse since he was last seen in the office.  His back pain has improved but his leg pain has gotten worse.  He feels it along the lateral aspect of the thigh and leg.  It is most painful in the leg.  He feels the pain with activity and at rest.  He said he notes it at night and sometimes has difficulty sleeping as a result of the pain.  His pain has gotten so severe that he is now taking oxycodone  to try and help with the pain.  Continues to have nocturia but no incontinence.  No recent changes in his bowel or bladder habits.  No saddle anesthesia.   Treatments tried: Tylenol , PT, gabapentin , lumbar steroid injection, oxycodone      Physical Exam:   General: no acute distress, appears stated age Neurologic: alert, answering questions appropriately, following commands Respiratory: unlabored breathing on room air, symmetric chest rise Psychiatric: appropriate  affect, normal cadence to speech     MSK (spine):   -Strength exam                                                   Left                  Right EHL                              5/5                  4/5 TA                                 5/5                  5/5 GSC                             5/5                  5/5 Knee extension            5/5  5/5 Hip flexion                    5/5                  5/5   -Sensory exam                           Sensation intact to light touch in L3-S1 nerve distributions of bilateral lower extremities   Imaging: XRs of the lumbar spine from 09/30/2023 were independently reviewed and interpreted, showing disc height loss at L1/2, L2/3, L3/4, L4/5.  Small anterior osteophyte formation noted at all those levels as well.  No fracture or dislocation seen.  No evidence of instability on flexion/extension views.    Patient name: Joshua Ali Patient MRN: [id number] Date of visit: 09/30/23

## 2023-10-05 ENCOUNTER — Encounter: Payer: Self-pay | Admitting: Orthopedic Surgery

## 2023-10-06 ENCOUNTER — Ambulatory Visit: Admitting: Orthopedic Surgery

## 2023-10-11 ENCOUNTER — Encounter: Payer: Self-pay | Admitting: Orthopedic Surgery

## 2023-10-11 MED ORDER — OXYCODONE HCL 5 MG PO TABS: 5.0000 mg | ORAL_TABLET | ORAL | 0 refills | Status: AC | PRN

## 2023-10-11 MED ORDER — CYCLOBENZAPRINE HCL 10 MG PO TABS: 10.0000 mg | ORAL_TABLET | Freq: Three times a day (TID) | ORAL | 0 refills | Status: DC | PRN

## 2023-10-11 NOTE — Addendum Note (Signed)
 Addended by: Colette Davies on: 10/11/2023 09:34 PM   Modules accepted: Orders

## 2023-10-13 NOTE — CV Procedure (Signed)
  Device system confirmed to be MRI conditional, with implant date > 6 weeks ago, and no evidence of abandoned or epicardial leads in review of most recent CXR  Device last cleared by EP Provider:Andrew Tillery 10/13/23   Clearance is good through for 1 year as long as parameters remain stable at time of check. If pt undergoes a cardiac device procedure during that time, they should be re-cleared.   Tachy-therapies to be programmed off if applicable with device back to pre-MRI settings after completion of exam.  [employer] will be present for programming for the MRI.   Arlys Lamer, RT  10/13/2023 8:50 AM

## 2023-10-19 ENCOUNTER — Ambulatory Visit (HOSPITAL_COMMUNITY)
Admission: RE | Admit: 2023-10-19 | Discharge: 2023-10-19 | Disposition: A | Discharge status: Home / Self Care | Source: Ambulatory Visit | Attending: Orthopedic Surgery | Admitting: Orthopedic Surgery

## 2023-10-19 DIAGNOSIS — M5416 Radiculopathy, lumbar region: Secondary | ICD-10-CM | POA: Insufficient documentation

## 2023-10-26 ENCOUNTER — Ambulatory Visit (INDEPENDENT_AMBULATORY_CARE_PROVIDER_SITE_OTHER): Payer: Medicare Other

## 2023-10-26 DIAGNOSIS — I495 Sick sinus syndrome: Secondary | ICD-10-CM | POA: Diagnosis not present

## 2023-10-28 ENCOUNTER — Ambulatory Visit (INDEPENDENT_AMBULATORY_CARE_PROVIDER_SITE_OTHER): Admitting: Orthopedic Surgery

## 2023-10-28 DIAGNOSIS — M5416 Radiculopathy, lumbar region: Secondary | ICD-10-CM | POA: Diagnosis not present

## 2023-10-28 LAB — CUP PACEART REMOTE DEVICE CHECK
Battery Remaining Longevity: 93 mo
Battery Remaining Percentage: 73 %
Battery Voltage: 3.01 V
Brady Statistic AP VP Percent: 1 %
Brady Statistic AP VS Percent: 1 %
Brady Statistic AS VP Percent: 1 %
Brady Statistic AS VS Percent: 99 %
Brady Statistic RA Percent Paced: 1 %
Brady Statistic RV Percent Paced: 1 %
Date Time Interrogation Session: 20250625133806
Implantable Lead Connection Status: 753985
Implantable Lead Connection Status: 753985
Implantable Lead Implant Date: 20100903
Implantable Lead Implant Date: 20100903
Implantable Lead Location: 753859
Implantable Lead Location: 753860
Implantable Pulse Generator Implant Date: 20220325
Lead Channel Impedance Value: 410 Ohm
Lead Channel Impedance Value: 590 Ohm
Lead Channel Pacing Threshold Amplitude: 0.5 V
Lead Channel Pacing Threshold Amplitude: 1.25 V
Lead Channel Pacing Threshold Pulse Width: 0.4 ms
Lead Channel Pacing Threshold Pulse Width: 0.4 ms
Lead Channel Sensing Intrinsic Amplitude: 12 mV
Lead Channel Sensing Intrinsic Amplitude: 4.1 mV
Lead Channel Setting Pacing Amplitude: 1.5 V
Lead Channel Setting Pacing Amplitude: 1.5 V
Lead Channel Setting Pacing Pulse Width: 0.4 ms
Lead Channel Setting Sensing Sensitivity: 2 mV
Pulse Gen Model: 2272
Pulse Gen Serial Number: 3910054

## 2023-10-28 MED ORDER — OXYCODONE HCL 5 MG PO CAPS: 5.0000 mg | ORAL_CAPSULE | ORAL | 0 refills | Status: AC | PRN

## 2023-10-28 MED ORDER — PREGABALIN 75 MG PO CAPS: 75.0000 mg | ORAL_CAPSULE | Freq: Two times a day (BID) | ORAL | 0 refills | Status: DC

## 2023-10-28 NOTE — Progress Notes (Signed)
 Orthopedic Spine Surgery Office Note   Assessment: Patient is an 83 y.o. 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      Plan: -Patient has tried Tylenol , PT, gabapentin , lyrica , lumbar steroid injection, oxycodone  -Patient has now multiple conservative treatments under my care over the last 10 months without any lasting relief of his pain.  His pain has gotten the point that he is losing sleep and is taking oxycodone , so discussed surgery as a treatment option for him.  After our discussion, patient elected to proceed. -Represcribed Lyrica  and oxycodone  -Patient will next be seen at date of surgery     The patient has symptoms consistent with lumbar radiculopathy. The patient's symptoms were not improving with conservative treatment so operative management was discussed.  In order to adequately decompress the right side foramen at L4/5 and L5/S1, I discussed decompressive facetectomy involving the L4 inferior facet, the L5 superior facet, the L5 inferior facet and, the S1 superior facet (3 segments).  Due to the destabilizing nature of the decompressive facetectomy, I recommended interbody fusion at L4/5 and L5/S1 with posterior instrumentation at L4, L5, S1. The risks including but not limited to dural tear, nerve root injury, paralysis, persistent pain, pseudarthrosis, infection, bleeding, hardware failure, adjacent segment disease, heart attack, death, stroke, fracture, blindness, and need for additional procedures were discussed with the patient. The benefit of the surgery would be improvement in the patient's radiating leg pain. I explained that back pain relief is not the goal of the surgery and it is not reliably alleviated with this surgery. The alternatives to surgical management were covered with the patient and included continued monitoring, physical therapy,  over-the-counter pain medications, ambulatory aids, pain management, and activity modification. All the patient's questions were answered to his and his family's satisfaction. After this discussion, the patient expressed understanding and elected to proceed with surgical intervention.      ___________________________________________________________________________     History:   Patient is an 83 y.o. male who presents today for follow up on his lumbar spine.  He states the pain continues to be severe in nature.  He said he is having more leg pain and back pain.  He feels the pain along the lateral aspect of his thigh and leg.  He feels the pain in the right leg.  He says he has trouble sleeping at night as well for the pain.  There are some nights where he does not get any sleep due to the pain.  He has tried multiple conservative treatments but has not noticed lasting relief with any of them.  He has been lately trying to use oxycodone  and Lyrica  to control the pain but it is still severe.  He has not noticed any new symptoms since he was last seen in the office.   Physical Exam:   General: no acute distress, appears stated age Neurologic: alert, answering questions appropriately, following commands Respiratory: unlabored breathing on room air, symmetric chest rise Psychiatric: appropriate affect, normal cadence to speech     MSK (spine):   -Strength exam                                                   Left  Right EHL                              5/5                  4/5 TA                                 5/5                  5/5 GSC                             5/5                  5/5 Knee extension            5/5                  5/5 Hip flexion                    5/5                  5/5   -Sensory exam                           Sensation intact to light touch in L3-S1 nerve distributions of bilateral lower extremities   Imaging: XRs of the lumbar spine from  09/30/2023 were independently reviewed and interpreted, showing disc height loss at L1/2, L2/3, L3/4, L4/5.  Small anterior osteophyte formation noted at all those levels as well.  No fracture or dislocation seen.  No evidence of instability on flexion/extension views.  MRI of the lumbar spine from 10/19/2023 was independently reviewed and interpreted, showing left-sided lateral recess stenosis at L4/5.  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     Patient name: Joshua Ali Patient MRN: [id number] Date of visit: 10/28/23

## 2023-10-29 ENCOUNTER — Telehealth: Payer: Self-pay | Admitting: *Deleted

## 2023-10-29 NOTE — Telephone Encounter (Signed)
   Pre-operative Risk Assessment    Patient Name: Joshua Ali  DOB: October 08, 1940 MRN: [id number]   Date of last office visit: 01/14/23 DR. KLEIN Date of next office visit: NONE   Request for Surgical Clearance    Procedure:  L4-S1 TRANSFORAMINAL LUMBAR INTERBODY FUSION AND POSTERIOR INSTRUMENTED SPINAL FUSION  Date of Surgery:  Clearance TBD                                Surgeon:  DR. OZELL ADA Surgeon's Group or Practice Name:  [facility] AT [facility] Phone number:  [phone] Fax number:  [phone] ATTN: APRIL   Type of Clearance Requested:   - Medical  - Pharmacy:  Hold Aspirin x 7 DAYS PRIOR   Type of Anesthesia:  General    Additional requests/questions:    Signed, Lielle Vandervort   10/29/2023, 12:14 PM

## 2023-10-29 NOTE — Telephone Encounter (Signed)
   Name: Joshua Ali  DOB: [birth year]  MRN: [id number]  Primary Cardiologist: None   Preoperative team, please contact this patient and set up a phone call appointment for further preoperative risk assessment. Please obtain consent and complete medication review. Thank you for your help. Not seen since 01/14/2023 by Dr. Fernande.   I confirm that guidance regarding antiplatelet and oral anticoagulation therapy has been completed and, if necessary, noted below.  Per office protocol, if patient is without any new symptoms or concerns at the time of their virtual visit, he may hold aspirin for 7  days prior to procedure. Please resume aspirin as soon as possible postprocedure, at the discretion of the surgeon.    I also confirmed the patient resides in the state of [place] . As per [facility] Medical Board telemedicine laws, the patient must reside in the state in which the provider is licensed.   Lamarr Satterfield, NP 10/29/2023, 3:08 PM [facility]

## 2023-10-29 NOTE — Telephone Encounter (Signed)
 Tried calling patient no answer left a detailed vm to call back and schedule televisit

## 2023-11-01 NOTE — Telephone Encounter (Signed)
 Spouse returning call, requesting cb

## 2023-11-02 ENCOUNTER — Telehealth: Payer: Self-pay

## 2023-11-02 NOTE — Telephone Encounter (Signed)
 Pt is scheduled for a tele appt on 11/11/23 with Katlyn West, NP. Med rec and consent are done. Pts wife was also on the call today. She stated the number under pts name is her cell phone number and she is his technical support, so she will also be attending and helping with the tele appt.

## 2023-11-02 NOTE — Telephone Encounter (Signed)
 Pt is scheduled for a tele appt on 11/11/23 with Katlyn West, NP. Med rec and consent are done. Pts wife was also on the call today. She stated the number under pts name is her cell phone number and she is his technical support, so she will also be attending and helping with the tele appt.     Patient Consent for Virtual Visit        ROLF FELLS has provided verbal consent on 11/02/2023 for a virtual visit (video or telephone).   CONSENT FOR VIRTUAL VISIT FOR:  Christopher LELON Fireman  By participating in this virtual visit I agree to the following:  I hereby voluntarily request, consent and authorize [facility] and its employed or contracted physicians, physician assistants, nurse practitioners or other licensed health care professionals (the Practitioner), to provide me with telemedicine health care services (the "Services) as deemed necessary by the treating Practitioner. I acknowledge and consent to receive the Services by the Practitioner via telemedicine. I understand that the telemedicine visit will involve communicating with the Practitioner through live audiovisual communication technology and the disclosure of certain medical information by electronic transmission. I acknowledge that I have been given the opportunity to request an in-person assessment or other available alternative prior to the telemedicine visit and am voluntarily participating in the telemedicine visit.  I understand that I have the right to withhold or withdraw my consent to the use of telemedicine in the course of my care at any time, without affecting my right to future care or treatment, and that the Practitioner or I may terminate the telemedicine visit at any time. I understand that I have the right to inspect all information obtained and/or recorded in the course of the telemedicine visit and may receive copies of available information for a reasonable fee.  I understand that some of the potential risks of  receiving the Services via telemedicine include:  Delay or interruption in medical evaluation due to technological equipment failure or disruption; Information transmitted may not be sufficient (e.g. poor resolution of images) to allow for appropriate medical decision making by the Practitioner; and/or  In rare instances, security protocols could fail, causing a breach of personal health information.  Furthermore, I acknowledge that it is my responsibility to provide information about my medical history, conditions and care that is complete and accurate to the best of my ability. I acknowledge that Practitioner's advice, recommendations, and/or decision may be based on factors not within their control, such as incomplete or inaccurate data provided by me or distortions of diagnostic images or specimens that may result from electronic transmissions. I understand that the practice of medicine is not an exact science and that Practitioner makes no warranties or guarantees regarding treatment outcomes. I acknowledge that a copy of this consent can be made available to me via my patient portal [facility] MyChart), or I can request a printed copy by calling the office of [facility].    I understand that my insurance will be billed for this visit.   I have read or had this consent read to me. I understand the contents of this consent, which adequately explains the benefits and risks of the Services being provided via telemedicine.  I have been provided ample opportunity to ask questions regarding this consent and the Services and have had my questions answered to my satisfaction. I give my informed consent for the services to be provided through the use of telemedicine in my medical care

## 2023-11-11 ENCOUNTER — Encounter: Payer: Self-pay | Admitting: Cardiovascular Disease

## 2023-11-11 ENCOUNTER — Ambulatory Visit: Attending: Cardiology | Admitting: Cardiology

## 2023-11-11 DIAGNOSIS — Z01810 Encounter for preprocedural cardiovascular examination: Secondary | ICD-10-CM
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
## 2023-11-11 NOTE — Progress Notes (Signed)
 PERIOPERATIVE PRESCRIPTION FOR IMPLANTED CARDIAC DEVICE PROGRAMMING  Patient Information: Name:  Joshua Ali  DOB:  February 19, 1941  MRN:  [id number]  Procedure:  L4-S1 TRANSFORAMINAL LUMBAR INTERBODY FUSION AND POSTERIOR INSTRUMENTED SPINAL FUSION   Date of Surgery:  Clearance TBD                                  Surgeon:  DR. OZELL ADA Surgeon's Group or Practice Name:  [facility] CARE AT [facility] Phone number:  [phone] Fax number:  [phone] ATTN: APRIL   Type of Clearance Requested:   - Medical  - Pharmacy:  Hold Aspirin x 7 DAYS PRIOR   Type of Anesthesia:  General  Device Information:  Clinic EP Physician:  Dr. Eulas Furbish  Device Type:  Pacemaker Manufacturer and Phone #:  St. Jude/Abbott: [phone] Pacemaker Dependent?:  No. Date of Last Device Check:  10/27/23 Normal Device Function?:  Yes.    Electrophysiologist's Recommendations:  Have magnet available. Provide continuous ECG monitoring when magnet is used or reprogramming is to be performed.  IF PATIENT IN PRONE POSITION: Procedure will likely interfere with device function.  Device should be programmed:  Asynchronous pacing during procedure and returned to normal programming after procedure HAVE ABBOTT DEVICE REP PRESENT TO REPROGRAM IF NEEDED  Per Device Clinic Standing Orders, Prentice JINNY Silvan, RN  11:45 AM 11/11/2023

## 2023-11-15 ENCOUNTER — Encounter: Payer: Self-pay | Admitting: Orthopedic Surgery

## 2023-11-15 MED ORDER — OXYCODONE HCL 5 MG PO CAPS: 5.0000 mg | ORAL_CAPSULE | ORAL | 0 refills | Status: AC | PRN

## 2023-12-01 ENCOUNTER — Encounter: Payer: Self-pay | Admitting: Orthopedic Surgery

## 2023-12-01 MED ORDER — OXYCODONE HCL 5 MG PO TABS
5.0000 mg | ORAL_TABLET | ORAL | 0 refills | Status: AC | PRN
Start: 2023-12-01 — End: 2023-12-06

## 2023-12-01 MED ORDER — PREGABALIN 75 MG PO CAPS
75.0000 mg | ORAL_CAPSULE | Freq: Two times a day (BID) | ORAL | 0 refills | Status: AC
Start: 2023-12-01 — End: 2024-01-28

## 2024-01-06 ENCOUNTER — Encounter: Payer: Self-pay | Admitting: Physician Assistant

## 2024-01-10 ENCOUNTER — Encounter: Payer: Self-pay | Admitting: Orthopedic Surgery

## 2024-01-11 ENCOUNTER — Ambulatory Visit (HOSPITAL_COMMUNITY): Admission: RE | Admit: 2024-01-11 | Source: Home / Self Care | Admitting: Orthopedic Surgery

## 2024-01-11 ENCOUNTER — Encounter: Admission: RE | Payer: Self-pay | Source: Home / Self Care

## 2024-01-11 DIAGNOSIS — Z01818 Encounter for other preprocedural examination: Secondary | ICD-10-CM

## 2024-01-11 SURGERY — TRANSFORAMINAL LUMBAR INTERBODY FUSION (TLIF) WITH PEDICLE SCREW FIXATION 1 LEVEL
Anesthesia: General

## 2024-01-14 NOTE — Telephone Encounter (Signed)
 I spoke with the patient on 01/13/24 and rescheduled surgery for 02/22/24.

## 2024-01-20 ENCOUNTER — Encounter: Admitting: Orthopedic Surgery

## 2024-01-25 ENCOUNTER — Ambulatory Visit (INDEPENDENT_AMBULATORY_CARE_PROVIDER_SITE_OTHER): Payer: Medicare Other

## 2024-01-25 DIAGNOSIS — I495 Sick sinus syndrome: Secondary | ICD-10-CM

## 2024-01-26 LAB — CUP PACEART REMOTE DEVICE CHECK
Battery Remaining Longevity: 89 mo
Battery Remaining Percentage: 71 %
Battery Voltage: 3.01 V
Brady Statistic AP VP Percent: 1 %
Brady Statistic AP VS Percent: 1 %
Brady Statistic AS VP Percent: 1 %
Brady Statistic AS VS Percent: 99 %
Brady Statistic RA Percent Paced: 1 %
Brady Statistic RV Percent Paced: 1 %
Date Time Interrogation Session: 20250923020012
Implantable Lead Connection Status: 753985
Implantable Lead Connection Status: 753985
Implantable Lead Implant Date: 20100903
Implantable Lead Implant Date: 20100903
Implantable Lead Location: 753859
Implantable Lead Location: 753860
Implantable Pulse Generator Implant Date: 20220325
Lead Channel Impedance Value: 410 Ohm
Lead Channel Impedance Value: 630 Ohm
Lead Channel Pacing Threshold Amplitude: 0.5 V
Lead Channel Pacing Threshold Amplitude: 1.125 V
Lead Channel Pacing Threshold Pulse Width: 0.4 ms
Lead Channel Pacing Threshold Pulse Width: 0.4 ms
Lead Channel Sensing Intrinsic Amplitude: 12 mV
Lead Channel Sensing Intrinsic Amplitude: 5 mV
Lead Channel Setting Pacing Amplitude: 1.375
Lead Channel Setting Pacing Amplitude: 1.5 V
Lead Channel Setting Pacing Pulse Width: 0.4 ms
Lead Channel Setting Sensing Sensitivity: 2 mV
Pulse Gen Model: 2272
Pulse Gen Serial Number: 3910054

## 2024-01-26 NOTE — Progress Notes (Signed)
 Remote PPM Transmission

## 2024-01-27 NOTE — Progress Notes (Unsigned)
  Electrophysiology Office Note:    Date:  01/28/2024   ID:  Joshua Ali, Joshua Ali Nov 25, 1940, MRN [id number]  PCP:  Meredeth Prentice KIDD, MD   [facility] Providers Cardiologist:  Elspeth Sage, MD     Referring MD: Burnard Elsie RAMAN, MD   History of Present Illness:    Joshua Ali is a 83 y.o. male with a medical history significant for [facility] Jude dual-chamber pacemaker due to paroxysmal bradycardia, syncope, type 2 diabetes, hypertension who presents for device follow-up.      Discussed the use of AI scribe software for clinical note transcription with the patient, who gave verbal consent to proceed.  History of Present Illness  He had a Saint Jude pacemaker placed in 2010 after an episode of syncope associated with a documented pause of greater than 30 seconds.  Previously, he had an episode of presyncope with a stereotypical prodrome.  He was thought to have a neuro cardiogenic cause for these episodes.  He underwent generator change in March 2022  Dr. Celine notes mention that the patient's son died abruptly, without warning on Thanksgiving day in 2020.  This was also his birthday.        Today, he reports that he is well. he has no device related complaints -- no new tenderness, drainage, redness.   EKGs/Labs/Other Studies Reviewed Today:     Echocardiogram:  TTE October 2016 LVEF 55 to 60%.  Normal wall motion.  The ascending aorta is at the upper level of normal in size.  Atrium normal in size.    EKG:   EKG Interpretation Date/Time:  Friday January 28 2024 10:20:32 EDT Ventricular Rate:  115 PR Interval:  188 QRS Duration:  76 QT Interval:  316 QTC Calculation: 437 R Axis:   52  Text Interpretation: Sinus tachycardia with Premature atrial complexes When compared with ECG of 14-Jan-2023 15:43, Premature atrial complexes are now Present Confirmed by Nancey Scotts [phone]) on 01/28/2024 11:02:19 AM     Physical Exam:    VS:  BP 122/60   Pulse (!)  115   Ht 5' 7 (1.702 m)   Wt 170 lb 8 oz (77.3 kg)   SpO2 96%   BMI 26.70 kg/m     Wt Readings from Last 3 Encounters:  01/28/24 170 lb 8 oz (77.3 kg)  09/30/23 167 lb (75.8 kg)  01/20/23 167 lb (75.8 kg)     GEN:  Well nourished, well developed in no acute distress CARDIAC: RRR, no murmurs, rubs, gallops The device site is normal -- no tenderness, edema, drainage, redness, threatened erosion.  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  See Paceart for details Device is functioning normally  Preoperative evaluation - L4, L5 He has good exercise tolerance --he reports that he is able to climb up 2 flights of stairs pretty easily We will update echocardiogram and follow-up pending the results  Atrial tachycardia, frequent PACs Asymptomatic Continue to monitor with device    Signed, Scotts FORBES Nancey, MD  01/28/2024 11:15 AM     [facility]

## 2024-01-28 ENCOUNTER — Ambulatory Visit: Attending: Cardiovascular Disease | Admitting: Cardiovascular Disease

## 2024-01-28 ENCOUNTER — Encounter: Payer: Self-pay | Admitting: Cardiovascular Disease

## 2024-01-28 VITALS — BP 122/60 | HR 115 | Ht 67.0 in | Wt 170.5 lb

## 2024-01-28 DIAGNOSIS — I4719 Other supraventricular tachycardia: Secondary | ICD-10-CM | POA: Diagnosis not present

## 2024-01-28 DIAGNOSIS — Z950 Presence of cardiac pacemaker: Secondary | ICD-10-CM | POA: Diagnosis not present

## 2024-01-28 DIAGNOSIS — R55 Syncope and collapse: Secondary | ICD-10-CM

## 2024-01-28 LAB — CUP PACEART INCLINIC DEVICE CHECK
Battery Remaining Longevity: 90 mo
Battery Voltage: 3.01 V
Brady Statistic RA Percent Paced: 0.02 %
Brady Statistic RV Percent Paced: 0.15 %
Date Time Interrogation Session: 20250926111731
Implantable Lead Connection Status: 753985
Implantable Lead Connection Status: 753985
Implantable Lead Implant Date: 20100903
Implantable Lead Implant Date: 20100903
Implantable Lead Location: 753859
Implantable Lead Location: 753860
Implantable Pulse Generator Implant Date: 20220325
Lead Channel Impedance Value: 425 Ohm
Lead Channel Impedance Value: 612.5 Ohm
Lead Channel Pacing Threshold Amplitude: 0.5 V
Lead Channel Pacing Threshold Amplitude: 0.5 V
Lead Channel Pacing Threshold Amplitude: 1 V
Lead Channel Pacing Threshold Amplitude: 1 V
Lead Channel Pacing Threshold Pulse Width: 0.4 ms
Lead Channel Pacing Threshold Pulse Width: 0.4 ms
Lead Channel Pacing Threshold Pulse Width: 0.4 ms
Lead Channel Pacing Threshold Pulse Width: 0.4 ms
Lead Channel Sensing Intrinsic Amplitude: 12 mV
Lead Channel Sensing Intrinsic Amplitude: 5 mV
Lead Channel Setting Pacing Amplitude: 1.375
Lead Channel Setting Pacing Amplitude: 1.5 V
Lead Channel Setting Pacing Pulse Width: 0.4 ms
Lead Channel Setting Sensing Sensitivity: 2 mV
Pulse Gen Model: 2272
Pulse Gen Serial Number: 3910054

## 2024-01-28 NOTE — Patient Instructions (Addendum)
 Medication Instructions:  Your physician recommends that you continue on your current medications as directed. Please refer to the Current Medication list given to you today. *If you need a refill on your cardiac medications before your next appointment, please call your pharmacy*  Lab Work: None ordered. If you have labs (blood work) drawn today and your tests are completely normal, you will receive your results only by: MyChart Message (if you have MyChart) OR A paper copy in the mail If you have any lab test that is abnormal or we need to change your treatment, we will call you to review the results.  Testing/Procedures: Your physician has requested that you have an echocardiogram.   Echocardiography is a painless test that uses sound waves to create images of your heart. It provides your doctor with information about the size and shape of your heart and how well your heart's chambers and valves are working. This procedure takes approximately one hour. There are no restrictions for this procedure. Please do NOT wear cologne, perfume, aftershave, or lotions (deodorant is allowed). Please arrive 15 minutes prior to your appointment time.  Please note: We ask at that you not bring children with you during ultrasound (echo/ vascular) testing. Due to room size and safety concerns, children are not allowed in the ultrasound rooms during exams. Our front office staff cannot provide observation of children in our lobby area while testing is being conducted. An adult accompanying a patient to their appointment will only be allowed in the ultrasound room at the discretion of the ultrasound technician under special circumstances. We apologize for any inconvenience.   Follow-Up: At [facility], you and your health needs are our priority.  As part of our continuing mission to provide you with exceptional heart care, our providers are all part of one team.  This team includes your primary  Cardiologist (physician) and Advanced Practice Providers or APPs (Physician Assistants and Nurse Practitioners) who all work together to provide you with the care you need, when you need it.  Your next appointment:   1 year(s)  Provider:   You will see one of the following Advanced Practice Providers on your designated Care Team:   Charlies Arthur, [place] Ozell Jodie Passey, PA-C Suzann Riddle, NP Daphne Barrack, NP Artist Pouch, PA-C   We recommend signing up for the patient portal called MyChart.  Sign up information is provided on this After Visit Summary.  MyChart is used to connect with patients for Virtual Visits (Telemedicine).  Patients are able to view lab/test results, encounter notes, upcoming appointments, etc.  Non-urgent messages can be sent to your provider as well.   To learn more about what you can do with MyChart, go to [email or web address].

## 2024-02-15 NOTE — Pre-Procedure Instructions (Signed)
 Surgical Instructions   Your procedure is scheduled on February 22, 2024. Report to [facility] Main Entrance A at 5:30 A.M., then check in with the Admitting office. Any questions or running late day of surgery: call [phone]  Questions prior to your surgery date: call [phone], Monday-Friday, 8am-4pm. If you experience any cold or flu symptoms such as cough, fever, chills, shortness of breath, etc. between now and your scheduled surgery, please notify us  at the above number.     Remember:  Do not eat after midnight the night before your surgery  You may drink clear liquids until 4:30 AM the morning of your surgery.   Clear liquids allowed are: Water, Non-Citrus Juices (without pulp), Carbonated Beverages, Clear Tea (no milk, honey, etc.), Black Coffee Only (NO MILK, CREAM OR POWDERED CREAMER of any kind), and Gatorade.  Patient Instructions  The night before surgery:  No food after midnight. ONLY clear liquids after midnight  The day of surgery (if you have diabetes): Drink ONE (1) 12 oz G2 given to you in your pre admission testing appointment by 4:30 AM the morning of surgery. Drink in one sitting. Do not sip.  This drink was given to you during your hospital  pre-op appointment visit.  Nothing else to drink after completing the  12 oz bottle of G2.         If you have questions, please contact your surgeon's office.    Take these medicines the morning of surgery with A SIP OF WATER: amLODipine  (NORVASC )  cetirizine (ZYRTEC)  diltiazem  (CARDIZEM  SR)  finasteride (PROSCAR)  tamsulosin (FLOMAX)    May take these medicines IF NEEDED: acetaminophen  (TYLENOL )  fluticasone (FLONASE) nasal spray  Povidone, PF, (IVIZIA DRY EYES) eye drops   Follow your surgeon's instructions on when to stop Aspirin.  If no instructions were given by your surgeon then you will need to call the office to get those instructions.     One week prior to surgery, STOP taking any Aleve,  Naproxen, Ibuprofen, Motrin, Advil, Goody's, BC's, all herbal medications, fish oil, and non-prescription vitamins.   WHAT DO I DO ABOUT MY DIABETES MEDICATION?   Do not take glimepiride (AMARYL) or metFORMIN (GLUCOPHAGE) the morning of surgery.  STOP taking your RYBELSUS 24 hours prior to surgery. DO NOT take any doses after 6:00am on October 20th.       HOW TO MANAGE YOUR DIABETES BEFORE AND AFTER SURGERY  Why is it important to control my blood sugar before and after surgery? Improving blood sugar levels before and after surgery helps healing and can limit problems. A way of improving blood sugar control is eating a healthy diet by:  Eating less sugar and carbohydrates  Increasing activity/exercise  Talking with your doctor about reaching your blood sugar goals High blood sugars (greater than 180 mg/dL) can raise your risk of infections and slow your recovery, so you will need to focus on controlling your diabetes during the weeks before surgery. Make sure that the doctor who takes care of your diabetes knows about your planned surgery including the date and location.  How do I manage my blood sugar before surgery? Check your blood sugar at least 4 times a day, starting 2 days before surgery, to make sure that the level is not too high or low.  Check your blood sugar the morning of your surgery when you wake up and every 2 hours until you get to the Short Stay unit.  If your blood sugar is  less than 70 mg/dL, you will need to treat for low blood sugar: Do not take insulin . Treat a low blood sugar (less than 70 mg/dL) with  cup of clear juice (cranberry or apple), 4 glucose tablets, OR glucose gel. Recheck blood sugar in 15 minutes after treatment (to make sure it is greater than 70 mg/dL). If your blood sugar is not greater than 70 mg/dL on recheck, call [phone] for further instructions. Report your blood sugar to the short stay nurse when you get to Short Stay.  If you are  admitted to the hospital after surgery: Your blood sugar will be checked by the staff and you will probably be given insulin  after surgery (instead of oral diabetes medicines) to make sure you have good blood sugar levels. The goal for blood sugar control after surgery is 80-180 mg/dL.                      Do NOT Smoke (Tobacco/Vaping) for 24 hours prior to your procedure.  If you use a CPAP at night, you may bring your mask/headgear for your overnight stay.   You will be asked to remove any contacts, glasses, piercing's, hearing aid's, dentures/partials prior to surgery. Please bring cases for these items if needed.    Patients discharged the day of surgery will not be allowed to drive home, and someone needs to stay with them for 24 hours.  SURGICAL WAITING ROOM VISITATION Patients may have no more than 2 support people in the waiting area - these visitors may rotate.   Pre-op nurse will coordinate an appropriate time for 1 ADULT support person, who may not rotate, to accompany patient in pre-op.  Children under the age of 73 must have an adult with them who is not the patient and must remain in the main waiting area with an adult.  If the patient needs to stay at the hospital during part of their recovery, the visitor guidelines for inpatient rooms apply.  Please refer to the [facility] website for the visitor guidelines for any additional information.   If you received a COVID test during your pre-op visit  it is requested that you wear a mask when out in public, stay away from anyone that may not be feeling well and notify your surgeon if you develop symptoms. If you have been in contact with anyone that has tested positive in the last 10 days please notify you surgeon.      Pre-operative 4 CHG Bathing Instructions   You can play a key role in reducing the risk of infection after surgery. Your skin needs to be as free of germs as possible. You can reduce the number of germs on  your skin by washing with CHG (chlorhexidine  gluconate) soap before surgery. CHG is an antiseptic soap that kills germs and continues to kill germs even after washing.   DO NOT use if you have an allergy to chlorhexidine /CHG or antibacterial soaps. If your skin becomes reddened or irritated, stop using the CHG and notify one of our RNs at [phone].   Please shower with the CHG soap starting 4 days before surgery using the following schedule:     Please keep in mind the following:  DO NOT shave, including legs and underarms, starting the day of your first shower.   You may shave your face at any point before/day of surgery.  Place clean sheets on your bed the day you start using CHG soap. Use a  clean washcloth (not used since being washed) for each shower. DO NOT sleep with pets once you start using the CHG.   CHG Shower Instructions:  Wash your face and private area with normal soap. If you choose to wash your hair, wash first with your normal shampoo.  After you use shampoo/soap, rinse your hair and body thoroughly to remove shampoo/soap residue.  Turn the water OFF and apply  bottle of CHG soap to a CLEAN washcloth.  Apply CHG soap ONLY FROM YOUR NECK DOWN TO YOUR TOES (washing for 3-5 minutes)  DO NOT use CHG soap on face, private areas, open wounds, or sores.  Pay special attention to the area where your surgery is being performed.  If you are having back surgery, having someone wash your back for you may be helpful. Wait 2 minutes after CHG soap is applied, then you may rinse off the CHG soap.  Pat dry with a clean towel  Put on clean clothes/pajamas   If you choose to wear lotion, please use ONLY the CHG-compatible lotions that are listed below.  Additional instructions for the day of surgery:  If you choose, you may shower the morning of surgery with an antibacterial soap.  DO NOT APPLY any lotions, deodorants, cologne, or perfumes.   Do not bring valuables to the  hospital. [facility] is not responsible for any belongings/valuables. Do not wear nail polish, gel polish, artificial nails, or any other type of covering on natural nails (fingers and toes) Do not wear jewelry or makeup Put on clean/comfortable clothes.  Please brush your teeth.  Ask your nurse before applying any prescription medications to the skin.     CHG Compatible Lotions   Aveeno Moisturizing lotion  Cetaphil Moisturizing Cream  Cetaphil Moisturizing Lotion  Clairol Herbal Essence Moisturizing Lotion, Dry Skin  Clairol Herbal Essence Moisturizing Lotion, Extra Dry Skin  Clairol Herbal Essence Moisturizing Lotion, Normal Skin  Curel Age Defying Therapeutic Moisturizing Lotion with Alpha Hydroxy  Curel Extreme Care Body Lotion  Curel Soothing Hands Moisturizing Hand Lotion  Curel Therapeutic Moisturizing Cream, Fragrance-Free  Curel Therapeutic Moisturizing Lotion, Fragrance-Free  Curel Therapeutic Moisturizing Lotion, Original Formula  Eucerin Daily Replenishing Lotion  Eucerin Dry Skin Therapy Plus Alpha Hydroxy Crme  Eucerin Dry Skin Therapy Plus Alpha Hydroxy Lotion  Eucerin Original Crme  Eucerin Original Lotion  Eucerin Plus Crme Eucerin Plus Lotion  Eucerin TriLipid Replenishing Lotion  Keri Anti-Bacterial Hand Lotion  Keri Deep Conditioning Original Lotion Dry Skin Formula Softly Scented  Keri Deep Conditioning Original Lotion, Fragrance Free Sensitive Skin Formula  Keri Lotion Fast Absorbing Fragrance Free Sensitive Skin Formula  Keri Lotion Fast Absorbing Softly Scented Dry Skin Formula  Keri Original Lotion  Keri Skin Renewal Lotion Keri Silky Smooth Lotion  Keri Silky Smooth Sensitive Skin Lotion  Nivea Body Creamy Conditioning Oil  Nivea Body Extra Enriched Lotion  Nivea Body Original Lotion  Nivea Body Sheer Moisturizing Lotion Nivea Crme  Nivea Skin Firming Lotion  NutraDerm 30 Skin Lotion  NutraDerm Skin Lotion  NutraDerm Therapeutic Skin  Cream  NutraDerm Therapeutic Skin Lotion  ProShield Protective Hand Cream  Provon moisturizing lotion  Please read over the following fact sheets that you were given.

## 2024-02-16 ENCOUNTER — Other Ambulatory Visit: Payer: Self-pay

## 2024-02-16 ENCOUNTER — Encounter (HOSPITAL_COMMUNITY): Payer: Self-pay

## 2024-02-16 ENCOUNTER — Encounter (HOSPITAL_COMMUNITY)
Admission: RE | Admit: 2024-02-16 | Discharge: 2024-02-16 | Disposition: A | Discharge status: Home / Self Care | Source: Ambulatory Visit | Attending: Orthopedic Surgery | Admitting: Orthopedic Surgery

## 2024-02-16 VITALS — BP 135/55 | HR 62 | Temp 98.4°F | Resp 16 | Ht 67.0 in | Wt 171.9 lb

## 2024-02-16 DIAGNOSIS — Z7984 Long term (current) use of oral hypoglycemic drugs: Secondary | ICD-10-CM | POA: Insufficient documentation

## 2024-02-16 DIAGNOSIS — I5032 Chronic diastolic (congestive) heart failure: Secondary | ICD-10-CM | POA: Insufficient documentation

## 2024-02-16 DIAGNOSIS — Z01812 Encounter for preprocedural laboratory examination: Secondary | ICD-10-CM | POA: Insufficient documentation

## 2024-02-16 DIAGNOSIS — M5416 Radiculopathy, lumbar region: Secondary | ICD-10-CM | POA: Insufficient documentation

## 2024-02-16 DIAGNOSIS — Z9889 Other specified postprocedural states: Secondary | ICD-10-CM | POA: Diagnosis not present

## 2024-02-16 DIAGNOSIS — Z79899 Other long term (current) drug therapy: Secondary | ICD-10-CM | POA: Insufficient documentation

## 2024-02-16 DIAGNOSIS — E119 Type 2 diabetes mellitus without complications: Secondary | ICD-10-CM | POA: Insufficient documentation

## 2024-02-16 DIAGNOSIS — Z01818 Encounter for other preprocedural examination: Secondary | ICD-10-CM | POA: Diagnosis present

## 2024-02-16 DIAGNOSIS — I110 Hypertensive heart disease with heart failure: Secondary | ICD-10-CM | POA: Diagnosis not present

## 2024-02-16 DIAGNOSIS — Z7982 Long term (current) use of aspirin: Secondary | ICD-10-CM | POA: Diagnosis not present

## 2024-02-16 DIAGNOSIS — Z96651 Presence of right artificial knee joint: Secondary | ICD-10-CM | POA: Insufficient documentation

## 2024-02-16 DIAGNOSIS — Z95810 Presence of automatic (implantable) cardiac defibrillator: Secondary | ICD-10-CM | POA: Insufficient documentation

## 2024-02-16 DIAGNOSIS — E785 Hyperlipidemia, unspecified: Secondary | ICD-10-CM | POA: Insufficient documentation

## 2024-02-16 DIAGNOSIS — Z85038 Personal history of other malignant neoplasm of large intestine: Secondary | ICD-10-CM | POA: Diagnosis not present

## 2024-02-16 DIAGNOSIS — I495 Sick sinus syndrome: Secondary | ICD-10-CM | POA: Insufficient documentation

## 2024-02-16 HISTORY — DX: Cardiac arrhythmia, unspecified: I49.9

## 2024-02-16 HISTORY — DX: Presence of cardiac pacemaker: Z95.0

## 2024-02-16 LAB — GLUCOSE, CAPILLARY: Glucose-Capillary: 201 mg/dL — ABNORMAL HIGH (ref 70–99)

## 2024-02-16 LAB — CBC
HCT: 38.6 % — ABNORMAL LOW (ref 39.0–52.0)
Hemoglobin: 13 g/dL (ref 13.0–17.0)
MCH: 29.3 pg (ref 26.0–34.0)
MCHC: 33.7 g/dL (ref 30.0–36.0)
MCV: 87.1 fL (ref 80.0–100.0)
Platelets: 193 K/uL (ref 150–400)
RBC: 4.43 MIL/uL (ref 4.22–5.81)
RDW: 14 % (ref 11.5–15.5)
WBC: 5.6 K/uL (ref 4.0–10.5)
nRBC: 0 % (ref 0.0–0.2)

## 2024-02-16 LAB — BASIC METABOLIC PANEL WITH GFR
Anion gap: 13 (ref 5–15)
BUN: 23 mg/dL (ref 8–23)
CO2: 24 mmol/L (ref 22–32)
Calcium: 9.4 mg/dL (ref 8.9–10.3)
Chloride: 102 mmol/L (ref 98–111)
Creatinine, Ser: 1.11 mg/dL (ref 0.61–1.24)
GFR, Estimated: >60 mL/min (ref >60)
Glucose, Bld: 184 mg/dL — ABNORMAL HIGH (ref 70–99)
Potassium: 4.2 mmol/L (ref 3.5–5.1)
Sodium: 139 mmol/L (ref 135–145)

## 2024-02-16 LAB — TYPE AND SCREEN
ABO/RH(D): O POS
Antibody Screen: NEGATIVE

## 2024-02-16 LAB — HEMOGLOBIN A1C
Hgb A1c MFr Bld: 5.3 % (ref 4.8–5.6)
Mean Plasma Glucose: 105.41 mg/dL

## 2024-02-16 LAB — SURGICAL PCR SCREEN
MRSA, PCR: NEGATIVE
Staphylococcus aureus: NEGATIVE

## 2024-02-16 NOTE — Progress Notes (Addendum)
 PCP - Dr. Prentice Pinal Cardiologist - Dr. August Mealor (formally Dr. Fernande) - last office visit 01/28/2024  PPM/ICD - St. Jude PPM Device Orders - Device orders will be available once echo is completed 10/17 Rep Notified - Rep notified 02/16/2024 who will plan on being at the bedside morning of surgery  Chest x-ray - 12/05/2023 (CE) EKG - 01/28/2024 Stress Test - 02/25/2015 ECHO - 02/25/2015 with pending echo scheduled for 02/18/24 Cardiac Cath - Denies  Sleep Study - Denies CPAP - n/a  Pt is DM2. He does not have a glucose meter at home, therefore does not know normal fasting range. CBG at pre-op appointment 201. Pt had bacon biscuit, grits and black coffee for breakfast. A1c result pending  Last dose of GLP1 agonist- Last dose of Rybelsus was today, 10/15 GLP1 instructions: Pt instructed to not take any doses after 0600 on October 20th  Blood Thinner Instructions: n/a Aspirin Instructions: Pt has already stopped his ASA. Last dose was 10/12  ERAS Protcol - Clear liquids until 0430 morning of surgery PRE-SURGERY Ensure or G2- G2 given to pt with instructions  COVID TEST- n/a   Anesthesia review: Yes. Hx of HTN, Sick Sinus Syndrome s/p PPM in 2010, DM2. He is pending completion of echo on Friday, October 17th which will give clearance for surgery if normal. Pt was also recently admitted to [facility]/3 to 8/5 with fever/virus of unknown origin. Pt states he has fully recovered from this encounter.   Patient denies shortness of breath, fever, cough and chest pain at PAT appointment. Pt denies any respiratory illness/infection in the last two months.   All instructions explained to the patient, with a verbal understanding of the material. Patient agrees to go over the instructions while at home for a better understanding. Patient also instructed to self quarantine after being tested for COVID-19. The opportunity to ask questions was provided.

## 2024-02-17 NOTE — Anesthesia Preprocedure Evaluation (Addendum)
 Anesthesia Evaluation  Patient identified by MRN, date of birth, ID band Patient awake    Reviewed: Allergy & Precautions, NPO status , Patient's Chart, lab work & pertinent test results  History of Anesthesia Complications Negative for: history of anesthetic complications  Airway Mallampati: II  TM Distance: >3 FB Neck ROM: Full    Dental no notable dental hx. (+) Teeth Intact   Pulmonary neg pulmonary ROS, neg sleep apnea, neg COPD, Patient abstained from smoking.Not current smoker, former smoker   Pulmonary exam normal breath sounds clear to auscultation       Cardiovascular Exercise Tolerance: Good METShypertension, Pt. on medications (-) CAD and (-) Past MI + dysrhythmias + pacemaker  Rhythm:Regular Rate:Normal - Systolic murmurs  Per pacer rep, patient is 0% paced currently and no magnet indicated.   Neuro/Psych negative neurological ROS  negative psych ROS   GI/Hepatic ,neg GERD  ,,(+)     (-) substance abuse    Endo/Other  diabetes, Well Controlled  Rybelsus not taken for more than 24hrs. Denies GI symptoms today  Renal/GU negative Renal ROS     Musculoskeletal  (+) Arthritis ,    Abdominal   Peds  Hematology   Anesthesia Other Findings Past Medical History: No date: Arthritis No date: Cancer (HCC)     Comment:  Colo-rectal Cancer No date: Cardiac pacemaker in situ No date: Diabetes mellitus     Comment:  type II No date: Diastolic dysfunction     Comment:  chronic No date: Dysrhythmia     Comment:  Sick Sinus Syndrome s/p PPM No date: History of colon cancer No date: Hyperlipidemia No date: Hypertension No date: Presence of permanent cardiac pacemaker     Comment:  St. Jude PPM No date: Sick sinus syndrome (HCC) No date: Syncope  Reproductive/Obstetrics                              Anesthesia Physical Anesthesia Plan  ASA: 2  Anesthesia Plan: General    Post-op Pain Management: Tylenol  PO (pre-op)* and Dilaudid  IV   Induction: Intravenous  PONV Risk Score and Plan: 3 and Ondansetron , Dexamethasone, TIVA and Propofol  infusion  Airway Management Planned: Oral ETT  Additional Equipment: None  Intra-op Plan:   Post-operative Plan: Extubation in OR  Informed Consent: I have reviewed the patients History and Physical, chart, labs and discussed the procedure including the risks, benefits and alternatives for the proposed anesthesia with the patient or authorized representative who has indicated his/her understanding and acceptance.     Dental advisory given  Plan Discussed with: CRNA and Surgeon  Anesthesia Plan Comments: (Discussed risks of anesthesia with patient, including PONV, sore throat, lip/dental/eye damage. Rare risks discussed as well, such as cardiorespiratory and neurological sequelae, and allergic reactions. Discussed the role of CRNA in patient's perioperative care. Patient understands.)         Anesthesia Quick Evaluation
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
## 2024-02-18 ENCOUNTER — Ambulatory Visit (HOSPITAL_COMMUNITY)
Admission: RE | Admit: 2024-02-18 | Discharge: 2024-02-18 | Disposition: A | Discharge status: Home / Self Care | Source: Ambulatory Visit | Attending: Cardiovascular Disease | Admitting: Cardiovascular Disease

## 2024-02-18 DIAGNOSIS — I4719 Other supraventricular tachycardia: Secondary | ICD-10-CM | POA: Insufficient documentation

## 2024-02-18 DIAGNOSIS — I10 Essential (primary) hypertension: Secondary | ICD-10-CM

## 2024-02-18 DIAGNOSIS — R55 Syncope and collapse: Secondary | ICD-10-CM

## 2024-02-18 DIAGNOSIS — Z950 Presence of cardiac pacemaker: Secondary | ICD-10-CM | POA: Diagnosis present

## 2024-02-18 LAB — ECHOCARDIOGRAM COMPLETE
AR max vel: 2.44 cm2
AV Area VTI: 2.33 cm2
AV Area mean vel: 2.39 cm2
AV Mean grad: 2 mmHg
AV Peak grad: 4.3 mmHg
Ao pk vel: 1.04 m/s
S' Lateral: 3.34 cm

## 2024-02-21 ENCOUNTER — Encounter: Payer: Self-pay | Admitting: Cardiovascular Disease

## 2024-02-21 NOTE — H&P (Signed)
 Orthopedic Spine Surgery H&P Note  Assessment: Patient is a 83 y.o. male with lumbar radiculopathy   Plan: -Out of bed as tolerated, activity as tolerated, no brace -Covered the risks of surgery one more time with the patient and patient elected to proceed with planned surgery -Written consent verified -Hold anticoagulation in anticipation of surgery -Ancef  and TXA on all to OR -NPO for procedure -Site marked -To OR when ready  The risks covered this morning included but were not limited to: dural tear, nerve root injury, paralysis, persistent pain, pseudarthrosis, infection, bleeding, hardware failure, adjacent segment disease, heart attack, death, stroke, fracture, blindness, and need for additional procedures.   ___________________________________________________________________________  Chief Complaint: low back pain that radiates into right leg  History: Patient is 83 y.o. male who has been previously seen in the office for low back pain that radiates into the right lower extremity. Work up was consistent with lumbar radiculopathy. His symptoms failed to improve with conservative treatment so operative management was discussed at the last office visit. The patient presents today with no changes in his symptoms since the last office visit. See previous office note for further details.    Review of systems: General: denies fevers and chills, myalgias Neurologic: denies recent changes in vision, slurred speech Abdomen: denies nausea, vomiting, hematemesis Respiratory: denies cough, shortness of breath  Past medical history: Osteoarthritis DM History of colon cancer Sick sinus syndrome HLD Chronic diastolic dysfunction HTN   Allergies: Percocet   Past surgical history:  Cholecystectomy Colostomy Pacemaker insertion Multiple right leg surgeries for fractures Right TKA Cataract surgery   Social history: Denies use of nicotine product (smoking, vaping, patches,  smokeless) Alcohol use: Yes, about 2 drinks per week Denies recreational drug use  Family history: -reviewed and not pertinent to lumbar radiculopathy   Physical Exam:  General: no acute distress, appears stated age Neurologic: alert, answering questions appropriately, following commands Cardiovascular: regular rate, no cyanosis Respiratory: unlabored breathing on room air, symmetric chest rise Psychiatric: appropriate affect, normal cadence to speech   MSK (spine):  -Strength exam      Left  Right  EHL    5/5  4/5 TA    5/5  5/5 GSC    5/5  5/5 Knee extension  5/5  5/5 Knee flexion   5/5  5/5 Hip flexion   5/5  5/5  -Sensory exam    Sensation intact to light touch in L3-S1 nerve distributions of bilateral lower extremities   Patient name: Joshua Ali Patient MRN: [id number] Date: 02/22/2024

## 2024-02-21 NOTE — Progress Notes (Signed)
 PERIOPERATIVE PRESCRIPTION FOR IMPLANTED CARDIAC DEVICE PROGRAMMING  Patient Information: Name:  Joshua Ali  DOB:  02/16/1941  MRN:  [id number]   Planned Procedure:  Transforaminal Lumbar Interbody Fusion - 2 Levels  Surgeon:  Dr. Dominique Ada  Date of Procedure:  February 22, 2024  Cautery will be used.  Position during surgery:  Prone   Device Information:  Clinic EP Physician:  Dr. Eulas Furbish  Device Type:  Pacemaker Manufacturer and Phone #:  St. Jude/Abbott: [phone] Pacemaker Dependent?:  No. Date of Last Device Check:  01/28/2024 Normal Device Function?:  Yes.    Electrophysiologist's Recommendations:  Have magnet available. Provide continuous ECG monitoring when magnet is used or reprogramming is to be performed.  Procedure should not interfere with device function.  No device programming or magnet placement needed.    Per Device Clinic Standing Orders, [name] Sharps, RN  8:59 AM 02/21/2024

## 2024-02-22 ENCOUNTER — Other Ambulatory Visit: Payer: Self-pay

## 2024-02-22 ENCOUNTER — Encounter (HOSPITAL_COMMUNITY)
Admission: RE | Disposition: A | Discharge status: Home / Self Care | Payer: Self-pay | Source: Home / Self Care | Attending: Orthopedic Surgery

## 2024-02-22 ENCOUNTER — Ambulatory Visit (HOSPITAL_COMMUNITY)

## 2024-02-22 ENCOUNTER — Encounter (HOSPITAL_COMMUNITY): Payer: Self-pay | Admitting: Orthopedic Surgery

## 2024-02-22 ENCOUNTER — Ambulatory Visit (HOSPITAL_BASED_OUTPATIENT_CLINIC_OR_DEPARTMENT_OTHER): Admitting: Vascular Surgery

## 2024-02-22 ENCOUNTER — Observation Stay (HOSPITAL_COMMUNITY)
Admission: RE | Admit: 2024-02-22 | Discharge: 2024-02-23 | Disposition: A | Discharge status: Home / Self Care | Attending: Orthopedic Surgery | Admitting: Orthopedic Surgery

## 2024-02-22 ENCOUNTER — Ambulatory Visit (HOSPITAL_COMMUNITY): Payer: Self-pay | Admitting: Vascular Surgery

## 2024-02-22 DIAGNOSIS — M5416 Radiculopathy, lumbar region: Secondary | ICD-10-CM

## 2024-02-22 DIAGNOSIS — Z87891 Personal history of nicotine dependence: Secondary | ICD-10-CM | POA: Diagnosis not present

## 2024-02-22 DIAGNOSIS — I10 Essential (primary) hypertension: Secondary | ICD-10-CM | POA: Diagnosis not present

## 2024-02-22 DIAGNOSIS — E119 Type 2 diabetes mellitus without complications: Secondary | ICD-10-CM | POA: Diagnosis not present

## 2024-02-22 DIAGNOSIS — M48061 Spinal stenosis, lumbar region without neurogenic claudication: Secondary | ICD-10-CM | POA: Insufficient documentation

## 2024-02-22 DIAGNOSIS — Z01818 Encounter for other preprocedural examination: Principal | ICD-10-CM

## 2024-02-22 DIAGNOSIS — F1090 Alcohol use, unspecified, uncomplicated: Secondary | ICD-10-CM | POA: Insufficient documentation

## 2024-02-22 HISTORY — PX: TRANSFORAMINAL LUMBAR INTERBODY FUSION (TLIF) WITH PEDICLE SCREW FIXATION 2 LEVEL: SHX6142

## 2024-02-22 LAB — GLUCOSE, CAPILLARY
Glucose-Capillary: 154 mg/dL — ABNORMAL HIGH (ref 70–99)
Glucose-Capillary: 244 mg/dL — ABNORMAL HIGH (ref 70–99)
Glucose-Capillary: 177 mg/dL — ABNORMAL HIGH (ref 70–99)
Glucose-Capillary: 221 mg/dL — ABNORMAL HIGH (ref 70–99)

## 2024-02-22 SURGERY — TRANSFORAMINAL LUMBAR INTERBODY FUSION (TLIF) WITH PEDICLE SCREW FIXATION 2 LEVEL
Anesthesia: General | Site: Spine Lumbar

## 2024-02-22 MED ORDER — ADULT MULTIVITAMIN W/MINERALS CH
1.0000 | ORAL_TABLET | Freq: Every day | ORAL | Status: DC
  Administered 2024-02-23: 1 via ORAL
  Filled 2024-02-22: qty 1

## 2024-02-22 MED ORDER — OXYCODONE HCL 5 MG PO TABS
5.0000 mg | ORAL_TABLET | ORAL | Status: DC | PRN
  Administered 2024-02-22 – 2024-02-23 (×2): 5 mg via ORAL
  Filled 2024-02-22: qty 2
  Filled 2024-02-22 (×2): qty 1

## 2024-02-22 MED ORDER — DROPERIDOL 2.5 MG/ML IJ SOLN: 0.6250 mg | Freq: Once | INTRAMUSCULAR | Status: DC | PRN

## 2024-02-22 MED ORDER — ORAL CARE MOUTH RINSE: 15.0000 mL | Freq: Once | OROMUCOSAL | Status: AC

## 2024-02-22 MED ORDER — CEFAZOLIN SODIUM-DEXTROSE 2-4 GM/100ML-% IV SOLN
2.0000 g | Freq: Four times a day (QID) | INTRAVENOUS | Status: AC
  Administered 2024-02-22 – 2024-02-23 (×2): 2 g via INTRAVENOUS
  Filled 2024-02-22 (×2): qty 100

## 2024-02-22 MED ORDER — PHENYLEPHRINE 80 MCG/ML (10ML) SYRINGE FOR IV PUSH (FOR BLOOD PRESSURE SUPPORT)
PREFILLED_SYRINGE | INTRAVENOUS | Status: DC | PRN
  Administered 2024-02-22: 80 ug via INTRAVENOUS

## 2024-02-22 MED ORDER — STERILE WATER FOR IRRIGATION IR SOLN
Status: DC | PRN
  Administered 2024-02-22: 1000 mL

## 2024-02-22 MED ORDER — SUCCINYLCHOLINE CHLORIDE 200 MG/10ML IV SOSY
PREFILLED_SYRINGE | INTRAVENOUS | Status: AC
Start: 2024-02-22 — End: 2024-02-22
  Filled 2024-02-22: qty 10

## 2024-02-22 MED ORDER — DEXMEDETOMIDINE HCL IN NACL 80 MCG/20ML IV SOLN
INTRAVENOUS | Status: AC
  Filled 2024-02-22: qty 20

## 2024-02-22 MED ORDER — FENTANYL CITRATE (PF) 250 MCG/5ML IJ SOLN
INTRAMUSCULAR | Status: DC | PRN
  Administered 2024-02-22 (×3): 50 ug via INTRAVENOUS
  Administered 2024-02-22 (×4): 25 ug via INTRAVENOUS
  Administered 2024-02-22: 50 ug via INTRAVENOUS
  Administered 2024-02-22: 25 ug via INTRAVENOUS
  Administered 2024-02-22: 50 ug via INTRAVENOUS
  Administered 2024-02-22: 25 ug via INTRAVENOUS

## 2024-02-22 MED ORDER — POVIDONE-IODINE 10 % EX SWAB
2.0000 | Freq: Once | CUTANEOUS | Status: AC
  Administered 2024-02-22: 2 via TOPICAL

## 2024-02-22 MED ORDER — LACTATED RINGERS IV SOLN: INTRAVENOUS | Status: DC

## 2024-02-22 MED ORDER — ONDANSETRON HCL 4 MG/2ML IJ SOLN
INTRAMUSCULAR | Status: DC | PRN
Start: 2024-02-22 — End: 2024-02-22
  Administered 2024-02-22: 4 mg via INTRAVENOUS

## 2024-02-22 MED ORDER — TAMSULOSIN HCL 0.4 MG PO CAPS
0.4000 mg | ORAL_CAPSULE | Freq: Every day | ORAL | Status: DC
  Administered 2024-02-23: 0.4 mg via ORAL
  Filled 2024-02-22: qty 1

## 2024-02-22 MED ORDER — POLYVINYL ALCOHOL 1.4 % OP SOLN: 1.0000 [drp] | Freq: Four times a day (QID) | OPHTHALMIC | Status: DC | PRN

## 2024-02-22 MED ORDER — SODIUM CHLORIDE 0.9 % IV SOLN: INTRAVENOUS | Status: DC | PRN

## 2024-02-22 MED ORDER — FENTANYL CITRATE (PF) 100 MCG/2ML IJ SOLN
INTRAMUSCULAR | Status: AC
  Filled 2024-02-22: qty 2

## 2024-02-22 MED ORDER — ALBUMIN HUMAN 5 % IV SOLN: INTRAVENOUS | Status: DC | PRN

## 2024-02-22 MED ORDER — AMLODIPINE BESYLATE 10 MG PO TABS
10.0000 mg | ORAL_TABLET | Freq: Every day | ORAL | Status: DC
  Administered 2024-02-23: 10 mg via ORAL
  Filled 2024-02-22: qty 1

## 2024-02-22 MED ORDER — ROCURONIUM BROMIDE 10 MG/ML (PF) SYRINGE
PREFILLED_SYRINGE | INTRAVENOUS | Status: AC
Start: 2024-02-22 — End: 2024-02-22
  Filled 2024-02-22: qty 10

## 2024-02-22 MED ORDER — PROPOFOL 10 MG/ML IV BOLUS
INTRAVENOUS | Status: DC | PRN
  Administered 2024-02-22: 30 mg via INTRAVENOUS
  Administered 2024-02-22: 120 mg via INTRAVENOUS

## 2024-02-22 MED ORDER — DEXAMETHASONE SOD PHOSPHATE PF 10 MG/ML IJ SOLN
INTRAMUSCULAR | Status: DC | PRN
  Administered 2024-02-22: 10 mg via INTRAVENOUS

## 2024-02-22 MED ORDER — OXYCODONE HCL 5 MG/5ML PO SOLN
5.0000 mg | Freq: Once | ORAL | Status: AC | PRN
  Administered 2024-02-22: 5 mg via ORAL

## 2024-02-22 MED ORDER — PROPOFOL 10 MG/ML IV BOLUS
INTRAVENOUS | Status: AC
Start: 2024-02-22 — End: 2024-02-22
  Filled 2024-02-22: qty 20

## 2024-02-22 MED ORDER — VANCOMYCIN HCL 1000 MG IV SOLR
INTRAVENOUS | Status: DC | PRN
  Administered 2024-02-22: 1000 mg via TOPICAL

## 2024-02-22 MED ORDER — INSULIN ASPART 100 UNIT/ML IJ SOLN
0.0000 [IU] | Freq: Every day | INTRAMUSCULAR | Status: DC
  Administered 2024-02-22: 2 [IU] via SUBCUTANEOUS

## 2024-02-22 MED ORDER — ACETAMINOPHEN 10 MG/ML IV SOLN: 1000.0000 mg | Freq: Once | INTRAVENOUS | Status: DC | PRN

## 2024-02-22 MED ORDER — METHOCARBAMOL 500 MG PO TABS
500.0000 mg | ORAL_TABLET | Freq: Four times a day (QID) | ORAL | Status: DC
  Administered 2024-02-22 – 2024-02-23 (×3): 500 mg via ORAL
  Filled 2024-02-22 (×3): qty 1

## 2024-02-22 MED ORDER — ONDANSETRON HCL 4 MG/2ML IJ SOLN
INTRAMUSCULAR | Status: AC
  Filled 2024-02-22: qty 2

## 2024-02-22 MED ORDER — GLIMEPIRIDE 2 MG PO TABS
1.0000 mg | ORAL_TABLET | Freq: Every day | ORAL | Status: DC
  Administered 2024-02-23: 1 mg via ORAL
  Filled 2024-02-22: qty 1

## 2024-02-22 MED ORDER — INSULIN ASPART 100 UNIT/ML IJ SOLN
0.0000 [IU] | INTRAMUSCULAR | Status: DC | PRN
  Administered 2024-02-22: 3 [IU] via SUBCUTANEOUS

## 2024-02-22 MED ORDER — FLUTICASONE PROPIONATE 50 MCG/ACT NA SUSP: 2.0000 | NASAL | Status: DC | PRN

## 2024-02-22 MED ORDER — TRANEXAMIC ACID-NACL 1000-0.7 MG/100ML-% IV SOLN
1000.0000 mg | Freq: Once | INTRAVENOUS | Status: AC
  Administered 2024-02-22: 1000 mg via INTRAVENOUS
  Filled 2024-02-22: qty 100

## 2024-02-22 MED ORDER — METFORMIN HCL 500 MG PO TABS
1000.0000 mg | ORAL_TABLET | Freq: Two times a day (BID) | ORAL | Status: DC
  Administered 2024-02-23: 1000 mg via ORAL
  Filled 2024-02-22: qty 2

## 2024-02-22 MED ORDER — CHLORHEXIDINE GLUCONATE 0.12 % MT SOLN
15.0000 mL | Freq: Once | OROMUCOSAL | Status: AC
  Administered 2024-02-22: 15 mL via OROMUCOSAL
  Filled 2024-02-22: qty 15

## 2024-02-22 MED ORDER — TRANEXAMIC ACID-NACL 1000-0.7 MG/100ML-% IV SOLN
1000.0000 mg | INTRAVENOUS | Status: AC
  Administered 2024-02-22: 1000 mg via INTRAVENOUS
  Filled 2024-02-22: qty 100

## 2024-02-22 MED ORDER — PROPOFOL 10 MG/ML IV BOLUS
INTRAVENOUS | Status: AC
  Filled 2024-02-22: qty 20

## 2024-02-22 MED ORDER — INSULIN ASPART 100 UNIT/ML IJ SOLN
0.0000 [IU] | Freq: Three times a day (TID) | INTRAMUSCULAR | Status: DC
  Administered 2024-02-22 – 2024-02-23 (×2): 3 [IU] via SUBCUTANEOUS
  Administered 2024-02-23: 2 [IU] via SUBCUTANEOUS

## 2024-02-22 MED ORDER — ONDANSETRON HCL 4 MG/2ML IJ SOLN: 4.0000 mg | Freq: Four times a day (QID) | INTRAMUSCULAR | Status: DC | PRN

## 2024-02-22 MED ORDER — FINASTERIDE 5 MG PO TABS
5.0000 mg | ORAL_TABLET | Freq: Every day | ORAL | Status: DC
  Administered 2024-02-22 – 2024-02-23 (×2): 5 mg via ORAL
  Filled 2024-02-22 (×2): qty 1

## 2024-02-22 MED ORDER — SURGIFLO WITH THROMBIN (HEMOSTATIC MATRIX KIT) OPTIME
TOPICAL | Status: DC | PRN
  Administered 2024-02-22: 1 via TOPICAL

## 2024-02-22 MED ORDER — CEFAZOLIN SODIUM-DEXTROSE 2-4 GM/100ML-% IV SOLN
2.0000 g | INTRAVENOUS | Status: AC
  Administered 2024-02-22: 2 g via INTRAVENOUS
  Filled 2024-02-22: qty 100

## 2024-02-22 MED ORDER — ONDANSETRON HCL 4 MG PO TABS: 4.0000 mg | ORAL_TABLET | Freq: Four times a day (QID) | ORAL | Status: DC | PRN

## 2024-02-22 MED ORDER — LIDOCAINE 2% (20 MG/ML) 5 ML SYRINGE
INTRAMUSCULAR | Status: DC | PRN
  Administered 2024-02-22: 100 mg via INTRAVENOUS

## 2024-02-22 MED ORDER — LOSARTAN POTASSIUM 50 MG PO TABS
100.0000 mg | ORAL_TABLET | Freq: Every day | ORAL | Status: DC
  Administered 2024-02-23: 100 mg via ORAL
  Filled 2024-02-22: qty 2

## 2024-02-22 MED ORDER — PROPOFOL 1000 MG/100ML IV EMUL
INTRAVENOUS | Status: AC
  Filled 2024-02-22: qty 100

## 2024-02-22 MED ORDER — ROCURONIUM BROMIDE 10 MG/ML (PF) SYRINGE
PREFILLED_SYRINGE | INTRAVENOUS | Status: DC | PRN
  Administered 2024-02-22 (×2): 50 mg via INTRAVENOUS
  Administered 2024-02-22: 10 mg via INTRAVENOUS
  Administered 2024-02-22 (×3): 20 mg via INTRAVENOUS
  Administered 2024-02-22: 10 mg via INTRAVENOUS

## 2024-02-22 MED ORDER — OXYCODONE HCL 5 MG/5ML PO SOLN
ORAL | Status: AC
  Filled 2024-02-22: qty 5

## 2024-02-22 MED ORDER — FENTANYL CITRATE (PF) 100 MCG/2ML IJ SOLN
25.0000 ug | INTRAMUSCULAR | Status: DC | PRN
  Administered 2024-02-22 (×2): 50 ug via INTRAVENOUS

## 2024-02-22 MED ORDER — SUGAMMADEX SODIUM 200 MG/2ML IV SOLN
INTRAVENOUS | Status: DC | PRN
  Administered 2024-02-22: 200 mg via INTRAVENOUS

## 2024-02-22 MED ORDER — DILTIAZEM HCL ER 60 MG PO CP12
120.0000 mg | ORAL_CAPSULE | Freq: Two times a day (BID) | ORAL | Status: DC
  Administered 2024-02-22 – 2024-02-23 (×2): 120 mg via ORAL
  Filled 2024-02-22 (×3): qty 2

## 2024-02-22 MED ORDER — LORATADINE 10 MG PO TABS
10.0000 mg | ORAL_TABLET | Freq: Every day | ORAL | Status: DC
  Administered 2024-02-22 – 2024-02-23 (×2): 10 mg via ORAL
  Filled 2024-02-22 (×2): qty 1

## 2024-02-22 MED ORDER — DEXMEDETOMIDINE HCL IN NACL 80 MCG/20ML IV SOLN
INTRAVENOUS | Status: DC | PRN
  Administered 2024-02-22: 4 ug via INTRAVENOUS
  Administered 2024-02-22: 8 ug via INTRAVENOUS
  Administered 2024-02-22: 4 ug via INTRAVENOUS
  Administered 2024-02-22: 8 ug via INTRAVENOUS
  Administered 2024-02-22: 4 ug via INTRAVENOUS

## 2024-02-22 MED ORDER — ACETAMINOPHEN 500 MG PO TABS
1000.0000 mg | ORAL_TABLET | Freq: Three times a day (TID) | ORAL | Status: DC
  Administered 2024-02-22 – 2024-02-23 (×3): 1000 mg via ORAL
  Filled 2024-02-22 (×3): qty 2

## 2024-02-22 MED ORDER — KETOROLAC TROMETHAMINE 15 MG/ML IJ SOLN
15.0000 mg | Freq: Three times a day (TID) | INTRAMUSCULAR | Status: DC
  Administered 2024-02-22 – 2024-02-23 (×3): 15 mg via INTRAVENOUS
  Filled 2024-02-22 (×3): qty 1

## 2024-02-22 MED ORDER — HYDROMORPHONE HCL 1 MG/ML IJ SOLN: 0.5000 mg | INTRAMUSCULAR | Status: DC | PRN

## 2024-02-22 MED ORDER — OXYCODONE HCL 5 MG PO TABS: 5.0000 mg | ORAL_TABLET | Freq: Once | ORAL | Status: AC | PRN

## 2024-02-22 MED ORDER — BUPIVACAINE-EPINEPHRINE (PF) 0.25% -1:200000 IJ SOLN
INTRAMUSCULAR | Status: DC | PRN
  Administered 2024-02-22: 20 mL

## 2024-02-22 MED ORDER — ATORVASTATIN CALCIUM 40 MG PO TABS
40.0000 mg | ORAL_TABLET | Freq: Every day | ORAL | Status: DC
  Administered 2024-02-22: 40 mg via ORAL
  Filled 2024-02-22: qty 1

## 2024-02-22 MED ORDER — DEXAMETHASONE SOD PHOSPHATE PF 10 MG/ML IJ SOLN: 10.0000 mg | Freq: Once | INTRAMUSCULAR | Status: DC

## 2024-02-22 MED ORDER — LIDOCAINE 2% (20 MG/ML) 5 ML SYRINGE
INTRAMUSCULAR | Status: AC
  Filled 2024-02-22: qty 5

## 2024-02-22 MED ORDER — VANCOMYCIN HCL 1000 MG IV SOLR
INTRAVENOUS | Status: AC
  Filled 2024-02-22: qty 20

## 2024-02-22 MED ORDER — 0.9 % SODIUM CHLORIDE (POUR BTL) OPTIME
TOPICAL | Status: DC | PRN
  Administered 2024-02-22: 1000 mL

## 2024-02-22 MED ORDER — SODIUM CHLORIDE 0.9 % IV SOLN
0.1500 ug/kg/min | INTRAVENOUS | Status: DC
  Filled 2024-02-22 (×3): qty 5000

## 2024-02-22 MED ORDER — PHENYLEPHRINE HCL-NACL 20-0.9 MG/250ML-% IV SOLN
INTRAVENOUS | Status: DC | PRN
  Administered 2024-02-22: 15 ug/min via INTRAVENOUS

## 2024-02-22 MED ORDER — BUPIVACAINE-EPINEPHRINE (PF) 0.25% -1:200000 IJ SOLN
INTRAMUSCULAR | Status: AC
Start: 2024-02-22 — End: 2024-02-22
  Filled 2024-02-22: qty 30

## 2024-02-22 MED ORDER — VITAMIN D 25 MCG (1000 UNIT) PO TABS
1000.0000 [IU] | ORAL_TABLET | Freq: Every day | ORAL | Status: DC
  Administered 2024-02-22 – 2024-02-23 (×2): 1000 [IU] via ORAL
  Filled 2024-02-22 (×2): qty 1

## 2024-02-22 MED ORDER — DOCUSATE SODIUM 100 MG PO CAPS
100.0000 mg | ORAL_CAPSULE | Freq: Two times a day (BID) | ORAL | Status: DC
  Administered 2024-02-22 – 2024-02-23 (×2): 100 mg via ORAL
  Filled 2024-02-22 (×2): qty 1

## 2024-02-22 SURGICAL SUPPLY — 68 items
ALCOHOL 70% 16 OZ (MISCELLANEOUS) IMPLANT
BENZOIN TINCTURE PRP APPL 2/3 (GAUZE/BANDAGES/DRESSINGS) IMPLANT
BLADE CLIPPER SURG (BLADE) IMPLANT
BUR MATCHSTICK NEURO 3.0 LAGG (BURR) IMPLANT
BUR NEURO DRILL SOFT 3.0X3.8M (BURR) ×1 IMPLANT
CAGE SABLE 10X26 6-12 8D (Cage) IMPLANT
CANISTER SUCTION 3000ML PPV (SUCTIONS) ×1 IMPLANT
CLSR STERI-STRIP ANTIMIC 1/2X4 (GAUZE/BANDAGES/DRESSINGS) ×1 IMPLANT
CORD BIPOLAR FORCEPS 12FT (ELECTRODE) IMPLANT
COVER SURGICAL LIGHT HANDLE (MISCELLANEOUS) ×1 IMPLANT
DERMABOND ADVANCED .7 DNX12 (GAUZE/BANDAGES/DRESSINGS) ×1 IMPLANT
DRAIN HEMOVAC 7FR (DRAIN) ×2 IMPLANT
DRAPE C-ARM 42X72 X-RAY (DRAPES) ×1 IMPLANT
DRAPE C-ARMOR (DRAPES) IMPLANT
DRAPE INCISE IOBAN 66X45 STRL (DRAPES) IMPLANT
DRAPE MICROSCOPE LEICA 54X105 (DRAPES) IMPLANT
DRAPE POUCH INSTRU U-SHP 10X18 (DRAPES) IMPLANT
DRAPE UTILITY XL STRL (DRAPES) ×2 IMPLANT
DRESSING MEPILEX FLEX 4X4 (GAUZE/BANDAGES/DRESSINGS) ×1 IMPLANT
DRSG MEPILEX POST OP 4X8 (GAUZE/BANDAGES/DRESSINGS) ×2 IMPLANT
DRSG TEGADERM 4X10 (GAUZE/BANDAGES/DRESSINGS) ×1 IMPLANT
DRSG TEGADERM 4X4.75 (GAUZE/BANDAGES/DRESSINGS) IMPLANT
DURAPREP 26ML APPLICATOR (WOUND CARE) ×1 IMPLANT
ELECT COATED BLADE 2.86 ST (ELECTRODE) ×1 IMPLANT
ELECT PENCIL ROCKER SW 15FT (MISCELLANEOUS) ×1 IMPLANT
ELECTRODE REM PT RTRN 9FT ADLT (ELECTROSURGICAL) ×1 IMPLANT
GAUZE SPONGE 4X4 12PLY STRL (GAUZE/BANDAGES/DRESSINGS) ×1 IMPLANT
GLOVE INDICATOR 7.5 STRL GRN (GLOVE) ×1 IMPLANT
GLOVE SS BIOGEL STRL SZ 7.5 (GLOVE) ×3 IMPLANT
GOWN STRL REUS W/ TWL XL LVL3 (GOWN DISPOSABLE) ×1 IMPLANT
GOWN STRL SURGICAL XL XLNG (GOWN DISPOSABLE) ×1 IMPLANT
GRAFT BNE FBR PLIAFX PRIME 10 (Bone Implant) IMPLANT
INTERBODY SABLE 10X26 7-14 15D (Miscellaneous) IMPLANT
KIT BASIN OR (CUSTOM PROCEDURE TRAY) ×1 IMPLANT
KIT POSITIONER JACKSON TABLE (MISCELLANEOUS) ×1 IMPLANT
KIT TURNOVER KIT B (KITS) ×1 IMPLANT
NDL 22X1.5 STRL (OR ONLY) (MISCELLANEOUS) ×1 IMPLANT
NDL HYPO 22X1.5 SAFETY MO (MISCELLANEOUS) IMPLANT
NEEDLE 22X1.5 STRL (OR ONLY) (MISCELLANEOUS) IMPLANT
NEEDLE HYPO 22X1.5 SAFETY MO (MISCELLANEOUS) ×1 IMPLANT
PACK LAMINECTOMY ORTHO (CUSTOM PROCEDURE TRAY) ×1 IMPLANT
PATTIES SURGICAL .5 X.5 (GAUZE/BANDAGES/DRESSINGS) IMPLANT
PUTTY DBM INSTAFILL CART 5CC (Putty) IMPLANT
ROD RELINE TI LORD 5.5X70 (Rod) IMPLANT
SCREW LOCK RELINE 5.5 TULIP (Screw) IMPLANT
SCREW RELINE-O POLY 6.5X40 (Screw) IMPLANT
SCREW RELINE-O POLY 6.5X45 (Screw) IMPLANT
SCREW RELINE-O POLY 6.5X50MM (Screw) IMPLANT
SOLN 0.9% NACL POUR BTL 1000ML (IV SOLUTION) ×1 IMPLANT
SOLN STERILE WATER BTL 1000 ML (IV SOLUTION) ×1 IMPLANT
SPONGE SURGIFOAM ABS GEL 100 (HEMOSTASIS) IMPLANT
SPONGE T-LAP 4X18 ~~LOC~~+RFID (SPONGE) ×1 IMPLANT
SUCTION TUBE FRAZIER 10FR DISP (SUCTIONS) ×1 IMPLANT
SURGIFLO W/THROMBIN 8M KIT (HEMOSTASIS) IMPLANT
SUT BONE WAX W31G (SUTURE) ×1 IMPLANT
SUT MNCRL AB 3-0 PS2 18 (SUTURE) IMPLANT
SUT VIC AB 0 CT1 18XCR BRD8 (SUTURE) ×1 IMPLANT
SUT VIC AB 2-0 CT1 18 (SUTURE) ×1 IMPLANT
SUT VIC AB 2-0 CT2 18 VCP726D (SUTURE) ×1 IMPLANT
SUT VIC AB 3-0 PS2 18XBRD (SUTURE) ×1 IMPLANT
SYR 3ML LL SCALE MARK (SYRINGE) IMPLANT
SYR BULB IRRIG 60ML STRL (SYRINGE) ×1 IMPLANT
SYR CONTROL 10ML LL (SYRINGE) ×1 IMPLANT
TIP CONICAL INSTAFILL (ORTHOPEDIC DISPOSABLE SUPPLIES) IMPLANT
TOWEL GREEN STERILE (TOWEL DISPOSABLE) ×1 IMPLANT
TOWEL GREEN STERILE FF (TOWEL DISPOSABLE) ×1 IMPLANT
TRAY FOLEY W/BAG SLVR 16FR ST (SET/KITS/TRAYS/PACK) IMPLANT
TUBING FEATHERFLOW (TUBING) ×1 IMPLANT

## 2024-02-22 NOTE — Anesthesia Postprocedure Evaluation (Signed)
 Anesthesia Post Note  Patient: JANARD CULP  Procedure(s) Performed: TRANSFORAMINAL LUMBAR INTERBODY FUSION (TLIF) WITH PEDICLE SCREW FIXATION 2 LEVEL (Spine Lumbar)     Patient location during evaluation: PACU Anesthesia Type: General Level of consciousness: awake and alert Pain management: pain level controlled Vital Signs Assessment: post-procedure vital signs reviewed and stable Respiratory status: spontaneous breathing, nonlabored ventilation, respiratory function stable and patient connected to nasal cannula oxygen Cardiovascular status: blood pressure returned to baseline and stable Postop Assessment: no apparent nausea or vomiting Anesthetic complications: no   No notable events documented.  Last Vitals:  Vitals:   02/22/24 1415 02/22/24 1430  BP: 123/63 (!) 119/54  Pulse: (!) 121 (!) 122  Resp: 20 15  Temp:  36.7 C  SpO2: 93% 92%    Last Pain:  Vitals:   02/22/24 1430  TempSrc:   PainSc: 2                  Rome Ade

## 2024-02-22 NOTE — Brief Op Note (Signed)
 02/22/2024  2:04 PM  PATIENT:  Joshua Ali  83 y.o. male  PRE-OPERATIVE DIAGNOSIS:  LUMBAR RADICULOPATHY  POST-OPERATIVE DIAGNOSIS:  LUMBAR RADICULOPATHY  PROCEDURE:  Procedure(s) with comments: TRANSFORAMINAL LUMBAR INTERBODY FUSION (TLIF) WITH PEDICLE SCREW FIXATION 2 LEVEL (N/A) - L4-5, L5-S1 TRANSFORAMINAL LUMBAR INTERBODY FUSION AND POSTERIOR INSTRUMENTED SPINAL FUSION  SURGEON:  Surgeons and Role:    * Georgina Ozell LABOR, MD - Primary  PHYSICIAN ASSISTANT: none  ASSISTANTS: none   ANESTHESIA:   general  EBL:  400 mL   BLOOD ADMINISTERED:none  DRAINS: none   LOCAL MEDICATIONS USED:  MARCAINE      SPECIMEN:  No Specimen  DISPOSITION OF SPECIMEN:  N/A  COUNTS:  YES  TOURNIQUET:  NONE  DICTATION: .Note written in EPIC  PLAN OF CARE: Admit for overnight observation  PATIENT DISPOSITION:  PACU - hemodynamically stable.   Delay start of Pharmacological VTE agent (>24hrs) due to surgical blood loss or risk of bleeding: yes

## 2024-02-22 NOTE — Transfer of Care (Signed)
 Immediate Anesthesia Transfer of Care Note  Patient: Joshua Ali  Procedure(s) Performed: TRANSFORAMINAL LUMBAR INTERBODY FUSION (TLIF) WITH PEDICLE SCREW FIXATION 2 LEVEL (Spine Lumbar)  Patient Location: PACU  Anesthesia Type:General  Level of Consciousness: awake, alert , oriented, drowsy, and patient cooperative  Airway & Oxygen Therapy: Patient Spontanous Breathing  Post-op Assessment: Report given to RN and Post -op Vital signs reviewed and stable  Post vital signs: Reviewed and stable  Last Vitals:  Vitals Value Taken Time  BP 115/48 02/22/24 14:01  Temp    Pulse 123 02/22/24 14:05  Resp 16 02/22/24 14:05  SpO2 93 % 02/22/24 14:05  Vitals shown include unfiled device data.  Last Pain:  Vitals:   02/22/24 0615  TempSrc:   PainSc: 4          Complications: No notable events documented.

## 2024-02-22 NOTE — Anesthesia Procedure Notes (Signed)
 Procedure Name: Intubation Date/Time: 02/22/2024 7:47 AM  Performed by: Cindie Donald CROME, CRNAPre-anesthesia Checklist: Patient identified, Emergency Drugs available, Suction available and Patient being monitored Patient Re-evaluated:Patient Re-evaluated prior to induction Oxygen Delivery Method: Circle System Utilized Preoxygenation: Pre-oxygenation with 100% oxygen Induction Type: IV induction Ventilation: Mask ventilation without difficulty and Oral airway inserted - appropriate to patient size Laryngoscope Size: Mac and 4 Grade View: Grade II Tube type: Oral Tube size: 7.0 mm Number of attempts: 1 Airway Equipment and Method: Stylet and Oral airway Placement Confirmation: ETT inserted through vocal cords under direct vision, positive ETCO2 and breath sounds checked- equal and bilateral Secured at: 23 cm Tube secured with: Tape Dental Injury: Teeth and Oropharynx as per pre-operative assessment

## 2024-02-22 NOTE — Plan of Care (Signed)

## 2024-02-22 NOTE — Progress Notes (Signed)
 Orthopedic Surgery Post-operative Progress Note  Assessment: Patient is a 83 y.o. male who is currently admitted after undergoing L4-S1 TLIF and PSIF   Plan: -Operative plans complete -Needs upright films when able -Drain: none -Out of bed as tolerated, no brace -No bending/lifting/twisting greater than 10 pounds -OT evaluate and treat -Pain control -Diabetic diet -No chemoprophylaxis for dvt or antiplatelets for 72 hours after surgery -Ancef  x2 post-operative doses -Disposition: to floor from PACU  ___________________________________________________________________________   Subjective: No acute events since surgery. Pain well controlled.   Objective:  General: no acute distress, appropriate affect Neurologic: alert, answering questions appropriately, following commands Respiratory: unlabored breathing on room air Skin: dressing clear/dry/intact  MSK (spine):  -Strength exam      Right  Left  EHL    4/5  5/5 TA    5/5  5/5 GSC    5/5  5/5 Knee extension  5/5  5/5 Hip flexion   5/5  5/5  -Sensory exam    Sensation intact to light touch in L3-S1 nerve distributions of bilateral lower extremities   Yesterday's total administered Morphine  Milligram Equivalents: 0   Patient name: Joshua Ali Patient MRN: [id number] Date: 02/22/24

## 2024-02-23 ENCOUNTER — Encounter (HOSPITAL_COMMUNITY): Payer: Self-pay | Admitting: Orthopedic Surgery

## 2024-02-23 ENCOUNTER — Observation Stay (HOSPITAL_COMMUNITY)

## 2024-02-23 ENCOUNTER — Other Ambulatory Visit (HOSPITAL_COMMUNITY): Payer: Self-pay

## 2024-02-23 DIAGNOSIS — M5416 Radiculopathy, lumbar region: Secondary | ICD-10-CM | POA: Diagnosis not present

## 2024-02-23 LAB — GLUCOSE, CAPILLARY
Glucose-Capillary: 161 mg/dL — ABNORMAL HIGH (ref 70–99)
Glucose-Capillary: 141 mg/dL — ABNORMAL HIGH (ref 70–99)

## 2024-02-23 LAB — CBC
HCT: 31.9 % — ABNORMAL LOW (ref 39.0–52.0)
Hemoglobin: 11 g/dL — ABNORMAL LOW (ref 13.0–17.0)
MCH: 29.6 pg (ref 26.0–34.0)
MCHC: 34.5 g/dL (ref 30.0–36.0)
MCV: 85.8 fL (ref 80.0–100.0)
Platelets: 168 K/uL (ref 150–400)
RBC: 3.72 MIL/uL — ABNORMAL LOW (ref 4.22–5.81)
RDW: 13.7 % (ref 11.5–15.5)
WBC: 14.9 K/uL — ABNORMAL HIGH (ref 4.0–10.5)
nRBC: 0 % (ref 0.0–0.2)

## 2024-02-23 LAB — BASIC METABOLIC PANEL WITH GFR
Anion gap: 17 — ABNORMAL HIGH (ref 5–15)
BUN: 21 mg/dL (ref 8–23)
CO2: 21 mmol/L — ABNORMAL LOW (ref 22–32)
Calcium: 8.9 mg/dL (ref 8.9–10.3)
Chloride: 97 mmol/L — ABNORMAL LOW (ref 98–111)
Creatinine, Ser: 1.13 mg/dL (ref 0.61–1.24)
GFR, Estimated: >60 mL/min (ref >60)
Glucose, Bld: 247 mg/dL — ABNORMAL HIGH (ref 70–99)
Potassium: 4.5 mmol/L (ref 3.5–5.1)
Sodium: 135 mmol/L (ref 135–145)

## 2024-02-23 MED ORDER — ACETAMINOPHEN 500 MG PO TABS
1000.0000 mg | ORAL_TABLET | Freq: Three times a day (TID) | ORAL | 0 refills | Status: AC
  Filled 2024-02-23: qty 84, 14d supply, fill #0

## 2024-02-23 MED ORDER — POLYETHYLENE GLYCOL 3350 17 GM/SCOOP PO POWD
17.0000 g | Freq: Every day | ORAL | 0 refills | Status: AC
  Filled 2024-02-23: qty 238, 14d supply, fill #0

## 2024-02-23 MED ORDER — METHOCARBAMOL 500 MG PO TABS
500.0000 mg | ORAL_TABLET | Freq: Four times a day (QID) | ORAL | 0 refills | Status: AC
  Filled 2024-02-23: qty 40, 10d supply, fill #0

## 2024-02-23 MED ORDER — OXYCODONE HCL 5 MG PO TABS
5.0000 mg | ORAL_TABLET | ORAL | 0 refills | Status: AC | PRN
  Filled 2024-02-23: qty 40, 4d supply, fill #0

## 2024-02-23 MED ORDER — SENNA 8.6 MG PO TABS
1.0000 | ORAL_TABLET | Freq: Two times a day (BID) | ORAL | 0 refills | Status: AC
  Filled 2024-02-23: qty 28, 14d supply, fill #0

## 2024-02-23 NOTE — Discharge Instructions (Addendum)
 Orthopedic Surgery Discharge Instructions  Patient name: Joshua Ali Procedure Performed: L4-S1 TLIF and PSIF Date of Surgery: 02/22/2024 Surgeon: Ozell Ada, MD  Pre-operative Diagnosis: lumbar radiculopathy Post-operative Diagnosis: same as above  Discharged to: home Discharge Condition: stable  Activity: You should refrain from bending, lifting, or twisting with objects greater than ten pounds until three months after surgery. You are encouraged to walk as much as desired. You can perform household activities such as cleaning dishes, doing laundry, vacuuming, etc. as long as the ten-pound restriction is followed. You do not need to wear a brace during the post-operative period.   Incision Care: Your incision has a dressing over it. That dressing should remain in place and dry at all times for a total of one week after surgery. After one week, you can remove the dressing. Underneath the dressing, you will find skin glue. You should leave the skin glue in place. It will fall off with time. Do not pick, rub, or scrub at it. Do not put cream or lotion over the surgical area. After one week and once the dressing is off, it is okay to let soap and water run over your incision. Again, do not pick, scrub, or rub at the skin glue when bathing. Do not submerge (e.g., take a bath, swim, go in a hot tub, etc.) until six weeks after surgery. There may be some bloody drainage from the incision into the dressing after surgery. This is normal. You do not need to replace the dressing. Continue to leave it in place for the one week as instructed above. Should the dressing become saturated with blood or drainage, please call the office for further instructions.   Medications: You have been prescribed oxycodone . This is a narcotic pain medication and should only be taken as prescribed. You should not drink alcohol or operate heavy machinery (including driving) while taking this medication. The oxycodone  can  cause constipation as a side effect. For that reason, you have been prescribed senna and miralax . These are both laxatives. You do not need to take this medication if you develop diarrhea. Should you remain constipated even while taking these medications, please increase the dose of miralax  to twice daily. Tylenol  has been prescribed to be taken every 8 hours, which will give you additional pain relief. Robaxin is a muscle relaxer that has been prescribed to you for muscle spasm type pain. Take this medication as needed.   Do not take NSAIDs (ibuprofen, Aleve, Celebrex , naproxen, meloxicam, etc.) for the first 6 weeks after surgery as there is some evidence that their use may decrease the chances of successful fusion.   In order to set expectations for opioid prescriptions, you will only be prescribed opioids for a total of six weeks after surgery and, at two-weeks after surgery, your opioid prescription will start to tapered (decreased dosage and number of pills). If you have ongoing need for opioid medication six weeks after surgery, you will be referred to pain management. If you are already established with a provider that is giving you opioid medications, you should schedule an appointment with them for six weeks after surgery if you feel you are going to need another prescription. State law only allows for opioid prescriptions one week at a time. If you are running out of opioid medication near the end of the week, please call the office during business hours before running out so I can send you another prescription.   You may resume any home blood thinners (  warfarin, aspirin, lovenox, apixaban, plavix, xarelto, etc) 72 hours after your surgery. Take these medications as they were previously prescribed.  Driving: You should not drive while taking narcotic pain medications. You should start getting back to driving slowly and you may want to try driving in a parking lot before doing anything more.    Diet: You are safe to resume your regular diet after surgery.   Reasons to Call the Office After Surgery: You should feel free to call the office with any concerns or questions you have in the post-operative period, but you should definitely notify the office if you develop: -shortness of breath, chest pain, or trouble breathing -excessive bleeding, drainage, redness, or swelling around the surgical site -fevers, chills, or pain that is getting worse with each passing day -persistent nausea or vomiting -new weakness in either leg -new or worsening numbness or tingling in either leg -numbness in the groin, bowel or bladder incontinence -other concerns about your surgery  Follow Up Appointments: You have a follow up visit with Dr. Georgina on 03/06/2024 at 10:15am. Please arrive on time to this appointment.   Office Information:  -Ozell Georgina, MD -Phone number: [phone] -Address: [address]       [place], [place] [address]

## 2024-02-23 NOTE — Op Note (Signed)
 Orthopedic Spine Surgery Operative Report  Procedure: L4/5 and L5/S1 combined fusion with TLIF and PSIF L4, L5, S1 pedicle screw instrumentation with rods Insertion of biomechanical interbody devices at L4/5 and L5/S1 Application of locally harvested autograft (spinous processes, removed facet joints) Application of demineralized bone matrix  Modifier: none  Date of procedure: 02/23/2024  Patient name: Joshua Ali MRN: [id number] DOB: 12/22/1940  Surgeon: Ozell Ada, MD Assistant: none Pre-operative diagnosis: lumbar radiculopathy, lumbar foraminal stenosis Post-operative diagnosis: same as above Findings: L4/5 and L5/S1 hypertrophic facets and thickened ligamentum flavum, 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 Item Serial No. Manufacturer Lot No. LRB No. Used Action  PUTTY DBM INSTAFILL CART 5CC - [id number] Putty PUTTY DBM INSTAFILL CART 5CC [id number] [employer] [id number] N/A 1 Implanted  INTERBODY SABLE 10X26 7-14 15D - [id number] Miscellaneous INTERBODY SABLE 10X26 7-14 15D  [employer] [id number] N/A 1 Implanted  PUTTY DBM INSTAFILL CART 5CC - [id number] Putty PUTTY DBM INSTAFILL CART 5CC [id number] [employer] [id number] N/A 1 Implanted  GRAFT BNE FBR PLIAFX PRIME 10 - [id number] Bone Implant GRAFT BNE FBR PLIAFX PRIME 10 [id number] [employer]  N/A 1 Implanted  CAGE SABLE 10X26 6-12 8D - [id number] Cage CAGE SABLE 10X26 6-12 8D  [employer] [id number] N/A 1 Implanted  SCREW RELINE-O POLY 6.5X40 - [id number] Screw SCREW RELINE-O POLY 6.5X40  [employer]  N/A 2 Implanted  SCREW RELINE-O POLY 6.5X45 - [id number] Screw SCREW RELINE-O POLY 6.5X45  [employer]  N/A 2 Implanted  SCREW RELINE-O POLY 6.5X50MM - [id number] Screw SCREW RELINE-O POLY 6.5X50MM  [employer]  N/A 2  Implanted  SCREW LOCK RELINE 5.5 [place] - [id number] Screw SCREW LOCK RELINE 5.5 TULIP  [employer]  N/A 6 Implanted  ROD RELINE TI LORD 5.5X70 - [id number] Rod ROD RELINE TI LORD 5.5X70  [employer]  N/A 2 Implanted      Indication for procedure: Patient is a 83 y.o. male who presented to the office with symptoms consistent with lumbar radiculopathy. The patient had tried conservative treatments that did not provide any lasting relief. As result, operative management was discussed as an option. A L4-S1 TLIF and PSIF was discussed as a treatment option. The risks including but not limited to dural tear, nerve root injury, paralysis, persistent pain, pseudarthrosis, infection, bleeding, hardware failure, adjacent segment disease, heart attack, death, stroke, fracture, blindness, and need for additional procedures were discussed with the patient. The benefit of the surgery would be improvement in the patient's radiating leg pain. The alternatives to surgical management were covered with the patient and included continued monitoring, physical therapy, over-the-counter pain medications, ambulatory aids, injections, and activity modification. All the patient's questions were answered to his satisfaction. After this discussion, the patient expressed understanding and elected to proceed with surgical intervention.    Procedure Description: The patient was met in the pre-operative holding area. The patient's identity and consent were verified. The operative site was marked. The patient's remaining questions about the surgery were answered. The patient was brought back to the operating room. General anesthesia was induced and an endotracheal tube was placed by the anesthesia staff. The patient was transferred to the prone Mulford table in the prone position. All bony prominences were well padded. The head of the bed was slightly elevated and the eyes were free from compression by the face  pillow. The surgical  area was cleansed with alcohol. Fluoroscopy was then brought in to check rotation on the AP image and to mark the levels on the lateral image. The patient's skin was then prepped and draped in a standard, sterile fashion. A time out was performed that identified the patient, the procedure, and the operative levels. All team members agreed with what was stated in the time out.   A midline incision over the spinous processes of the previously marked levels was made and sharp dissection was continued down through the skin and dermis. Electrocautery was then used to continue the midline dissection down to the level of the spinous process. Subperiosteal dissection was performed using electrocautery to expose the lamina out lateral to the facet joint capsule. Care was taken to not violate the facet joint capsules. A lateral fluoroscopic image was taken to confirm the level. Subperiosteal dissection with electrocautery was then done to expose all the lamina and pars interarticularis of L4, L5, and S1. The facet capsules at L4/5 and L5/S1 were removed with electrocautery. The L3/4 facet joint was preserved. Electrocautery was then used to dissect lateral the facets to find the transverse process at each level. Electrocautery was used to clear the soft tissue off the transverse processes.   Next, the pedicle screws were placed. The starting points for the pedicle screws were identified using anatomical landmarks. Fluoroscopy was brought in in the AP view to confirm the start points. Each pedicle screw was placed under AP fluoroscopy. A true AP was obtained of the vertebra where screw was being placed. Then, a pilot hole was made with a 3mm matchstick burr at the start point. A pedicle finder was advanced into the pilot hole. AP fluoroscopy was used to confirm proper starting point. A image was taken when the pedicle finder had been advanced 10mm, 15mm, and at 20mm. The pedicle finder remained within the pedicle and did  not breach medially at any of those markers. The pedicle finder was then advanced to 35mm and then withdrawn. A feeler was placed into the hole and confirmed no pedicle wall breaches. It was advanced to the ventral cortex by palpation. The screw length was then estimated off of the depth of the feeler. The widths of the screws had been predetermined as they were measured pre-operatively on the patient's advanced imaging. That measured length and pre-determined width screw was then inserted under AP fluoroscopic guidance. The screw was advanced approximately 10mm, 15mm, and then 20mm. At each one of those intervals an image was taken to confirm that the screw was following the trajectory of the pedicle and had not breached medially. The screw was then advanced fully until there was good purchase. This process was repeated for every pedicle screw.     The screws that were inserted were [employer] Reline:               Left                  Right L4 6.5x50  6.5x50 L5 6.5x45  6.5x40 S1 6.5x40  6.5x40   AP and lateral fluoroscopic images were then taken to visualize all the screws. The screws were in satisfactory position.   A rongeur was used to remove the spinous processes and interspinous ligaments between the L3/4 interspinous ligament to the cranial portion of the S1 spinous process. The spinous processes, but not the interspinous ligaments or soft tissue, was saved and morselized on the back table. Bone wax was  used to obtain hemostasis at the bleeding bony surfaces.   A high-speed burr was used to thin the lamina at L5 on the right side and then was taken laterally to exit at the pars interarticularis on the right. The burr was used to thin the lamina to the level of the ligamentum caudally at the inferior aspect of of the L5 lamina. More cranially and at the pars the lamina was thinned to the approximate level of the ligamentum flavum. A 3 kerrison rongeur was then used to remove the thinned lamina and  the medial aspect of the pars intraarticularis. An osteotome was then used to break the pars intraarticularis. A combination of a curette and a pituitary was used to slowly remove the inferior facet of L5. A 3 kerrison was tehn used to remove the ligamentum and joint capsule overlying the thecal sac. A woodsen was then used to palpate the pedicle wall of S1. The woodsen was then placed just cranial to the pedicle and underneath the S1 superior facet. An osteotome was then used to cut the superior facet of S1 where the woodsen was just above the S1 pedicle. A pituitary and curette was then used to remove the superior facet of S1. The thecal sac was then mobilized medially with a penfield. Floseal was used to obtain hemostasis. A nerve root retractor was then placed around the thecal sac. A 15 blade knife was used to make an annulotomy and cut a rectangular piece of annulus and disc. A pituitary was then used to remove this piece of disc. A dilator was then used to open up the disc space under lateral fluoroscopic guidance. This was done until a 9 dilator was placed into the disc space. A ring curette, angled curette, and straight curette were then used to remove the disc and cartilaginous endplates at L5 and S1. Periodic lateral fluoroscopic images were obtained. A pituitary was used to remove the cartilaginous material and disc material. Once the disc and cartilaginous endplates had been removed, a funnel was used to pack autograft in the ventral disc space. A 7x26x10 biomechanical interbody device was selected. It was placed into the L5/S1 disc space under lateral fluoroscopic guidance. The interbody device was expanded under lateral fluoroscopic guidance. The interbody device was then back filled with demineralized bone matrix. A woodsen was placed into the right L5/S1 foramen and no stenosis was felt. The woodsen was able to pass freely beneath the L5 pedicle.   A high-speed burr was used to thin the lamina at  L4 on the right side and then was taken laterally to exit at the pars interarticularis on the right. The burr was used to thin the lamina to the level of the ligamentum caudally at the inferior aspect of of the L4 lamina. More cranially and at the pars the lamina was thinned to the approximate level of the ligamentum flavum. A 3 kerrison rongeur was then used to remove the thinned lamina and the medial aspect of the pars intraarticularis. An osteotome was then used to break the pars intraarticularis. A combination of a curette and a pituitary was used to slowly remove the inferior facet of L4. A 3 kerrison was then used to remove the ligamentum and joint capsule overlying the thecal sac. A woodsen was then used to palpate the pedicle wall of L5. The woodsen was then placed just cranial to the pedicle and underneath the L5 superior facet. An osteotome was then used to cut the superior facet of L5  where the woodsen was just above the L5 pedicle. A pituitary and curette was then used to remove the superior facet of L5. The thecal sac was then mobilized medially with a penfield. Floseal was used to obtain hemostasis. A nerve root retractor was then placed around the thecal sac. A 15 blade knife was used to make an annulotomy and cut a rectangular piece of annulus and disc. A pituitary was then used to remove this piece of disc. A dilator was then used to open up the disc space under lateral fluoroscopic guidance. This was done until a 8 dilator was placed into the disc space. A ring curette, angled curette, and straight curette were then used to remove the disc and cartilaginous endplates at L4 and L5. Periodic lateral fluoroscopic images were obtained. A pituitary was used to remove the cartilaginous material and disc material. Once the disc and cartilaginous endplates had been removed, a funnel was used to pack autograft in the ventral disc space. A 6x26x10 biomechanical interbody device was selected. It was placed  into the L4/L5 disc space under lateral fluoroscopic guidance. The interbody device was expanded under lateral fluoroscopic guidance. The interbody device was then back filled with demineralized bone matrix. A woodsen was placed into the right L4/5 foramen and no stenosis was felt. The woodsen was able to pass freely beneath the L4 pedicle.    A 5.5x71mm titanium rod was selected and placed into the pedicle screws at L4, L5, and S1. Set caps were then tightened over the pedicle screws. A final tightener was used to final tighten the set caps. The same process was then repeated for the contralateral side. A high speed burr was then used to decorticate the L4/5 and L5/S1 facet joints on the right. It was also used to decorticate the L4, L5, and S1 lamina on the right. The L4, L5, S1 transverse processes were then decorticated with the burr bilaterally. The locally harvested and morselized autograft and demineralized bone matrix were placed over the decorticated bone. All bony fragments that inadvertently went into the facetectomy defects were removed. The decompressed area was again palpated with a woodsen which confirmed satisfactory decompression.   1g of vancomycin powder was placed into the wound. The fascia was reapproximated with 0 vicryl suture. The subcutaneous fat was reapproximated with 0 vicryl suture. The deep dermal layer was reapproximated with 2-0 vicryl. The skin as closed with a 3-0 running moncryl. All counts were correct at the end of the case. Dermabond was applied over the skin. An island dressing was placed over the wound. The patient was transferred back to a bed and brought to the post-anesthesia care unit by anesthesia staff in stable condition.  Post-operative plan: The patient will recover in the post-anesthesia care unit and then go to the floor. The patient will receive two post-operative doses of ancef . He will get another dose of TXA. The patient will be out of bed as tolerated  with no brace. The patient will work with physical therapy. The patient will likely discharge to home in the next day or two.    Ozell Ada, MD Orthopedic Surgeon

## 2024-02-23 NOTE — Progress Notes (Signed)
 Patient alert and oriented, voided, ambulate. Surgical site clean and dry no sign of infection. D/c instructions explain and given to the patient and family all questions answered.

## 2024-02-23 NOTE — Evaluation (Signed)
 Occupational Therapy Evaluation Patient Details Name: Joshua Ali MRN: [id number] DOB: January 13, 1941 Today's Date: 02/23/2024   History of Present Illness   Pt is an 83 y/o M admitted 02/22/24 for lumbar radiculopathy and is now s/p L4/5 and L5/S1 TLIF and PLIF with orthopedics on 10/21. PMHx includes: OA, DM, HTN, HLD, colon CA s/p colostomy, R TKA.     Clinical Impressions Pt greeted in supine, eager and motivated to work with OT. PTA, pt was independent with ADLs, IADLs, driving, and mobility via RW. He presents today with minimal pain and grossly intact strength x4 extremities. He required no more than supervision for bed mobility, transfers, and functional ambulation with RW. Completed seated LB dressing with min A and toileting/grooming with supervision. Provided handout for spinal precautions with all questions answered.  Pt is functioning at a level that is adequate for d/c from an OT standpoint. No further acute nor post-acute OT needs at this time.      If plan is discharge home, recommend the following:   Assistance with cooking/housework;Assist for transportation     Functional Status Assessment         Equipment Recommendations   None recommended by OT     Recommendations for Other Services         Precautions/Restrictions   Precautions Precautions: Back Precaution Booklet Issued: Yes (comment) Recall of Precautions/Restrictions: Intact Precaution/Restrictions Comments: no brace needed Restrictions Weight Bearing Restrictions Per Provider Order: No     Mobility Bed Mobility Overal bed mobility: Needs Assistance Bed Mobility: Rolling, Sidelying to Sit Rolling: Supervision Sidelying to sit: Supervision       General bed mobility comments: cues for log roll technique, inc time/effort to come into sidelying position    Transfers Overall transfer level: Modified independent Equipment used: Rolling walker (2 wheels)                General transfer comment: Demonstrated good hand placement during transfers.      Balance Overall balance assessment: Mild deficits observed, not formally tested                                         ADL either performed or assessed with clinical judgement   ADL Overall ADL's : Needs assistance/impaired Eating/Feeding: Independent   Grooming: Supervision/safety;Wash/dry hands;Wash/dry face           Upper Body Dressing : Independent   Lower Body Dressing: Minimal assistance;Cueing for sequencing;Cueing for back precautions;Sitting/lateral leans Lower Body Dressing Details (indicate cue type and reason): threading LEs through underwear     Toileting- Clothing Manipulation and Hygiene: Supervision/safety Toileting - Clothing Manipulation Details (indicate cue type and reason): stood to urinate; pt verbalized indep with colostomy mgmt     Functional mobility during ADLs: Supervision/safety;Rolling walker (2 wheels)       Vision         Perception         Praxis         Pertinent Vitals/Pain Pain Assessment Pain Assessment: No/denies pain     Extremity/Trunk Assessment Upper Extremity Assessment Upper Extremity Assessment: Overall WFL for tasks assessed   Lower Extremity Assessment Lower Extremity Assessment: Overall WFL for tasks assessed   Cervical / Trunk Assessment Cervical / Trunk Assessment: Back Surgery   Communication Communication Communication: No apparent difficulties   Cognition Arousal: Alert Behavior During Therapy: WFL for tasks assessed/performed Cognition:  No apparent impairments             OT - Cognition Comments: intermittent reminder cues to maintain back precautions throughout                 Following commands: Intact       Cueing  General Comments   Cueing Techniques: Verbal cues;Visual cues  post-op dressing intact; supportive daughter present towards end of session   Exercises      Shoulder Instructions      Home Living Family/patient expects to be discharged to:: Private residence Living Arrangements: Spouse/significant other Available Help at Discharge: Family;Available PRN/intermittently (wife Joshua Ali) and daughter - PRN assist) Type of Home: House Home Access: Level entry     Home Layout: One level     Bathroom Shower/Tub: Estate manager/land agent Accessibility: Yes How Accessible: Accessible via walker Home Equipment: Rolling Walker (2 wheels);Cane - single point;Shower seat;Grab bars - tub/shower          Prior Functioning/Environment Prior Level of Function : Independent/Modified Independent;Driving             Mobility Comments: RW for all mobility PTA ADLs Comments: indep    OT Problem List: Decreased activity tolerance;Pain   OT Treatment/Interventions:        OT Goals(Current goals can be found in the care plan section)   Acute Rehab OT Goals Patient Stated Goal: go home   OT Frequency:       Co-evaluation              AM-PAC OT 6 Clicks Daily Activity     Outcome Measure Help from another person eating meals?: None Help from another person taking care of personal grooming?: None Help from another person toileting, which includes using toliet, bedpan, or urinal?: None Help from another person bathing (including washing, rinsing, drying)?: A Little Help from another person to put on and taking off regular upper body clothing?: None Help from another person to put on and taking off regular lower body clothing?: A Little 6 Click Score: 22   End of Session Equipment Utilized During Treatment: Rolling walker (2 wheels) Nurse Communication: Mobility status  Activity Tolerance: Patient tolerated treatment well Patient left: in chair;with call bell/phone within reach;with family/visitor present  OT Visit Diagnosis: Unsteadiness on feet (R26.81);Pain Pain - part of body:  (back)                Time: 9258-9193 OT  Time Calculation (min): 25 min Charges:  OT General Charges $OT Visit: 1 Visit OT Evaluation $OT Eval Low Complexity: 1 Low OT Treatments $Self Care/Home Management : 8-22 mins  Kristalyn Bergstresser D., MSOT, OTR/L Acute Rehabilitation Services [phone] Secure Chat Preferred  Joshua Ali 02/23/2024, 9:01 AM

## 2024-02-23 NOTE — Discharge Summary (Signed)
 Orthopedic Surgery Discharge Summary  Patient name: Joshua Ali Patient MRN: [id number] Admit today: 02/22/2024 Discharge date: 02/23/24  Attending physician: Ozell Ada, MD Final diagnosis: lumbar radiculopathy Findings: L4/5 and L5/S1 hypertrophic facets and thickened ligamentum flavum, degenerative discs at L4/5 and L5/S1   Hospital course: Patient is a 83 y.o. male who was admitted after undergoing L4-S1 transforaminal lumbar interbody fusion and posterior instrumented spinal fusion. The patient had significant pain immediately after surgery, but pain eventually was controlled with a multimodal regimen including oxycodone . Labs during the hospitalization revealed acute anemia from surgical blood loss with a hemoglobin inf 11. Patient was asymptomatic and no further blood loss was expected so no intervention was performed. The patient worked with physical therapy who recommended discharge to home. The patient was tolerating an oral diet without issue and was voiding spontaneously after surgery. The patient's vitals were stable on the day of discharge. The patient was medically ready for discharge and was discharge to home on post-operative day one.  Instructions:   Orthopedic Surgery Discharge Instructions  Patient name: Joshua Ali Procedure Performed: L4-S1 TLIF and PSIF Date of Surgery: 02/22/2024 Surgeon: Ozell Ada, MD  Pre-operative Diagnosis: lumbar radiculopathy Post-operative Diagnosis: same as above  Discharged to: home Discharge Condition: stable  Activity: You should refrain from bending, lifting, or twisting with objects greater than ten pounds until three months after surgery. You are encouraged to walk as much as desired. You can perform household activities such as cleaning dishes, doing laundry, vacuuming, etc. as long as the ten-pound restriction is followed. You do not need to wear a brace during the post-operative period.   Incision Care: Your incision  has a dressing over it. That dressing should remain in place and dry at all times for a total of one week after surgery. After one week, you can remove the dressing. Underneath the dressing, you will find skin glue. You should leave the skin glue in place. It will fall off with time. Do not pick, rub, or scrub at it. Do not put cream or lotion over the surgical area. After one week and once the dressing is off, it is okay to let soap and water run over your incision. Again, do not pick, scrub, or rub at the skin glue when bathing. Do not submerge (e.g., take a bath, swim, go in a hot tub, etc.) until six weeks after surgery. There may be some bloody drainage from the incision into the dressing after surgery. This is normal. You do not need to replace the dressing. Continue to leave it in place for the one week as instructed above. Should the dressing become saturated with blood or drainage, please call the office for further instructions.   Medications: You have been prescribed oxycodone . This is a narcotic pain medication and should only be taken as prescribed. You should not drink alcohol or operate heavy machinery (including driving) while taking this medication. The oxycodone  can cause constipation as a side effect. For that reason, you have been prescribed senna and miralax . These are both laxatives. You do not need to take this medication if you develop diarrhea. Should you remain constipated even while taking these medications, please increase the dose of miralax  to twice daily. Tylenol  has been prescribed to be taken every 8 hours, which will give you additional pain relief. Robaxin is a muscle relaxer that has been prescribed to you for muscle spasm type pain. Take this medication as needed.   Do not take NSAIDs (ibuprofen,  Aleve, Celebrex , naproxen, meloxicam, etc.) for the first 6 weeks after surgery as there is some evidence that their use may decrease the chances of successful fusion.   In order  to set expectations for opioid prescriptions, you will only be prescribed opioids for a total of six weeks after surgery and, at two-weeks after surgery, your opioid prescription will start to tapered (decreased dosage and number of pills). If you have ongoing need for opioid medication six weeks after surgery, you will be referred to pain management. If you are already established with a provider that is giving you opioid medications, you should schedule an appointment with them for six weeks after surgery if you feel you are going to need another prescription. State law only allows for opioid prescriptions one week at a time. If you are running out of opioid medication near the end of the week, please call the office during business hours before running out so I can send you another prescription.   You may resume any home blood thinners (warfarin, aspirin, lovenox, apixaban, plavix, xarelto, etc) 72 hours after your surgery. Take these medications as they were previously prescribed.  Driving: You should not drive while taking narcotic pain medications. You should start getting back to driving slowly and you may want to try driving in a parking lot before doing anything more.   Diet: You are safe to resume your regular diet after surgery.   Reasons to Call the Office After Surgery: You should feel free to call the office with any concerns or questions you have in the post-operative period, but you should definitely notify the office if you develop: -shortness of breath, chest pain, or trouble breathing -excessive bleeding, drainage, redness, or swelling around the surgical site -fevers, chills, or pain that is getting worse with each passing day -persistent nausea or vomiting -new weakness in either leg -new or worsening numbness or tingling in either leg -numbness in the groin, bowel or bladder incontinence -other concerns about your surgery  Follow Up Appointments: You have a follow up visit  with Dr. Georgina on 03/06/2024 at 10:15am. Please arrive on time to this appointment.   Office Information:  -Ozell Georgina, MD -Phone number: [phone] -Address: [address]  [address]

## 2024-02-23 NOTE — Care Management Obs Status (Signed)
 MEDICARE OBSERVATION STATUS NOTIFICATION   Patient Details  Name: Joshua Ali MRN: [id number] Date of Birth: 1940-10-24   Medicare Observation Status Notification Given:       Jon Cruel 02/23/2024, 9:28 AM

## 2024-02-23 NOTE — Progress Notes (Signed)
 Orthopedic Surgery Post-operative Progress Note  Assessment: Patient is a 83 y.o. male who is currently admitted after undergoing L4-S1 TLIF and PSIF   Plan: -Operative plans complete -Needs upright films when able -Drain: none -Out of bed as tolerated, no brace -No bending/lifting/twisting greater than 10 pounds -OT evaluate and treat -Pain control -Diabetic diet -No chemoprophylaxis for dvt or antiplatelets for 72 hours after surgery -Ancef  x2 post-operative doses -Anticipate discharge to home today  ___________________________________________________________________________   Subjective: No acute events overnight. Has been walking the halls multiple times. Leg pain has improved since surgery. Back pan under control with current medications.   Objective:  General: no acute distress, appropriate affect Neurologic: alert, answering questions appropriately, following commands Respiratory: unlabored breathing on room air Skin: dressing clear/dry/intact  MSK (spine):  -Strength exam      Right  Left  EHL    4/5  5/5 TA    5/5  5/5 GSC    5/5  5/5 Knee extension  5/5  5/5 Hip flexion   5/5  5/5  -Sensory exam    Sensation intact to light touch in L3-S1 nerve distributions of bilateral lower extremities   Yesterday's total administered Morphine  Milligram Equivalents: 165   Patient name: Joshua Ali Patient MRN: [id number] Date: 02/23/24

## 2024-02-28 ENCOUNTER — Ambulatory Visit: Payer: Self-pay | Admitting: Cardiovascular Disease

## 2024-03-06 ENCOUNTER — Other Ambulatory Visit

## 2024-03-06 ENCOUNTER — Ambulatory Visit: Admitting: Orthopedic Surgery

## 2024-03-06 ENCOUNTER — Encounter: Payer: Self-pay | Admitting: Radiology

## 2024-03-06 DIAGNOSIS — Z981 Arthrodesis status: Secondary | ICD-10-CM

## 2024-03-06 MED ORDER — METHOCARBAMOL 500 MG PO TABS: 500.0000 mg | ORAL_TABLET | Freq: Four times a day (QID) | ORAL | 1 refills | Status: AC | PRN

## 2024-03-06 MED ORDER — OXYCODONE HCL 5 MG PO TABS: 2.5000 mg | ORAL_TABLET | ORAL | 0 refills | Status: AC | PRN

## 2024-03-06 NOTE — Progress Notes (Signed)
 Orthopedic Surgery Post-operative Office Visit  Procedure: L4-S1 TLIF and PSIF Date of Surgery: 02/22/2024 (~2 weeks post-op)  Assessment: Patient is a 83 y.o. who is doing as expected after surgery   Plan: -Operative plans complete -Out of bed as tolerated, no brace -No bending/lifting/twisting greater than 10 pounds -Pain management: weaning oxycodone  -Okay to let soap/water run over the incision but do not submerge -Return to office in 4 weeks, x-rays needed at next visit: AP/lateral lumbar  ___________________________________________________________________________   Subjective: Patient has been doing well since discharge from the hospital. Leg pain has improved significantly. He does not notice the leg pain. He is still having back pain but it has been getting better with time. He has been decreasing his oxycodone  use. He is down to taking 5mg  every 6 hours or so. Has not noticed any redness or drainage around his incision.   Objective:  General: no acute distress, appropriate affect Neurologic: alert, answering questions appropriately, following commands Respiratory: unlabored breathing on room air Skin: incision is well approximated with no erythema, induration, active/expressible drainage  MSK (spine):  -Strength exam      Left  Right  EHL    5/5  4/5 TA    5/5  5/5 GSC    5/5  5/5 Knee extension  5/5  5/5 Hip flexion   5/5  5/5  -Sensory exam    Sensation intact to light touch in L3-S1 nerve distributions of bilateral lower extremities  Imaging: X-rays of the lumbar spine from 03/06/2024 were independently reviewed and interpreted, showing interbody devices at L4/5 and L5/S1. Interbody device appears to have subsided into the L5 endplate at the L4/5 disc space. No lucency seen around the interbodies. Interbodies appear in appropriate position. There is posterior instrumentation from L4-S1. No lucency seen around the instrumentation. No fracture or dislocation  seen.    Patient name: Joshua Ali Patient MRN: [id number] Date of visit: 03/06/24

## 2024-03-08 ENCOUNTER — Ambulatory Visit: Payer: Self-pay | Admitting: Cardiovascular Disease

## 2024-03-27 ENCOUNTER — Encounter: Payer: Self-pay | Admitting: Orthopedic Surgery

## 2024-03-27 MED ORDER — OXYCODONE HCL 5 MG PO TABS: 2.5000 mg | ORAL_TABLET | Freq: Four times a day (QID) | ORAL | 0 refills | Status: AC | PRN

## 2024-04-03 ENCOUNTER — Ambulatory Visit: Admitting: Orthopedic Surgery

## 2024-04-03 ENCOUNTER — Other Ambulatory Visit

## 2024-04-03 DIAGNOSIS — Z981 Arthrodesis status: Secondary | ICD-10-CM

## 2024-04-03 MED ORDER — OXYCODONE HCL 5 MG PO TABS: 5.0000 mg | ORAL_TABLET | ORAL | 0 refills | Status: AC | PRN

## 2024-04-03 NOTE — Progress Notes (Signed)
 Orthopedic Surgery Post-operative Office Visit   Procedure: L4-S1 TLIF and PSIF Date of Surgery: 02/22/2024 (~6 weeks post-op)   Assessment: Patient is a 83 y.o. who is doing as expected after surgery     Plan: -Operative plans complete -Out of bed as tolerated, no brace -No bending/lifting/twisting greater than 10 pounds -Pain management: weaning oxycodone  -Okay to submerge wound at this point -Can use a heating pad in the mornings to help with his back pain in the mornings -Return to office in 6 weeks, x-rays needed at next visit: AP/lateral/flex/ex lumbar   ___________________________________________________________________________     Subjective: Patient has noticed continued improvement since he was last seen in the office.  He is not having any radiating leg pain.  He does have some back pain.  He notes he notes back pain in the morning.  He has been able to do more lately.  He is walking the dog now.  He notes some soreness near the end of his walk.  He has been able to do more household chores as well but has been following the 10 pound restriction.  He is now just taking an oxycodone  at night to help him sleep.  Objective:   General: no acute distress, appropriate affect Neurologic: alert, answering questions appropriately, following commands Respiratory: unlabored breathing on room air Skin: incision is well healed with no erythema, induration, active/expressible drainage   MSK (spine):   -Strength exam                                                   Left                  Right   EHL                              5/5                  4/5 TA                                 5/5                  5/5 GSC                             5/5                  5/5 Knee extension            5/5                  5/5 Hip flexion                    5/5                  5/5   -Sensory exam                           Sensation intact to light touch in L3-S1 nerve distributions of  bilateral lower extremities   Imaging: XRs of the lumbar spine from 04/03/2024 were independently reviewed and interpreted, showing interbody devices  at L4/5 and L5/S1.  Interbody's appear in appropriate position but the interbodies do show some evidence of subsidence in the L4 inferior endplate and L5 inferior endplate.  No lucency seen around the posterior instrumentation from L4-S1.  No fracture or dislocation seen.    Patient name: Joshua Ali Patient MRN: [id number] Date of visit: 04/03/24

## 2024-04-25 ENCOUNTER — Ambulatory Visit: Payer: Medicare Other

## 2024-04-25 DIAGNOSIS — R55 Syncope and collapse: Secondary | ICD-10-CM

## 2024-04-25 LAB — CUP PACEART REMOTE DEVICE CHECK
Battery Remaining Longevity: 85 mo
Battery Remaining Percentage: 68 %
Battery Voltage: 3.01 V
Brady Statistic AP VP Percent: 1 %
Brady Statistic AP VS Percent: 1 %
Brady Statistic AS VP Percent: 1 %
Brady Statistic AS VS Percent: 99 %
Brady Statistic RA Percent Paced: 1 %
Brady Statistic RV Percent Paced: 1 %
Date Time Interrogation Session: 20251223020015
Implantable Lead Connection Status: 753985
Implantable Lead Connection Status: 753985
Implantable Lead Implant Date: 20100903
Implantable Lead Implant Date: 20100903
Implantable Lead Location: 753859
Implantable Lead Location: 753860
Implantable Pulse Generator Implant Date: 20220325
Lead Channel Impedance Value: 400 Ohm
Lead Channel Impedance Value: 580 Ohm
Lead Channel Pacing Threshold Amplitude: 0.5 V
Lead Channel Pacing Threshold Amplitude: 1.125 V
Lead Channel Pacing Threshold Pulse Width: 0.4 ms
Lead Channel Pacing Threshold Pulse Width: 0.4 ms
Lead Channel Sensing Intrinsic Amplitude: 12 mV
Lead Channel Sensing Intrinsic Amplitude: 4.1 mV
Lead Channel Setting Pacing Amplitude: 1.375
Lead Channel Setting Pacing Amplitude: 1.5 V
Lead Channel Setting Pacing Pulse Width: 0.4 ms
Lead Channel Setting Sensing Sensitivity: 2 mV
Pulse Gen Model: 2272
Pulse Gen Serial Number: 3910054

## 2024-04-26 NOTE — Progress Notes (Signed)
 Remote PPM Transmission

## 2024-05-03 ENCOUNTER — Ambulatory Visit: Payer: Self-pay | Admitting: Cardiovascular Disease

## 2024-05-18 ENCOUNTER — Other Ambulatory Visit (INDEPENDENT_AMBULATORY_CARE_PROVIDER_SITE_OTHER): Payer: Self-pay

## 2024-05-18 ENCOUNTER — Ambulatory Visit: Admitting: Orthopedic Surgery

## 2024-05-18 DIAGNOSIS — Z981 Arthrodesis status: Secondary | ICD-10-CM

## 2024-05-18 MED ORDER — OXYCODONE HCL 5 MG PO TABS: 5.0000 mg | ORAL_TABLET | ORAL | 0 refills | Status: AC | PRN

## 2024-05-18 NOTE — Progress Notes (Signed)
 Orthopedic Surgery Post-operative Office Visit   Procedure: L4-S1 TLIF and PSIF Date of Surgery: 02/22/2024 (~3 months post-op)   Assessment: Patient is a 84 y.o. who is continuing to improve with time     Plan: -No spine specific precautions at this time -Pain management: sparing use of oxycodone  -Can continue to use a heating pad in the mornings to help with his back pain in the mornings. Continue tylenol  and methocarbamol  -Return to office in 3 months, x-rays needed at next visit: AP/lateral/flex/ex lumbar   ___________________________________________________________________________     Subjective: Patient states he has noticed continued improvement in his symptoms since he was last here.  It is slowly getting better.  He still has pain over the lower back in the region of the posterior hips.  He feels it bilaterally in that region.  He has no pain radiating into the lower extremities.  He has noticed improvement in his ability to ambulate.  He is walking longer distances.  He is able to do household tasks now including things like unloading and loading the dishwasher.  He was unable to do any of these tasks prior to surgery so he is pleased, and so is his wife, with this improvement.  He has been using oxycodone  sometimes at night to help him sleep.  Otherwise he has been using Tylenol  and methocarbamol  which have been helping with his pain.   Objective:   General: no acute distress, appropriate affect Neurologic: alert, answering questions appropriately, following commands Respiratory: unlabored breathing on room air Skin: incision is well healed   MSK (spine):   -Strength exam                                                   Left                  Right   EHL                              5/5                  4/5 TA                                 5/5                  5/5 GSC                             5/5                  5/5 Knee extension            5/5                   5/5 Hip flexion                    5/5                  5/5   -Sensory exam                           Sensation intact  to light touch in L3-S1 nerve distributions of bilateral lower extremities   Imaging: XRs of the lumbar spine from 05/18/2024 were independently reviewed and interpreted, showing posterior spinal instrumentation at L4, L5, S1.  No lucency seen around the screws.  None screws backed out.  There are interbody devices at L4/5 and L5/S1.  Interbody's appear in appropriate position and no lucency seen around them. There is subsidence of the cages seen into the endplates. No fracture or dislocation seen. No evidence of instability on flexion/extension views.      Patient name: Joshua Ali Patient MRN: [id number] Date of visit: 05/18/24

## 2024-08-16 ENCOUNTER — Ambulatory Visit: Admitting: Orthopedic Surgery
